# Patient Record
Sex: Male | Born: 1935 | Race: White | Hispanic: No | Marital: Married | Smoking: Former smoker
Health system: Southern US, Community
[De-identification: ages and names within clinical notes are randomized; demographics above are authoritative.]

## PROBLEM LIST (undated history)

## (undated) DIAGNOSIS — I10 Essential (primary) hypertension: Secondary | ICD-10-CM

## (undated) DIAGNOSIS — R5381 Other malaise: Secondary | ICD-10-CM

## (undated) DIAGNOSIS — C189 Malignant neoplasm of colon, unspecified: Secondary | ICD-10-CM

## (undated) DIAGNOSIS — E785 Hyperlipidemia, unspecified: Secondary | ICD-10-CM

## (undated) DIAGNOSIS — E039 Hypothyroidism, unspecified: Secondary | ICD-10-CM

## (undated) DIAGNOSIS — G610 Guillain-Barre syndrome: Secondary | ICD-10-CM

## (undated) DIAGNOSIS — L851 Acquired keratosis [keratoderma] palmaris et plantaris: Secondary | ICD-10-CM

## (undated) DIAGNOSIS — K219 Gastro-esophageal reflux disease without esophagitis: Secondary | ICD-10-CM

## (undated) DIAGNOSIS — R5383 Other fatigue: Secondary | ICD-10-CM

## (undated) DIAGNOSIS — D649 Anemia, unspecified: Secondary | ICD-10-CM

## (undated) DIAGNOSIS — D126 Benign neoplasm of colon, unspecified: Secondary | ICD-10-CM

## (undated) DIAGNOSIS — K5792 Diverticulitis of intestine, part unspecified, without perforation or abscess without bleeding: Secondary | ICD-10-CM

## (undated) HISTORY — DX: Gastro-esophageal reflux disease without esophagitis: K21.9

## (undated) HISTORY — DX: Diverticulitis of intestine, part unspecified, without perforation or abscess without bleeding: K57.92

## (undated) HISTORY — DX: Essential (primary) hypertension: I10

## (undated) HISTORY — DX: Hypothyroidism, unspecified: E03.9

## (undated) HISTORY — DX: Acquired keratosis (keratoderma) palmaris et plantaris: L85.1

## (undated) HISTORY — DX: Malignant neoplasm of colon, unspecified: C18.9

## (undated) HISTORY — DX: Guillain-Barre syndrome: G61.0

## (undated) HISTORY — PX: HERNIA REPAIR: SHX51

## (undated) HISTORY — DX: Anemia, unspecified: D64.9

## (undated) HISTORY — PX: HEMORRHOID SURGERY: SHX153

## (undated) HISTORY — PX: OMENTECTOMY: SHX2098

## (undated) HISTORY — DX: Other malaise: R53.81

## (undated) HISTORY — DX: Other fatigue: R53.83

## (undated) HISTORY — DX: Hyperlipidemia, unspecified: E78.5

## (undated) HISTORY — PX: CATARACT EXTRACTION: SUR2

## (undated) HISTORY — DX: Benign neoplasm of colon, unspecified: D12.6

---
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
## 1966-01-16 HISTORY — PX: CERVICAL LAMINECTOMY: SHX94

## 1968-01-17 HISTORY — PX: LUMBAR LAMINECTOMY: SHX95

## 2004-09-26 ENCOUNTER — Ambulatory Visit (HOSPITAL_COMMUNITY): Admission: RE | Admit: 2004-09-26 | Discharge: 2004-09-26 | Payer: Self-pay | Admitting: General Surgery

## 2008-05-16 DIAGNOSIS — C189 Malignant neoplasm of colon, unspecified: Secondary | ICD-10-CM

## 2008-05-16 HISTORY — DX: Malignant neoplasm of colon, unspecified: C18.9

## 2008-05-20 ENCOUNTER — Ambulatory Visit: Payer: Self-pay | Admitting: Family Medicine

## 2008-05-20 DIAGNOSIS — E785 Hyperlipidemia, unspecified: Secondary | ICD-10-CM

## 2008-05-20 DIAGNOSIS — I10 Essential (primary) hypertension: Secondary | ICD-10-CM | POA: Insufficient documentation

## 2008-05-20 DIAGNOSIS — D649 Anemia, unspecified: Secondary | ICD-10-CM | POA: Insufficient documentation

## 2008-05-20 DIAGNOSIS — E039 Hypothyroidism, unspecified: Secondary | ICD-10-CM | POA: Insufficient documentation

## 2008-05-20 DIAGNOSIS — K921 Melena: Secondary | ICD-10-CM | POA: Insufficient documentation

## 2008-05-20 HISTORY — DX: Hyperlipidemia, unspecified: E78.5

## 2008-05-20 HISTORY — DX: Essential (primary) hypertension: I10

## 2008-05-20 LAB — CONVERTED CEMR LAB
ALT: 13 units/L (ref 0–53)
AST: 20 units/L (ref 0–37)
Alkaline Phosphatase: 97 units/L (ref 39–117)
BUN: 25 mg/dL — ABNORMAL HIGH (ref 6–23)
Basophils Absolute: 0.1 10*3/uL (ref 0.0–0.1)
CO2: 26 meq/L (ref 19–32)
Calcium: 8.7 mg/dL (ref 8.4–10.5)
Chloride: 112 meq/L (ref 96–112)
Creatinine, Ser: 1 mg/dL (ref 0.4–1.5)
Eosinophils Absolute: 0.4 10*3/uL (ref 0.0–0.7)
Eosinophils Relative: 5.4 % — ABNORMAL HIGH (ref 0.0–5.0)
Glucose, Bld: 89 mg/dL (ref 70–99)
HCT: 27.9 % — ABNORMAL LOW (ref 39.0–52.0)
HDL: 47.8 mg/dL (ref >39.00)
LDL Cholesterol: 72 mg/dL (ref 0–99)
Lymphocytes Relative: 22.2 % (ref 12.0–46.0)
Monocytes Absolute: 0.9 10*3/uL (ref 0.1–1.0)
Monocytes Relative: 12.5 % — ABNORMAL HIGH (ref 3.0–12.0)
Neutrophils Relative %: 58.8 % (ref 43.0–77.0)
Sodium: 143 meq/L (ref 135–145)
TSH: 5.99 microintl units/mL — ABNORMAL HIGH (ref 0.35–5.50)
Total Bilirubin: 0.5 mg/dL (ref 0.3–1.2)

## 2008-05-22 ENCOUNTER — Ambulatory Visit: Payer: Self-pay | Admitting: Family Medicine

## 2008-05-25 ENCOUNTER — Telehealth: Payer: Self-pay | Admitting: Gastroenterology

## 2008-05-25 ENCOUNTER — Ambulatory Visit: Payer: Self-pay | Admitting: Gastroenterology

## 2008-05-25 DIAGNOSIS — R195 Other fecal abnormalities: Secondary | ICD-10-CM | POA: Insufficient documentation

## 2008-05-25 DIAGNOSIS — Z860100 Personal history of colon polyps, unspecified: Secondary | ICD-10-CM | POA: Insufficient documentation

## 2008-05-25 DIAGNOSIS — K219 Gastro-esophageal reflux disease without esophagitis: Secondary | ICD-10-CM

## 2008-05-25 DIAGNOSIS — D509 Iron deficiency anemia, unspecified: Secondary | ICD-10-CM | POA: Insufficient documentation

## 2008-05-25 HISTORY — DX: Gastro-esophageal reflux disease without esophagitis: K21.9

## 2008-05-25 LAB — CONVERTED CEMR LAB: Ferritin: 10 ng/mL — ABNORMAL LOW (ref 22.0–322.0)

## 2008-05-26 ENCOUNTER — Telehealth: Payer: Self-pay | Admitting: Gastroenterology

## 2008-06-03 ENCOUNTER — Encounter: Payer: Self-pay | Admitting: Gastroenterology

## 2008-06-03 ENCOUNTER — Ambulatory Visit: Payer: Self-pay | Admitting: Gastroenterology

## 2008-06-04 ENCOUNTER — Telehealth: Payer: Self-pay | Admitting: Gastroenterology

## 2008-06-04 DIAGNOSIS — C184 Malignant neoplasm of transverse colon: Secondary | ICD-10-CM | POA: Insufficient documentation

## 2008-06-09 ENCOUNTER — Ambulatory Visit: Payer: Self-pay | Admitting: Cardiology

## 2008-06-12 ENCOUNTER — Telehealth: Payer: Self-pay | Admitting: Gastroenterology

## 2008-06-12 ENCOUNTER — Ambulatory Visit: Payer: Self-pay | Admitting: Gastroenterology

## 2008-06-12 LAB — CONVERTED CEMR LAB
Basophils Absolute: 0.1 10*3/uL (ref 0.0–0.1)
Eosinophils Absolute: 0.5 10*3/uL (ref 0.0–0.7)
Hemoglobin: 8.8 g/dL — ABNORMAL LOW (ref 13.0–17.0)
Lymphs Abs: 1.6 10*3/uL (ref 0.7–4.0)
MCHC: 32.2 g/dL (ref 30.0–36.0)
MCV: 80.3 fL (ref 78.0–100.0)
Monocytes Absolute: 0.9 10*3/uL (ref 0.1–1.0)
Neutro Abs: 4.6 10*3/uL (ref 1.4–7.7)
Neutrophils Relative %: 60.2 % (ref 43.0–77.0)
RDW: 15.2 % — ABNORMAL HIGH (ref 11.5–14.6)
WBC: 7.7 10*3/uL (ref 4.5–10.5)

## 2008-06-16 ENCOUNTER — Encounter (HOSPITAL_COMMUNITY): Admission: RE | Admit: 2008-06-16 | Discharge: 2008-09-01 | Payer: Self-pay | Admitting: Gastroenterology

## 2008-06-23 ENCOUNTER — Encounter: Payer: Self-pay | Admitting: Gastroenterology

## 2008-07-03 ENCOUNTER — Ambulatory Visit: Payer: Self-pay | Admitting: Gastroenterology

## 2008-07-03 ENCOUNTER — Telehealth: Payer: Self-pay | Admitting: Gastroenterology

## 2008-07-03 LAB — CONVERTED CEMR LAB
Basophils Absolute: 0.1 10*3/uL (ref 0.0–0.1)
Basophils Relative: 0.7 % (ref 0.0–3.0)
Eosinophils Relative: 4.5 % (ref 0.0–5.0)
HCT: 34.8 % — ABNORMAL LOW (ref 39.0–52.0)
Hemoglobin: 11.7 g/dL — ABNORMAL LOW (ref 13.0–17.0)
Lymphs Abs: 1.7 10*3/uL (ref 0.7–4.0)
MCHC: 33.5 g/dL (ref 30.0–36.0)
MCV: 83.2 fL (ref 78.0–100.0)
Monocytes Absolute: 1.2 10*3/uL — ABNORMAL HIGH (ref 0.1–1.0)
Neutrophils Relative %: 60.6 % (ref 43.0–77.0)
Platelets: 334 10*3/uL (ref 150.0–400.0)
RBC: 4.18 M/uL — ABNORMAL LOW (ref 4.22–5.81)

## 2008-07-13 ENCOUNTER — Ambulatory Visit: Payer: Self-pay | Admitting: Family Medicine

## 2008-07-16 HISTORY — PX: COLECTOMY: SHX59

## 2008-07-21 ENCOUNTER — Encounter (INDEPENDENT_AMBULATORY_CARE_PROVIDER_SITE_OTHER): Payer: Self-pay | Admitting: General Surgery

## 2008-07-21 ENCOUNTER — Encounter: Payer: Self-pay | Admitting: Gastroenterology

## 2008-07-21 ENCOUNTER — Inpatient Hospital Stay (HOSPITAL_COMMUNITY): Admission: RE | Admit: 2008-07-21 | Discharge: 2008-07-29 | Payer: Self-pay | Admitting: General Surgery

## 2008-08-24 ENCOUNTER — Ambulatory Visit: Payer: Self-pay | Admitting: Oncology

## 2008-08-25 ENCOUNTER — Encounter: Payer: Self-pay | Admitting: Gastroenterology

## 2008-08-25 LAB — COMPREHENSIVE METABOLIC PANEL
ALT: 20 U/L (ref 0–53)
AST: 17 U/L (ref 0–37)
Alkaline Phosphatase: 100 U/L (ref 39–117)
Sodium: 136 mEq/L (ref 135–145)
Total Bilirubin: 0.4 mg/dL (ref 0.3–1.2)
Total Protein: 7.4 g/dL (ref 6.0–8.3)

## 2008-08-25 LAB — CBC WITH DIFFERENTIAL/PLATELET
BASO%: 0.6 % (ref 0.0–2.0)
HCT: 35.7 % — ABNORMAL LOW (ref 38.4–49.9)
MCHC: 32.2 g/dL (ref 32.0–36.0)
MONO#: 0.9 10*3/uL (ref 0.1–0.9)
NEUT#: 3.8 10*3/uL (ref 1.5–6.5)
NEUT%: 55.8 % (ref 39.0–75.0)
RBC: 4.14 10*6/uL — ABNORMAL LOW (ref 4.20–5.82)
WBC: 6.9 10*3/uL (ref 4.0–10.3)
lymph#: 1.8 10*3/uL (ref 0.9–3.3)

## 2008-09-23 ENCOUNTER — Encounter (INDEPENDENT_AMBULATORY_CARE_PROVIDER_SITE_OTHER): Payer: Self-pay | Admitting: Occupational Medicine

## 2008-10-14 ENCOUNTER — Ambulatory Visit: Payer: Self-pay | Admitting: Family Medicine

## 2008-10-14 LAB — CONVERTED CEMR LAB
ALT: 14 units/L (ref 0–53)
AST: 21 units/L (ref 0–37)
Albumin: 3.6 g/dL (ref 3.5–5.2)
Alkaline Phosphatase: 92 units/L (ref 39–117)
Basophils Relative: 0.5 % (ref 0.0–3.0)
Chloride: 107 meq/L (ref 96–112)
Creatinine, Ser: 1 mg/dL (ref 0.4–1.5)
Eosinophils Absolute: 0.2 10*3/uL (ref 0.0–0.7)
GFR calc non Af Amer: 76.37 mL/min (ref >60)
Glucose, Bld: 89 mg/dL (ref 70–99)
Lymphs Abs: 1.8 10*3/uL (ref 0.7–4.0)
MCHC: 33.8 g/dL (ref 30.0–36.0)
MCV: 90 fL (ref 78.0–100.0)
Neutro Abs: 2.9 10*3/uL (ref 1.4–7.7)
Neutrophils Relative %: 51.4 % (ref 43.0–77.0)
Platelets: 312 10*3/uL (ref 150.0–400.0)
RBC: 3.86 M/uL — ABNORMAL LOW (ref 4.22–5.81)
RDW: 14 % (ref 11.5–14.6)
Sodium: 138 meq/L (ref 135–145)
TSH: 33.53 microintl units/mL — ABNORMAL HIGH (ref 0.35–5.50)
Total Bilirubin: 0.8 mg/dL (ref 0.3–1.2)
WBC: 5.6 10*3/uL (ref 4.5–10.5)

## 2008-10-21 ENCOUNTER — Ambulatory Visit: Payer: Self-pay | Admitting: Family Medicine

## 2008-10-21 DIAGNOSIS — L851 Acquired keratosis [keratoderma] palmaris et plantaris: Secondary | ICD-10-CM | POA: Insufficient documentation

## 2008-10-21 HISTORY — DX: Acquired keratosis (keratoderma) palmaris et plantaris: L85.1

## 2008-12-11 ENCOUNTER — Ambulatory Visit: Payer: Self-pay | Admitting: Oncology

## 2008-12-15 ENCOUNTER — Encounter: Payer: Self-pay | Admitting: Gastroenterology

## 2008-12-15 LAB — COMPREHENSIVE METABOLIC PANEL
BUN: 21 mg/dL (ref 6–23)
CO2: 23 mEq/L (ref 19–32)
Glucose, Bld: 86 mg/dL (ref 70–99)
Sodium: 140 mEq/L (ref 135–145)
Total Bilirubin: 0.6 mg/dL (ref 0.3–1.2)
Total Protein: 7.3 g/dL (ref 6.0–8.3)

## 2008-12-15 LAB — CBC WITH DIFFERENTIAL/PLATELET
Basophils Absolute: 0 10*3/uL (ref 0.0–0.1)
Eosinophils Absolute: 0.2 10*3/uL (ref 0.0–0.5)
HCT: 35.1 % — ABNORMAL LOW (ref 38.4–49.9)
HGB: 11.6 g/dL — ABNORMAL LOW (ref 13.0–17.1)
LYMPH%: 33.4 % (ref 14.0–49.0)
MCV: 88.6 fL (ref 79.3–98.0)
MONO#: 0.8 10*3/uL (ref 0.1–0.9)
NEUT#: 4.2 10*3/uL (ref 1.5–6.5)
NEUT%: 53.5 % (ref 39.0–75.0)
Platelets: 334 10*3/uL (ref 140–400)
RBC: 3.96 10*6/uL — ABNORMAL LOW (ref 4.20–5.82)
WBC: 7.8 10*3/uL (ref 4.0–10.3)

## 2008-12-15 LAB — CEA: CEA: 2.3 ng/mL (ref 0.0–5.0)

## 2009-01-27 ENCOUNTER — Encounter: Payer: Self-pay | Admitting: Gastroenterology

## 2009-03-01 ENCOUNTER — Ambulatory Visit: Payer: Self-pay | Admitting: Oncology

## 2009-03-03 ENCOUNTER — Ambulatory Visit (HOSPITAL_COMMUNITY): Admission: RE | Admit: 2009-03-03 | Discharge: 2009-03-03 | Payer: Self-pay | Admitting: Oncology

## 2009-03-03 LAB — COMPREHENSIVE METABOLIC PANEL
Alkaline Phosphatase: 90 U/L (ref 39–117)
BUN: 20 mg/dL (ref 6–23)
CO2: 25 mEq/L (ref 19–32)
Chloride: 105 mEq/L (ref 96–112)
Sodium: 136 mEq/L (ref 135–145)
Total Protein: 7.7 g/dL (ref 6.0–8.3)

## 2009-03-03 LAB — CBC WITH DIFFERENTIAL/PLATELET
BASO%: 0.6 % (ref 0.0–2.0)
EOS%: 3.9 % (ref 0.0–7.0)
HCT: 34.4 % — ABNORMAL LOW (ref 38.4–49.9)
HGB: 11.6 g/dL — ABNORMAL LOW (ref 13.0–17.1)
LYMPH%: 25.9 % (ref 14.0–49.0)
MCH: 30.1 pg (ref 27.2–33.4)
NEUT#: 3.7 10*3/uL (ref 1.5–6.5)
Platelets: 303 10*3/uL (ref 140–400)
RDW: 15 % — ABNORMAL HIGH (ref 11.0–14.6)

## 2009-03-03 LAB — CEA: CEA: 3.2 ng/mL (ref 0.0–5.0)

## 2009-03-24 ENCOUNTER — Encounter: Payer: Self-pay | Admitting: Gastroenterology

## 2009-04-26 ENCOUNTER — Encounter: Payer: Self-pay | Admitting: Gastroenterology

## 2009-05-13 ENCOUNTER — Ambulatory Visit: Payer: Self-pay | Admitting: Family Medicine

## 2009-05-13 DIAGNOSIS — R5381 Other malaise: Secondary | ICD-10-CM | POA: Insufficient documentation

## 2009-05-13 DIAGNOSIS — R5383 Other fatigue: Secondary | ICD-10-CM

## 2009-05-13 HISTORY — DX: Other malaise: R53.81

## 2009-05-17 LAB — CONVERTED CEMR LAB
Basophils Absolute: 0 10*3/uL (ref 0.0–0.1)
CEA: 4.6 ng/mL (ref 0.0–5.0)
CO2: 30 meq/L (ref 19–32)
Eosinophils Absolute: 0.3 10*3/uL (ref 0.0–0.7)
Eosinophils Relative: 3.7 % (ref 0.0–5.0)
Glucose, Bld: 82 mg/dL (ref 70–99)
HCT: 35.4 % — ABNORMAL LOW (ref 39.0–52.0)
Monocytes Relative: 11.1 % (ref 3.0–12.0)
Platelets: 320 10*3/uL (ref 150.0–400.0)
RBC: 3.83 M/uL — ABNORMAL LOW (ref 4.22–5.81)
RDW: 15.6 % — ABNORMAL HIGH (ref 11.5–14.6)
Sodium: 140 meq/L (ref 135–145)
TSH: 7.3 microintl units/mL — ABNORMAL HIGH (ref 0.35–5.50)
WBC: 7.1 10*3/uL (ref 4.5–10.5)

## 2009-06-02 ENCOUNTER — Ambulatory Visit: Payer: Self-pay | Admitting: Family Medicine

## 2009-06-08 ENCOUNTER — Encounter (INDEPENDENT_AMBULATORY_CARE_PROVIDER_SITE_OTHER): Payer: Self-pay | Admitting: *Deleted

## 2009-08-02 ENCOUNTER — Ambulatory Visit: Payer: Self-pay | Admitting: Oncology

## 2009-08-04 ENCOUNTER — Ambulatory Visit: Payer: Self-pay | Admitting: Family Medicine

## 2009-08-04 DIAGNOSIS — K432 Incisional hernia without obstruction or gangrene: Secondary | ICD-10-CM | POA: Insufficient documentation

## 2009-08-04 DIAGNOSIS — I951 Orthostatic hypotension: Secondary | ICD-10-CM | POA: Insufficient documentation

## 2009-08-25 ENCOUNTER — Ambulatory Visit: Payer: Self-pay | Admitting: Family Medicine

## 2009-08-26 LAB — CONVERTED CEMR LAB: TSH: 18.87 microintl units/mL — ABNORMAL HIGH (ref 0.35–5.50)

## 2009-09-01 ENCOUNTER — Ambulatory Visit: Payer: Self-pay | Admitting: Family Medicine

## 2009-09-22 ENCOUNTER — Telehealth: Payer: Self-pay | Admitting: Gastroenterology

## 2009-09-22 ENCOUNTER — Ambulatory Visit: Payer: Self-pay | Admitting: Gastroenterology

## 2009-09-22 DIAGNOSIS — Z85038 Personal history of other malignant neoplasm of large intestine: Secondary | ICD-10-CM | POA: Insufficient documentation

## 2009-10-16 DIAGNOSIS — D126 Benign neoplasm of colon, unspecified: Secondary | ICD-10-CM

## 2009-10-16 HISTORY — DX: Benign neoplasm of colon, unspecified: D12.6

## 2009-10-22 ENCOUNTER — Telehealth: Payer: Self-pay | Admitting: Gastroenterology

## 2009-10-22 ENCOUNTER — Telehealth: Payer: Self-pay | Admitting: Family Medicine

## 2009-10-26 ENCOUNTER — Ambulatory Visit: Payer: Self-pay | Admitting: Gastroenterology

## 2009-10-31 ENCOUNTER — Encounter: Payer: Self-pay | Admitting: Gastroenterology

## 2009-12-15 ENCOUNTER — Ambulatory Visit: Payer: Self-pay | Admitting: Family Medicine

## 2009-12-16 LAB — CONVERTED CEMR LAB: TSH: 11.34 microintl units/mL — ABNORMAL HIGH (ref 0.35–5.50)

## 2009-12-22 ENCOUNTER — Ambulatory Visit: Payer: Self-pay | Admitting: Family Medicine

## 2010-02-06 ENCOUNTER — Encounter: Payer: Self-pay | Admitting: Oncology

## 2010-02-15 NOTE — Progress Notes (Signed)
Summary: Switch prep   Phone Note Outgoing Call Call back at Work Phone [phone]   Call placed by: Christie Nottingham CMA Duncan Dull),  September 22, 2009 2:24 PM Call placed to: Patient Summary of Call: Called pt to tell him that we recieved the fax from his pharmacy stating that his insurance will not cover the Movi prep and he wanted a substituion. I called the pt and told him that we have mail in rebate coupon for the Movi prep or we could switch him to a Golytely prep. Pt states he has done the Golytely prep in the past and would like that prep instead. Informed pharmacy of the change in prep and mailed the new prep instructions to the pt. Told the pt to call back if he has any questions about the new prep instructions.  Initial call taken by: Christie Nottingham CMA Duncan Dull),  September 22, 2009 2:28 PM

## 2010-02-15 NOTE — Procedures (Signed)
Summary: Colonoscopy  Patient: Ronald Gutierrez Note: All result statuses are Final unless otherwise noted.  Tests: (1) Colonoscopy (COL)   COL Colonoscopy           DONE     [facility]     [address]     [place], [place]  [address]           COLONOSCOPY PROCEDURE REPORT     PATIENT:  Ronald Gutierrez  MR#:  #[id number]     BIRTHDATE:  09-Feb-1926, 83 yrs. old  GENDER:  male     ENDOSCOPIST:  Judie Petit T. Russella Dar, MD, [facility]           PROCEDURE DATE:  10/26/2009     PROCEDURE:  Colonoscopy with snare polypectomy, with submucosal     injection     ASA CLASS:  Class II     INDICATIONS:  follow-up of cancer , surveillance and high-risk     screening: T4 colon adenocarcinoma, 05/2008.     MEDICATIONS:   Fentanyl 50 mcg IV, Versed 8 mg IV     DESCRIPTION OF PROCEDURE:   After the risks benefits and     alternatives of the procedure were thoroughly explained, informed     consent was obtained.  Digital rectal exam was performed and     revealed no abnormalities.   The LB [id number]-[id number] endoscope     was introduced through the anus and advanced to the cecum, which     was identified by both the appendix and ileocecal valve, limited     by fair prep.    The quality of the prep was Moviprep fair.  The     instrument was then slowly withdrawn as the colon was fully     examined.     <<PROCEDUREIMAGES>>     FINDINGS:  A sessile polyp was found at the hepatic flexure. It     was 15 mm in size. Polyp was snared, then cauterized with     monopolar cautery. Retrieval was successful. Piecemeal     polypectomy.  Normal colon otherwise at the hepatic flexure with 2     cc submucosal injection [place] ink tattoo adjacent to the     polypectomy site.  Four polyps were found in the proximal     transverse colon. They were 4 - 12 mm in size. Polyps were snared,     then cauterized with monopolar cautery. Retrieval was successful.     There was evidence of a prior segmental colectomy in the mid  transverse colon.  Moderate diverticulosis was found in the     sigmoid to descending colon.  A sessile polyp was found in the     rectum. It was 5 mm in size. Polyp was snared without cautery.     Retrieval was successful.  This was otherwise a normal examination     of the colon.  Retroflexed views in the rectum revealed internal     hemorrhoids     , moderate.  The time to cecum =  1.75  minutes. The scope was     then withdrawn (time =  18  min) from the patient and the     procedure completed.           COMPLICATIONS:  None           ENDOSCOPIC IMPRESSION:     1) 15 mm sessile polyp at the hepatic flexure     2)  Internal hemorrhoids     3) 4 - 12 mm Four polyps in the proximal transverse colon     4) Prior segmental colectomy in the mid transverse colon     5) Moderate diverticulosis in the sigmoid to descending colon     6) 5 mm sessile polyp in the rectum           RECOMMENDATIONS:     1) Await pathology results     2) High fiber diet with liberal fluid intake.     3) Repeat Colonoscopy in 1 year pending pathology review.     4) No ASA/NSAIDs for 2 weeks           Malcolm T. Russella Dar, MD, [facility]           CC: Evelena Peat, MD           n.     Rosalie DoctorVenita Lick. Stark at 10/26/2009 03:12 PM           Margarita Rana, #[id number]  Note: An exclamation mark (!) indicates a result that was not dispersed into the flowsheet. Document Creation Date: 10/26/2009 3:13 PM _______________________________________________________________________  (1) Order result status: Final Collection or observation date-time: 10/26/2009 15:05 Requested date-time:  Receipt date-time:  Reported date-time:  Referring Physician:   Ordering Physician: Claudette Head [phone]) Specimen Source:  Source: Launa Grill Order Number: [id number] Lab site:   Appended Document: Colonoscopy     Procedures Next Due Date:    Colonoscopy: 10/2010

## 2010-02-15 NOTE — Letter (Signed)
Summary: [facility] Instructions  [facility]  [address] [place], [place] [address]   Phone: [phone]  Fax: [phone]       Ronald Gutierrez    28-Nov-1926    MRN: [id number]        Procedure Day /Date: Tuesday October 11th, 2011     Arrival Time: 1:30pm     Procedure Time: 2:30pm     Location of Procedure:                    _ x_  [facility] (4th Floor)                         PREPARATION FOR COLONOSCOPY WITH MOVIPREP   Starting 5 days prior to your procedure 10/21/09 do not eat nuts, seeds, popcorn, corn, beans, peas,  salads, or any raw vegetables.  Do not take any fiber supplements (e.g. Metamucil, Citrucel, and Benefiber).  THE DAY BEFORE YOUR PROCEDURE         DATE: 10/25/09  DAY: Monday  1.  Drink clear liquids the entire day-NO SOLID FOOD  2.  Do not drink anything colored red or purple.  Avoid juices with pulp.  No orange juice.  3.  Drink at least 64 oz. (8 glasses) of fluid/clear liquids during the day to prevent dehydration and help the prep work efficiently.  CLEAR LIQUIDS INCLUDE: Water Jello Ice Popsicles Tea (sugar ok, no milk/cream) Powdered fruit flavored drinks Coffee (sugar ok, no milk/cream) Gatorade Juice: apple, white grape, white cranberry  Lemonade Clear bullion, consomm, broth Carbonated beverages (any kind) Strained chicken noodle soup Hard Candy                             4.  In the morning, mix first dose of MoviPrep solution:    Empty 1 Pouch A and 1 Pouch B into the disposable container    Add lukewarm drinking water to the top line of the container. Mix to dissolve    Refrigerate (mixed solution should be used within 24 hrs)  5.  Begin drinking the prep at 5:00 p.m. The MoviPrep container is divided by 4 marks.   Every 15 minutes drink the solution down to the next mark (approximately 8 oz) until the full liter is complete.   6.  Follow completed prep with 16 oz of clear liquid of your choice  (Nothing red or purple).  Continue to drink clear liquids until bedtime.  7.  Before going to bed, mix second dose of MoviPrep solution:    Empty 1 Pouch A and 1 Pouch B into the disposable container    Add lukewarm drinking water to the top line of the container. Mix to dissolve    Refrigerate  THE DAY OF YOUR PROCEDURE      DATE: 10/26/09 DAY: Tuesday  Beginning at 9:30a.m. (5 hours before procedure):         1. Every 15 minutes, drink the solution down to the next mark (approx 8 oz) until the full liter is complete.  2. Follow completed prep with 16 oz. of clear liquid of your choice.    3. You may drink clear liquids until 12:30pm (2 HOURS BEFORE PROCEDURE).   MEDICATION INSTRUCTIONS  Unless otherwise instructed, you should take regular prescription medications with a small sip of water   as early as possible the morning of your  procedure.         OTHER INSTRUCTIONS  You will need a responsible adult at least 76 years of age to accompany you and drive you home.   This person must remain in the waiting room during your procedure.  Wear loose fitting clothing that is easily removed.  Leave jewelry and other valuables at home.  However, you may wish to bring a book to read or  an iPod/MP3 player to listen to music as you wait for your procedure to start.  Remove all body piercing jewelry and leave at home.  Total time from sign-in until discharge is approximately 2-3 hours.  You should go home directly after your procedure and rest.  You can resume normal activities the  day after your procedure.  The day of your procedure you should not:   Drive   Make legal decisions   Operate machinery   Drink alcohol   Return to work  You will receive specific instructions about eating, activities and medications before you leave.    The above instructions have been reviewed and explained to me by   Estill Bamberg.     I fully understand and can verbalize these  instructions _____________________________ Date _________

## 2010-02-15 NOTE — Assessment & Plan Note (Signed)
Summary: 2 WK ROV // RS   Vital Signs:  Patient profile:   77 year old male Weight:      196 pounds Temp:     97.7 degrees F oral BP sitting:   140 / 80  (left arm) Cuff size:   regular  Vitals Entered By: Sid Falcon LPN (Jun 02, 2009 8:24 AM) CC: 2 week follow-up   History of Present Illness: Follow up fatigue.  TSH was high.  B12 normal.  CEA < 5.   Hgb 11.9 with normal MCV. Appetite and weight stable.  Occ constipation.  Has actually had some steady weight gain since colon surgery.  Thyroid med increased and fatigue somewhat improved.  Golf 4 times/week.  No chest pain or dyspnea.  Pt needs to be scheduled for repeat colonoscopy.  Allergies: 1)  ! Ferrous Sulfate (Ferrous Sulfate) 2)  Lead Oxide (Lead Oxide)  Past History:  Past Medical History: Last updated: 10/21/2008 Hyperlipidemia Hypertension Hypothyroidism Colon polyps Anemia  2009 Diverticulitis Allergy/Hay Fever GERD Colon Cancer 5/10 T4N0 HX Guillain Barre' Syndrome  Past Surgical History: Last updated: 05/13/2009 Inguinal herniorrhaphy X 5 Appendectomy 1948 Cervical laminectomy  1968 Lumbar laminectomy   1970 Crushed left heel 1985 Hemorrhoid surgery Partial Colectomy 7/10 Transverse Colon  Review of Systems  The patient denies anorexia, weight loss, abdominal pain, melena, hematochezia, and severe indigestion/heartburn.    Physical Exam  General:  Well-developed,well-nourished,in no acute distress; alert,appropriate and cooperative throughout examination Neck:  No deformities, masses, or tenderness noted. Lungs:  Normal respiratory effort, chest expands symmetrically. Lungs are clear to auscultation, no crackles or wheezes. Heart:  Normal rate and regular rhythm. S1 and S2 normal without gallop, murmur, click, rub or other extra sounds. Extremities:  No clubbing, cyanosis, edema, or deformity noted with normal full range of motion of all joints.     Impression &  Recommendations:  Problem # 1:  HYPOTHYROIDISM (ICD-244.9) repeat TSH in 3 months. The following medications were removed from the medication list:    Levothroid 112 Mcg Tabs (Levothyroxine sodium) ..... One by mouth once daily His updated medication list for this problem includes:    Levothroid 125 Mcg Tabs (Levothyroxine sodium) ..... 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 Orders: Gastroenterology Referral (GI)  Complete Medication List: 1)  Lisinopril 40 Mg Tabs (Lisinopril) .... Once daily 2)  Simvastatin 40 Mg Tabs (Simvastatin) .... One half by mouth once daily 3)  Cvs Loratadine 10 Mg Tabs (Loratadine) .... Once daily 4)  Adult Aspirin Low Strength 81 Mg Tbdp (Aspirin) .... Once daily 5)  Centrum Ultra Mens Tabs (Multiple vitamins-minerals) .... Once daily 6)  Omeprazole 20 Mg Cpdr (Omeprazole) .... One tablet by mouth once daily 7)  Repliva 21/7  .... One tablet by mouth once daily after a meal. 8)  Levothroid 125 Mcg Tabs (Levothyroxine sodium) .... Once daily  Patient Instructions: 1)  Please schedule a follow-up appointment in 3 months .  2)  TSH prior to visit ICD-9 : 244.9

## 2010-02-15 NOTE — Procedures (Signed)
Summary: Recall Assessment/[facility]  Recall Assessment/[facility]   Imported By: Sherian Rein 08/31/2009 10:12:33  _____________________________________________________________________  External Attachment:    Type:   Image     Comment:   External Document

## 2010-02-15 NOTE — Assessment & Plan Note (Signed)
Summary: 3 month ov//ccm   Vital Signs:  Patient profile:   77 year old male Weight:      200 pounds Temp:     98 degrees F oral BP sitting:   140 / 80  (left arm) Cuff size:   regular  Vitals Entered By: Sid Falcon LPN (September 01, 2009 8:21 AM)  History of Present Illness: Patient seen for the following  Patient has history of hypertension. Had mild orthostatic symptoms on lisinopril 40 mg. Dosage reduced to 20 mg. Resolution of orthostatic symptoms. Drinking plenty of fluids. Played golf 5 times last week. Blood pressures reviewed and home readings are stable. Mostly 120s to 130 systolic. Has taken blood pressure standing on a couple occasions and this has not dropped any.  Hypothyroidism under replaced. Recent titration of Levothroid to 137 micrograms. Overall his fatigue is improving. Good appetite. Weight stable.  History colon cancer. Scheduled colonoscopy in 2 weeks. Occasional dark stools but no bloody stools Incisional hernia slightly larger in size recently.  Occ fleeting pain with golf-?related to overstretching scar tissue.    Allergies: 1)  ! Ferrous Sulfate (Ferrous Sulfate) 2)  Lead Oxide (Lead Oxide)  Past History:  Past Medical History: Last updated: 10/21/2008 Hyperlipidemia Hypertension Hypothyroidism Colon polyps Anemia  2009 Diverticulitis Allergy/Hay Fever GERD Colon Cancer 5/10 T4N0 HX Guillain Barre' Syndrome  Past Surgical History: Last updated: 05/13/2009 Inguinal herniorrhaphy X 5 Appendectomy 1948 Cervical laminectomy  1968 Lumbar laminectomy   1970 Crushed left heel 1985 Hemorrhoid surgery Partial Colectomy 7/10 Transverse Colon  Family History: Last updated: 05/25/2008 Stroke, father Lung cancer, older brother Heart disease, 2 brothers Family History of Prostate Cancer:2 brothers No FH of Colon Cancer:  Social History: Last updated: 05/25/2008 Retired Married Alcohol use-yes Smoked 45 years ago Illicit Drug Use -  no PMH-FH-SH reviewed for relevance  Review of Systems  The patient denies anorexia, fever, weight loss, chest pain, syncope, dyspnea on exertion, peripheral edema, headaches, abdominal pain, melena, hematochezia, and severe indigestion/heartburn.    Physical Exam  General:  Well-developed,well-nourished,in no acute distress; alert,appropriate and cooperative throughout examination Head:  Normocephalic and atraumatic without obvious abnormalities. No apparent alopecia or balding. Ears:  External ear exam shows no significant lesions or deformities.  Otoscopic examination reveals clear canals, tympanic membranes are intact bilaterally without bulging, retraction, inflammation or discharge. Hearing is grossly normal bilaterally. Mouth:  Oral mucosa and oropharynx without lesions or exudates.  Teeth in good repair. Neck:  No deformities, masses, or tenderness noted. Lungs:  Normal respiratory effort, chest expands symmetrically. Lungs are clear to auscultation, no crackles or wheezes. Heart:  normal rate and regular rhythm.   Abdomen:  soft, non-tender, normal bowel sounds, no hepatomegaly, and no splenomegaly.  Incisional/ventral hernia nontender.   Impression & Recommendations:  Problem # 1:  HYPOTENSION, ORTHOSTATIC (ICD-458.0) Assessment Improved BP improved with reduced dose lisinopril  Problem # 2:  INCISIONAL HERNIA (ICD-553.21)  Problem # 3:  HYPOTHYROIDISM (ICD-244.9) repeat labs 3 months. His updated medication list for this problem includes:    Levothroid 137 Mcg Tabs (Levothyroxine sodium) .Marland Kitchen... 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  Complete Medication List: 1)  Lisinopril 40 Mg Tabs (Lisinopril) .... One half tablet daily 2)  Simvastatin 40 Mg Tabs (Simvastatin) .... One half by mouth once daily 3)  Cvs Loratadine 10 Mg Tabs (Loratadine) .... Once daily 4)  Adult Aspirin Low Strength 81  Mg  Tbdp (Aspirin) .... Once daily 5)  Centrum Ultra Mens Tabs (Multiple vitamins-minerals) .... Once daily 6)  Omeprazole 20 Mg Cpdr (Omeprazole) .... One tablet by mouth once daily 7)  Repliva 21/7  .... One tablet by mouth once daily after a meal. 8)  Levothroid 137 Mcg Tabs (Levothyroxine sodium) .Marland Kitchen.. 1 by mouth once daily  Patient Instructions: 1)  Schedule followup TSH in 3 months 244.9 2)  Please schedule a follow-up appointment in 3 months .

## 2010-02-15 NOTE — Assessment & Plan Note (Signed)
Summary: 3 month fup/ccm   Vital Signs:  Patient profile:   77 year old male Weight:      195 pounds Temp:     98.6 degrees F oral Pulse rate:   88 / minute Pulse rhythm:   regular Resp:     12 per minute BP sitting:   120 / 72  (left arm) Cuff size:   regular  Vitals Entered By: Sid Falcon LPN (December 22, 2009 8:22 AM)  History of Present Illness: Patient here for medical followup.  History of colon cancer. Recent repeat colonoscopy. Overall feels very well. Playing golf 2-3 times per week.  Good stamina and appetite.  Hyperlipidemia treated with simvastatin 20 mg daily. No side effects from treatment. Due for repeat lipids soon.  History of GERD controlled with omeprazole.  Hypothyroidism with recent elevated TSH and adjustment in dosage. No fatigue issues. Will need repeat levels in 3 months.  Patient cannot receive flu vaccine secondary to history of Guillain-Barr syndrome.  Has had prior Pneumovax.  Allergies: 1)  ! Ferrous Sulfate (Ferrous Sulfate) 2)  Lead Oxide (Lead Oxide)  Past History:  Past Medical History: Last updated: 09/22/2009 Hyperlipidemia Hypertension Hypothyroidism Colon polyps Anemia  2009 Diverticulitis Allergy/Hay Fever GERD Colon Cancer T4 N0 05/2008  HX Guillain Barre' Syndrome  Past Surgical History: Last updated: 09/22/2009 Inguinal herniorrhaphy X 5 Appendectomy 1948 Cervical laminectomy  1968 Lumbar laminectomy   1970 Crushed left heel 1985 Hemorrhoid surgery Transverse Colectomy, Omentectomy 07/2008  Family History: Last updated: 05/25/2008 Stroke, father Lung cancer, older brother Heart disease, 2 brothers Family History of Prostate Cancer:2 brothers No FH of Colon Cancer:  Social History: Last updated: 05/25/2008 Retired Married Alcohol use-yes Smoked 45 years ago Illicit Drug Use - no PMH-FH-SH reviewed for relevance  Review of Systems  The patient denies anorexia, fever, weight loss, chest pain,  syncope, dyspnea on exertion, peripheral edema, prolonged cough, headaches, hemoptysis, abdominal pain, melena, hematochezia, and severe indigestion/heartburn.    Physical Exam  General:  Well-developed,well-nourished,in no acute distress; alert,appropriate and cooperative throughout examination Mouth:  Oral mucosa and oropharynx without lesions or exudates.  Teeth in good repair. Neck:  No deformities, masses, or tenderness noted. Lungs:  Normal respiratory effort, chest expands symmetrically. Lungs are clear to auscultation, no crackles or wheezes. Heart:  Normal rate and regular rhythm. S1 and S2 normal without gallop, murmur, click, rub or other extra sounds. Abdomen:  soft and non-tender.   Extremities:  no edema. Neurologic:  alert & oriented X3 and cranial nerves II-XII intact.   Psych:  normally interactive, good eye contact, not anxious appearing, and not depressed appearing.     Impression & Recommendations:  Problem # 1:  HYPOTHYROIDISM (ICD-244.9) repeat in 3 months. His updated medication list for this problem includes:    Levothroid 150 Mcg Tabs (Levothyroxine sodium) ..... Once daily  Problem # 2:  HYPERLIPIDEMIA (ICD-272.4) labs at f/u His updated medication list for this problem includes:    Simvastatin 40 Mg Tabs (Simvastatin) ..... One half by mouth once daily  Problem # 3:  HYPERTENSION (ICD-401.9)  His updated medication list for this problem includes:    Lisinopril 40 Mg Tabs (Lisinopril) ..... One half tablet daily  Problem # 4:  GERD (ICD-530.81)  His updated medication list for this problem includes:    Omeprazole 20 Mg Cpdr (Omeprazole) ..... 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 One half tablet daily 2)  Simvastatin 40 Mg Tabs (Simvastatin) .... One half by mouth once daily 3)  Cvs Loratadine 10 Mg Tabs  (Loratadine) .... Once daily 4)  Centrum Ultra Mens Tabs (Multiple vitamins-minerals) .... Once daily 5)  Omeprazole 20 Mg Cpdr (Omeprazole) .... One tablet by mouth once daily 6)  Levothroid 150 Mcg Tabs (Levothyroxine sodium) .... Once daily  Patient Instructions: 1)  schedule the following labs in 3 months:  TSH 244.9 2)  lipid and hepatic 272.4,  CEA  V10.05 Prescriptions: OMEPRAZOLE 20 MG CPDR (OMEPRAZOLE) one tablet by mouth once daily  #60 x 11   Entered and Authorized by:   Evelena Peat MD   Signed by:   Evelena Peat MD on 12/22/2009   Method used:   Electronically to        [facility]* (retail)       [address]       [place], [place]  [id number]       Ph: [phone]       Fax: [phone]   RxID:   [id number] SIMVASTATIN 40 MG TABS (SIMVASTATIN) one half by mouth once daily  #30 x 11   Entered and Authorized by:   Evelena Peat MD   Signed by:   Evelena Peat MD on 12/22/2009   Method used:   Electronically to        [facility]* (retail)       [address]       [place], [place]  [id number]       Ph: [phone]       Fax: [phone]   RxID:   [id number]    Orders Added: 1)  Est. Patient Level IV [517[address]]

## 2010-02-15 NOTE — Letter (Signed)
Summary: [facility]   Imported By: Sherian Rein 02/24/2009 07:41:19  _____________________________________________________________________  External Attachment:    Type:   Image     Comment:   External Document

## 2010-02-15 NOTE — Procedures (Signed)
Summary: Colon Prep  Colon Prep   Imported By: Lester Floyd Hill 09/28/2009 10:15:21  _____________________________________________________________________  External Attachment:    Type:   Image     Comment:   External Document

## 2010-02-15 NOTE — Assessment & Plan Note (Signed)
Summary: prefers to discuss colon--ch.   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 He elected not to undergo adjuvant chemotherapy. He has no ongoing gastrointestinal complaints, except for a bulge adjacent to his abdominal incision.   GI Review of Systems      Denies abdominal pain, acid reflux, belching, bloating, chest pain, dysphagia with liquids, dysphagia with solids, heartburn, loss of appetite, nausea, vomiting, vomiting blood, weight loss, and  weight gain.        Denies anal fissure, black tarry stools, change in bowel habit, constipation, diarrhea, diverticulosis, fecal incontinence, heme positive stool, hemorrhoids, irritable bowel syndrome, jaundice, light color stool, liver problems, rectal bleeding, and  rectal pain.   Current Medications (verified): 1)  Lisinopril 40 Mg Tabs (Lisinopril) .... One Half Tablet Daily 2)  Simvastatin 40 Mg Tabs (Simvastatin) .... One Half By Mouth Once Daily 3)  Cvs Loratadine 10 Mg Tabs (Loratadine) .... Once Daily 4)  Centrum Ultra Mens  Tabs (Multiple Vitamins-Minerals) .... Once Daily 5)  Omeprazole 20 Mg Cpdr (Omeprazole) .... One Tablet By Mouth Once Daily 6)  Levothroid 137 Mcg Tabs (Levothyroxine Sodium) .Marland Kitchen.. 1 By Mouth Once Daily  Allergies (verified): 1)  ! Ferrous Sulfate (Ferrous Sulfate) 2)  Lead Oxide (Lead Oxide)  Past History:  Past Medical History: Hyperlipidemia Hypertension Hypothyroidism Colon polyps Anemia  2009 Diverticulitis Allergy/Hay Fever GERD Colon Cancer T4 N0 05/2008  HX Guillain Barre' Syndrome  Past Surgical History: Inguinal herniorrhaphy X 5 Appendectomy 1948 Cervical laminectomy   1968 Lumbar laminectomy   1970 Crushed left heel 1985 Hemorrhoid surgery Transverse Colectomy, Omentectomy 07/2008  Family History: Reviewed history from 05/25/2008 and no changes required. Stroke, father Lung cancer, older brother Heart disease, 2 brothers Family History of Prostate Cancer:2 brothers No FH of Colon Cancer:  Social History: Reviewed history from 05/25/2008 and no changes required. Retired Married Alcohol use-yes Smoked 45 years ago Illicit Drug Use - no  Review of Systems       The patient complains of allergy/sinus, muscle pains/cramps, and urination - excessive.         The pertinent positives and negatives are noted as above and in the HPI. All other ROS were reviewed and were negative.   Vital Signs:  Patient profile:   77 year old male Height:      69 inches Weight:      196.25 pounds BMI:     29.09 Pulse rate:   60 / minute Pulse rhythm:   regular BP sitting:   110 / 70  (left arm) Cuff size:   regular  Vitals Entered By: June McMurray CMA Duncan Dull) (September 22, 2009 9:26 AM)  Physical Exam  General:  Well developed, well nourished, no acute distress. Head:  Normocephalic and atraumatic. Eyes:  PERRLA, no icterus. Mouth:  No deformity or lesions, dentition normal. Lungs:  Clear throughout to auscultation. Heart:  Regular rate and rhythm; no murmurs, rubs,  or bruits. Abdomen:  Soft, nontender and nondistended. No masses, hepatosplenomegaly noted. Normal bowel sounds. he has a well healed midline incision with hernia palpated just to the right in the middle of the incision Rectal:  deferred until time of colonoscopy.   Extremities:  No clubbing, cyanosis, edema or deformities noted. Neurologic:  Alert and  oriented x4;  grossly normal neurologically. Psych:  Alert and cooperative. Normal mood and affect.  Impression & Recommendations:  Problem # 1:  PERSONAL HISTORY MALIG NEOPLASM LARGE INTESTINE (ICD-V10.05) History of T4, N0 colonic  adenocarcinoma, status post transverse colectomy May 2010. He is due for surveillance colonoscopy. The risks, benefits and alternatives to colonoscopy with possible biopsy and possible polypectomy were discussed with the patient and they consent to proceed. The procedure will be scheduled electively. Orders: Colonoscopy (Colon)  Problem # 2:  INCISIONAL HERNIA, ABDOMINAL (ICD-553.21) Small incisional hernia, without significant symptoms. Surgical referral for evaluation following colonoscopy.  Patient Instructions: 1)  Colonoscopy brochure given.  2)  Pick up your prep from you pharmacy.  3)  Copy sent to : Evelena Peat, MD 4)                        Avel Peace, MD 5)                        Eli Hose, MD 6)  The medication list was reviewed and reconciled.  All changed / newly prescribed medications were explained.  A complete medication list was provided to the patient / caregiver.  Prescriptions: MOVIPREP 100 GM  SOLR (PEG-KCL-NACL-NASULF-NA ASC-C) As per prep instructions.  #1 x 0   Entered by:   Christie Nottingham CMA (AAMA)   Authorized by:   Meryl Dare MD [facility]   Signed by:   Christie Nottingham CMA Duncan Dull) on 09/22/2009   Method used:   Electronically to        [facility]* (retail)       [address]       [place], [place]  [id number]       Ph: [phone]       Fax: [phone]   RxID:   [id number] MOVIPREP 100 GM  SOLR (PEG-KCL-NACL-NASULF-NA ASC-C) As per prep instructions.  #1 x 0   Entered by:   Christie Nottingham CMA (AAMA)   Authorized by:   Meryl Dare MD [facility]   Signed by:   Christie Nottingham CMA (AAMA) on 09/22/2009   Method used:   Handwritten   RxID:   [id number]

## 2010-02-15 NOTE — Progress Notes (Signed)
Summary: COL concern  Phone Note Call from Patient Call back at Home Phone [phone]   Caller: Patient Call For: Dr. Russella Dar Reason for Call: Talk to Nurse Summary of Call: upcoming COL and concerned about his BP Initial call taken by: Vallarie Mare,  October 22, 2009 4:15 PM  Follow-up for Phone Call        Patient  has questions about his BP meds and if he needs due cancel his appointment.  I advised no problem with current BP readings.  he is advised he needs to check with his primary care for further questions about BP meds. Follow-up by: Darcey Nora RN, CGRN,  October 22, 2009 4:58 PM

## 2010-02-15 NOTE — Letter (Signed)
Summary: Patient Notice- Polyp Results  [facility]  [address] [place], [place] [address]   Phone: [phone]  Fax: [phone]        October 31, 2009 MRN: [id number]    Ronald Gutierrez [address] [place], [place]  [address]    Dear [name],  I am pleased to inform you that the colon polyp(s) removed during your recent colonoscopy was (were) found to be benign (no cancer detected) upon pathologic examination.  I recommend you have a repeat colonoscopy examination in 1 year to look for recurrent polyps, as having colon polyps increases your risk for having recurrent polyps or even colon cancer in the future.  Should you develop new or worsening symptoms of abdominal pain, bowel habit changes or bleeding from the rectum or bowels, please schedule an evaluation with either your primary care physician or with me.  Continue treatment plan as outlined the day of your exam.  Please call us if you are having persistent problems or have questions about your condition that have not been fully answered at this time.  Sincerely,  Meryl Dare MD [facility]  This letter has been electronically signed by your physician.  Appended Document: Patient Notice- Polyp Results letter mailed

## 2010-02-15 NOTE — Progress Notes (Signed)
Summary: FYI  Phone Note Call from Patient   Caller: Patient Call For: Evelena Peat MD Summary of Call: Pt is scheduled for colonoscopy next week and BP has been running up to 164/85 lately since Dr. Caryl Never cut his meds in one half.  Will see the Saturday clinic tomorrow to see if he needs to increase his BP meds before the colonoscopy. Initial call taken by: Lynann Beaver CMA,  October 22, 2009 4:09 PM

## 2010-02-15 NOTE — Assessment & Plan Note (Signed)
Summary: pt has a growth next to stomach incision/njr   Vital Signs:  Patient profile:   77 year old male Weight:      195 pounds Temp:     97.8 degrees F oral BP sitting:   110 / 70  (left arm) BP standing:   98 / 60 Cuff size:   regular  Vitals Entered By: Sid Falcon LPN (August 04, 2009 9:28 AM)  Serial Vital Signs/Assessments:  Time      Position  BP       Pulse  Resp  Temp     By           Sitting   112/70                         Evelena Peat MD           Standing  98/60                          Evelena Peat MD  CC: growth on stomach   History of Present Illness: Patient seen with 2 month history of some swelling and tenderness incisional site from colon cancer surgery last year. He initially had concerns that this may be some type of regrowth of colon cancer.  Has not any appetite or weight changes and feels excellent overall.   Golfing several times per week.  He has noted occasionally some dark colored stools, non-melanotic over the past few weeks. No midepigastric pain, nausea, or vomiting. He has followup colonoscopy scheduled next month  Has recently had some dizziness and lightheadedness especially on the golf course in the heat. Takes lisinopril 40 mg daily for hypertension. Generally hydrates well. No syncope. No palpitations or chest pain.    Allergies: 1)  ! Ferrous Sulfate (Ferrous Sulfate) 2)  Lead Oxide (Lead Oxide)  Past History:  Past Medical History: Last updated: 10/21/2008 Hyperlipidemia Hypertension Hypothyroidism Colon polyps Anemia  2009 Diverticulitis Allergy/Hay Fever GERD Colon Cancer 5/10 T4N0 HX Guillain Barre' Syndrome  Past Surgical History: Last updated: 05/13/2009 Inguinal herniorrhaphy X 5 Appendectomy 1948 Cervical laminectomy  1968 Lumbar laminectomy   1970 Crushed left heel 1985 Hemorrhoid surgery Partial Colectomy 7/10 Transverse Colon PMH reviewed for relevance, PSH reviewed for relevance  Review of  Systems  The patient denies anorexia, fever, weight loss, chest pain, syncope, peripheral edema, abdominal pain, melena, hematochezia, and severe indigestion/heartburn.    Physical Exam  General:  Well-developed,well-nourished,in no acute distress; alert,appropriate and cooperative throughout examination Mouth:  Oral mucosa and oropharynx without lesions or exudates.  Teeth in good repair. Neck:  No deformities, masses, or tenderness noted. Lungs:  Normal respiratory effort, chest expands symmetrically. Lungs are clear to auscultation, no crackles or wheezes. Heart:  normal rate and regular rhythm.   Abdomen:  soft, non-tender, and normal bowel sounds.  patient has an incisional hernia the ventral abdomen which is soft and nontender. No hepatomegaly or splenomegaly. No other masses palpated. Extremities:  No clubbing, cyanosis, edema, or deformity noted with normal full range of motion of all joints.   Neurologic:  alert & oriented X3, cranial nerves II-XII intact, and gait normal.     Impression & Recommendations:  Problem # 1:  INCISIONAL HERNIA (ICD-553.21) Assessment New for the most part nonpainful. Observation.  Can refer back to general surgeon but at this time he prefers to wait.  Problem # 2:  HYPOTENSION, ORTHOSTATIC (ICD-458.0) Assessment: New  Reduce lisinopril to 20 mg daily.  Hydrate and monitor.  Complete Medication List: 1)  Lisinopril 40 Mg Tabs (Lisinopril) .... One half tablet daily 2)  Simvastatin 40 Mg Tabs (Simvastatin) .... One half by mouth once daily 3)  Cvs Loratadine 10 Mg Tabs (Loratadine) .... Once daily 4)  Adult Aspirin Low Strength 81 Mg Tbdp (Aspirin) .... Once daily 5)  Centrum Ultra Mens Tabs (Multiple vitamins-minerals) .... Once daily 6)  Omeprazole 20 Mg Cpdr (Omeprazole) .... One tablet by mouth once daily 7)  Repliva 21/7  .... One tablet by mouth once daily after a meal. 8)  Levothroid 125 Mcg Tabs (Levothyroxine sodium) .... Once  daily  Patient Instructions: 1)  reduce lisinopril 40 mg to one half tablet daily 2)  Drink plenty of fluids 3)  Check your  Blood Pressure regularly . If it is above:140/90  you should make an appointment.

## 2010-02-15 NOTE — Letter (Signed)
Summary: Colonoscopy-Changed to Office Visit Letter  [facility]  [address] [place], [place] [address]   Phone: [phone]  Fax: [phone]      Jun 08, 2009 MRN: [id number]   Ronald Gutierrez [address] [place], [place]  [address]   Dear [name],   According to our records, it is time for you to schedule a Colonoscopy. However, after reviewing your medical record, I feel that an office visit would be most appropriate to more completely evaluate you and determine your need for a repeat procedure.  Please call [phone] (option #2) at your convenience to schedule an office visit. If you have any questions, concerns, or feel that this letter is in error, we would appreciate your call.   Sincerely,  Judie Petit T. Russella Dar, M.D.  [facility] [phone]

## 2010-02-15 NOTE — Letter (Signed)
[facility]: [facility]   Imported By: Sherian Rein 04/07/2009 14:44:41  _____________________________________________________________________  External Attachment:    Type:   Image     Comment:   External Document

## 2010-02-15 NOTE — Assessment & Plan Note (Signed)
Summary: consult re: getting a referral and fatigue/cjr   Vital Signs:  Patient profile:   77 year old male Weight:      194 pounds Temp:     97.9 degrees F oral BP sitting:   138 / 70  (left arm) Cuff size:   regular  Vitals Entered By: Sid Falcon LPN (May 13, 2009 4:28 PM)  History of Present Illness: Chief complaint of progressive fatigue over recent weeks.  Hx Colon Ca T4N0 with laparoscopic transverse colectomy last summer.  Some fatigue issues since then.  Good appetite and weight stable.  Previous CEA per oncology normal.  Recent CT chest, abdomen, and pelvis unremarkable.  Mild anemia after surgery.  Not tolerant of iron.  Close follow up by oncology and GI.  Denies depressive symptoms.  Staying active with golf.  Denies headache, fever, abd pain, dysuria, stool changes.  Sleeping well.  hypothyroid with TSH elev at 33 last fall and dose increase made and needs f/u levels.  Allergies: 1)  ! Ferrous Sulfate (Ferrous Sulfate) 2)  Lead Oxide (Lead Oxide)  Past History:  Past Medical History: Last updated: 10/21/2008 Hyperlipidemia Hypertension Hypothyroidism Colon polyps Anemia  2009 Diverticulitis Allergy/Hay Fever GERD Colon Cancer 5/10 T4N0 HX Guillain Barre' Syndrome  Family History: Last updated: 05/25/2008 Stroke, father Lung cancer, older brother Heart disease, 2 brothers Family History of Prostate Cancer:2 brothers No FH of Colon Cancer:  Social History: Last updated: 05/25/2008 Retired Married Alcohol use-yes Smoked 45 years ago Illicit Drug Use - no  Past Surgical History: Inguinal herniorrhaphy X 5 Appendectomy 1948 Cervical laminectomy  1968 Lumbar laminectomy   1970 Crushed left heel 1985 Hemorrhoid surgery Partial Colectomy 7/10 Transverse Colon PMH-FH-SH reviewed for relevance  Review of Systems  The patient denies anorexia, fever, weight loss, chest pain, syncope, dyspnea on exertion, peripheral edema, prolonged cough,  headaches, hemoptysis, abdominal pain, melena, hematochezia, severe indigestion/heartburn, hematuria, and muscle weakness.    Physical Exam  General:  Well-developed,well-nourished,in no acute distress; alert,appropriate and cooperative throughout examination Eyes:  pupils equal, pupils round, and pupils reactive to light.   Mouth:  Oral mucosa and oropharynx without lesions or exudates.  Teeth in good repair. Neck:  No deformities, masses, or tenderness noted. Lungs:  Normal respiratory effort, chest expands symmetrically. Lungs are clear to auscultation, no crackles or wheezes. Heart:  normal rate and regular rhythm.   Abdomen:  soft, non-tender, no masses, no hepatomegaly, and no splenomegaly.   Extremities:  no edema Neurologic:  alert & oriented X3, cranial nerves II-XII intact, and strength normal in all extremities.   Skin:  Intact without suspicious lesions or rashes Cervical Nodes:  No lymphadenopathy noted Psych:  normally interactive, good eye contact, not anxious appearing, and not depressed appearing.     Impression & Recommendations:  Problem # 1:  FATIGUE (ICD-780.79) ?underreplaced thyroid, ?anemia, vs other. Orders: TLB-BMP (Basic Metabolic Panel-BMET) (80048-METABOL) TLB-B12, Serum-Total ONLY (72536-U44) Venipuncture (03474)  Problem # 2:  ANEMIA-IRON DEFICIENCY (ICD-280.9) clinically appears he might be mildly anemic. 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 Orders: TLB-CEA (Carcinoembryonic Antigen) (82378-CEA) Venipuncture (25956)  Problem # 4:  HYPOTHYROIDISM (ICD-244.9)  His updated medication list for this problem includes:    Levothroid 112 Mcg Tabs (Levothyroxine sodium) ..... One by mouth once daily  Orders: TLB-TSH (Thyroid Stimulating Hormone) (84443-TSH) Venipuncture (38756)  Complete Medication List: 1)  Lisinopril 40 Mg Tabs (Lisinopril) .... Once  daily  2)  Levothroid 112 Mcg Tabs (Levothyroxine sodium) .... One by mouth once daily 3)  Simvastatin 40 Mg Tabs (Simvastatin) .... One half by mouth once daily 4)  Cvs Loratadine 10 Mg Tabs (Loratadine) .... Once daily 5)  Adult Aspirin Low Strength 81 Mg Tbdp (Aspirin) .... Once daily 6)  Centrum Ultra Mens Tabs (Multiple vitamins-minerals) .... Once daily 7)  Omeprazole 20 Mg Cpdr (Omeprazole) .... One tablet by mouth once daily 8)  Repliva 21/7  .... One tablet by mouth once daily after a meal.  Patient Instructions: 1)  Please schedule a follow-up appointment in 2 weeks.

## 2010-03-09 ENCOUNTER — Telehealth: Payer: Self-pay | Admitting: Family Medicine

## 2010-03-09 ENCOUNTER — Encounter: Payer: Self-pay | Admitting: Family Medicine

## 2010-03-09 ENCOUNTER — Ambulatory Visit (INDEPENDENT_AMBULATORY_CARE_PROVIDER_SITE_OTHER): Payer: PRIVATE HEALTH INSURANCE | Admitting: Family Medicine

## 2010-03-09 VITALS — BP 120/80 | Temp 97.9°F | Ht 69.0 in | Wt 200.0 lb

## 2010-03-09 DIAGNOSIS — I10 Essential (primary) hypertension: Secondary | ICD-10-CM

## 2010-03-09 MED ORDER — LISINOPRIL 40 MG PO TABS: 40.0000 mg | ORAL_TABLET | Freq: Every day | ORAL | Status: DC

## 2010-03-09 NOTE — Progress Notes (Signed)
  Subjective:    Patient ID: Ronald Gutierrez, male    DOB: Dec 14, 1926, 77 y.o.   MRN: [id number]  HPI  Patient seen for followup of hypertension. Had generally been well controlled to about 2 weeks ago this started increasing. Had been taking lisinopril 40 mg one half tablet daily. Yesterday, after playing golf, blood pressure 200/90. Patient increased lisinopril to 40 mg yesterday. Blood pressure improved today. Denies any headaches, dizziness, shortness of breath, or chest pain. No peripheral edema issues. Drinks about one alcoholic beverage per day. No recent nonsteroidal use.   Review of Systems     Objective:   Physical Exam  patient is alert and in no distress Pupils equal round reactive to light  neck no mass Chest clear to auscultation Heart regular rhythm and rate Extremities no edema       Assessment & Plan:   Hypertension improved today on increased dose lisinopril. Continue lisinopril 40 mg daily. Alcohol in moderation. Continue to monitor closely at home

## 2010-03-09 NOTE — Telephone Encounter (Signed)
Pt seeing dr. Caryl Never this afternoon

## 2010-03-09 NOTE — Telephone Encounter (Signed)
Pt needs office follow up.  Make sure he gets appt within next couple of days.

## 2010-03-09 NOTE — Telephone Encounter (Signed)
Pt would like to speak with Dr Caryl Never personally, regarding his bp.He refused 3 times to make an appointment for this morning.

## 2010-03-09 NOTE — Telephone Encounter (Signed)
[facility] Triage Call Report Triage Record Num: [id number] Operator: Elita Boone Patient Name: Ronald Gutierrez Call Date & Time: 03/08/2010 5:22:51PM Patient Phone: [phone] PCP: Evelena Peat Patient Gender: Male PCP Fax : [phone] Patient DOB: 05-08-1926 [facility] Name: [facility] Reason for Call: Pt calling to report BP reading of 175/102, 200/103. Onset of elevated BP about 8 days. Pt reports that he has been at [employer] for 4 hours playing Gin. Pt reporting having some dizzness Pt reports he is drinking a [place] right. pt instucted to drink water and to elevate feet. Follow-up call in 45 mins. Pt's BP was after 45 mins rest was 171/97. Home care advice given for elevated BP and pt instructed to call provider in am. Protocol(s) Used: Hypertension, Diagnosed or Suspected Recommended Outcome per Protocol: See Provider within 72 Hours Reason for Outcome: Multiple elevated blood pressure readings without other symptoms AND no previous work-up OR readings exceed expected range defined by treatment plan Care Advice: Call EMS 911 if new symptoms develop, such as severe shortness of breath, chest pain, change in mental status, acute neurologic deficit, seizure, visual disturbances, pulse rate > 120 / minute, or very irregular pulse. ~ It is important to have blood pressure checked at least annually, or at each provider visit, especially if you have a history of high blood pressure in your family. ~ ~ Call provider if systolic BP is 180 or greater, or if diastolic BP is 120 or greater. ~ HEALTH PROMOTION / MAINTENANCE Avoid the use of stimulants including caffeine (coffee, some soft drinks, some energy drinks, tea and chocolate), cocaine, and amphetamines. Also avoid drinking alcohol. ~ ~ CAUTIONS ~ List, or take, all current prescription(s), nonprescription or alternative medication(s) to provider for evaluation. LIFESTYLE MODIFICATION FOR HYPERTENSION: - Weight  reduction will help lower blood pressure in individuals who are 10% or more above their ideal body weight. - Eat foods low in saturated fat and sugar, and high in complex carbohydrates (vegetables, fruits, whole grains). - Drink alcohol only in moderate amounts (12 oz beer, 5 oz wine, or 1 1/2 oz of distilled alcohol such as vodka, gin, etc.) and not more than 2 drinks/day for men or 1 drink/day for women or lighter-weight persons. - Get at least 30 minutes of moderate aerobic exercise (using as much energy as walking 2 miles in 30 minutes) most days of the week, preferably daily. - Stop smoking to decrease risk for cardiovascular and pulmonary disease, as well as cancer. - Keep regularly scheduled appointments with your provider. ~ Medication Advice: - Discontinue all nonprescription and alternative medications, especially stimulants, until evaluated by provider. - Take prescribed medications as directed, following label instructions for the medication. - Do not change medications or dosing regimen until provider is consulted. - Know possible side effects of medication and what to do if they occur. - Tell provider all prescription, nonprescription or alternative medications that you take ~ 03/08/2010 6:20:10PM Page 1 of 1 [id number]

## 2010-03-09 NOTE — Telephone Encounter (Signed)
Lft vm for pt, stating that he would need to sch an ov within the next few days with Dr Caryl Never, in order to discuss bp issues.

## 2010-04-06 ENCOUNTER — Other Ambulatory Visit (INDEPENDENT_AMBULATORY_CARE_PROVIDER_SITE_OTHER): Payer: PRIVATE HEALTH INSURANCE | Admitting: Family Medicine

## 2010-04-06 DIAGNOSIS — E785 Hyperlipidemia, unspecified: Secondary | ICD-10-CM

## 2010-04-06 DIAGNOSIS — Z85038 Personal history of other malignant neoplasm of large intestine: Secondary | ICD-10-CM

## 2010-04-06 DIAGNOSIS — E039 Hypothyroidism, unspecified: Secondary | ICD-10-CM

## 2010-04-06 LAB — HEPATIC FUNCTION PANEL
AST: 22 U/L (ref 0–37)
Albumin: 4 g/dL (ref 3.5–5.2)

## 2010-04-06 LAB — LIPID PANEL
Cholesterol: 169 mg/dL (ref 0–200)
Total CHOL/HDL Ratio: 3
VLDL: 17.6 mg/dL (ref 0.0–40.0)

## 2010-04-07 NOTE — Progress Notes (Signed)
Quick Note:  Pt informed ______ 

## 2010-04-13 ENCOUNTER — Ambulatory Visit: Payer: Self-pay | Admitting: Family Medicine

## 2010-04-20 ENCOUNTER — Ambulatory Visit (INDEPENDENT_AMBULATORY_CARE_PROVIDER_SITE_OTHER): Payer: PRIVATE HEALTH INSURANCE | Admitting: Family Medicine

## 2010-04-20 ENCOUNTER — Encounter: Payer: Self-pay | Admitting: Family Medicine

## 2010-04-20 DIAGNOSIS — I10 Essential (primary) hypertension: Secondary | ICD-10-CM

## 2010-04-20 DIAGNOSIS — E785 Hyperlipidemia, unspecified: Secondary | ICD-10-CM

## 2010-04-20 DIAGNOSIS — E039 Hypothyroidism, unspecified: Secondary | ICD-10-CM

## 2010-04-20 MED ORDER — LEVOTHYROXINE SODIUM 150 MCG PO TABS: 150.0000 ug | ORAL_TABLET | Freq: Every day | ORAL | Status: DC

## 2010-04-20 NOTE — Progress Notes (Signed)
  Subjective:    Patient ID: Ronald Gutierrez, male    DOB: 1926/08/13, 77 y.o.   MRN: [id number]  HPI Patient here for medical followup. History of colon cancer, hypothyroidism, hyperlipidemia, hypertension, and GERD. Recent abdominal ventral hernia which was evaluated by surgeon. Surgery recommended but patient is having no pain and does not wish to pursue surgery at this time.  Hypothyroidism with recent TSH 6. He feels well with no fatigue. No constipation. Compliant with all medications. Hypertension treated with Zestril with. Good control by home readings. Recent lipids reviewed with patient an excellent HDL and LDL at goal. Takes simvastatin with no myalgias or other side effects.  Denies recent chest pains or dyspnea. Good appetite with no weight change   Review of Systems  Constitutional: Negative for fever, chills, activity change, appetite change and fatigue.  HENT: Negative for trouble swallowing and neck stiffness.   Eyes: Negative for visual disturbance.  Respiratory: Negative for cough and shortness of breath.   Cardiovascular: Negative for chest pain, palpitations and leg swelling.  Gastrointestinal: Negative for nausea, vomiting, abdominal pain, constipation and blood in stool.  Genitourinary: Negative for dysuria.  Skin: Negative for rash.  Hematological: Negative for adenopathy.  Psychiatric/Behavioral: Negative for dysphoric mood.       Objective:   Physical Exam  Constitutional: He is oriented to person, place, and time. He appears well-developed and well-nourished.  Neck: Neck supple.  Cardiovascular: Normal rate, regular rhythm and normal heart sounds.   No murmur heard. Pulmonary/Chest: Effort normal and breath sounds normal. No respiratory distress. He has no wheezes. He has no rales.  Abdominal: Soft. Bowel sounds are normal.       Ventral hernia which is soft and nontender  Musculoskeletal: He exhibits no edema.  Lymphadenopathy:    He has no cervical  adenopathy.  Neurological: He is alert and oriented to person, place, and time.          Assessment & Plan:  #1 hypothyroidism. Discussed titration of thyroid that he is feeling well we decided to hold for now. Recheck in 6 months. Medication refilled #2 hypertension stable #3 hyperlipidemia at goal #4 GERD stable #5 ventral hernia. Patient wishes to observe for now and is using a binder

## 2010-04-24 LAB — CBC
HCT: 27.6 % — ABNORMAL LOW (ref 39.0–52.0)
HCT: 28.3 % — ABNORMAL LOW (ref 39.0–52.0)
Hemoglobin: 9.2 g/dL — ABNORMAL LOW (ref 13.0–17.0)
MCHC: 32.8 g/dL (ref 30.0–36.0)
MCV: 85 fL (ref 78.0–100.0)
MCV: 85.2 fL (ref 78.0–100.0)
MCV: 85.2 fL (ref 78.0–100.0)
Platelets: 256 10*3/uL (ref 150–400)
Platelets: 263 10*3/uL (ref 150–400)
Platelets: 271 10*3/uL (ref 150–400)
RBC: 3.31 MIL/uL — ABNORMAL LOW (ref 4.22–5.81)
RDW: 20.4 % — ABNORMAL HIGH (ref 11.5–15.5)
WBC: 6.9 10*3/uL (ref 4.0–10.5)
WBC: 8.4 10*3/uL (ref 4.0–10.5)
WBC: 8.8 10*3/uL (ref 4.0–10.5)

## 2010-04-24 LAB — BASIC METABOLIC PANEL
BUN: 11 mg/dL (ref 6–23)
BUN: 12 mg/dL (ref 6–23)
BUN: 18 mg/dL (ref 6–23)
CO2: 22 mEq/L (ref 19–32)
Calcium: 8.4 mg/dL (ref 8.4–10.5)
Chloride: 111 mEq/L (ref 96–112)
Chloride: 114 mEq/L — ABNORMAL HIGH (ref 96–112)
Creatinine, Ser: 1.03 mg/dL (ref 0.4–1.5)
Creatinine, Ser: 1.24 mg/dL (ref 0.4–1.5)
GFR calc Af Amer: >60 mL/min (ref >60)
GFR calc non Af Amer: 56 mL/min — ABNORMAL LOW (ref >60)
Glucose, Bld: 127 mg/dL — ABNORMAL HIGH (ref 70–99)
Glucose, Bld: 148 mg/dL — ABNORMAL HIGH (ref 70–99)
Potassium: 3.8 mEq/L (ref 3.5–5.1)
Potassium: 4.5 mEq/L (ref 3.5–5.1)
Sodium: 137 mEq/L (ref 135–145)

## 2010-04-24 LAB — CROSSMATCH: Antibody Screen: NEGATIVE

## 2010-04-24 LAB — HEMOGLOBIN AND HEMATOCRIT, BLOOD: HCT: 27.8 % — ABNORMAL LOW (ref 39.0–52.0)

## 2010-04-25 LAB — CBC
HCT: 33.7 % — ABNORMAL LOW (ref 39.0–52.0)
MCV: 83.8 fL (ref 78.0–100.0)
Platelets: 299 10*3/uL (ref 150–400)
RBC: 4.02 MIL/uL — ABNORMAL LOW (ref 4.22–5.81)
WBC: 9.4 10*3/uL (ref 4.0–10.5)

## 2010-04-25 LAB — DIFFERENTIAL
Basophils Absolute: 0.1 10*3/uL (ref 0.0–0.1)
Basophils Relative: 1 % (ref 0–1)
Eosinophils Absolute: 0.4 10*3/uL (ref 0.0–0.7)
Monocytes Absolute: 1.1 10*3/uL — ABNORMAL HIGH (ref 0.1–1.0)
Monocytes Relative: 12 % (ref 3–12)
Neutro Abs: 6.1 10*3/uL (ref 1.7–7.7)
Neutrophils Relative %: 65 % (ref 43–77)

## 2010-04-25 LAB — COMPREHENSIVE METABOLIC PANEL
ALT: 19 U/L (ref 0–53)
Albumin: 3.5 g/dL (ref 3.5–5.2)
Alkaline Phosphatase: 93 U/L (ref 39–117)
BUN: 18 mg/dL (ref 6–23)
Chloride: 106 mEq/L (ref 96–112)
Glucose, Bld: 99 mg/dL (ref 70–99)
Potassium: 4.2 mEq/L (ref 3.5–5.1)
Sodium: 136 mEq/L (ref 135–145)
Total Bilirubin: 0.5 mg/dL (ref 0.3–1.2)
Total Protein: 7.1 g/dL (ref 6.0–8.3)

## 2010-04-25 LAB — CROSSMATCH

## 2010-05-31 NOTE — Op Note (Signed)
Ronald Gutierrez, Ronald Gutierrez NO.:  [id number]   MEDICAL RECORD NO.:  [id number]          PATIENT TYPE:  INP   LOCATION:  0001                         FACILITY:  [facility]   PHYSICIAN:  Adolph Pollack, M.D.DATE OF BIRTH:  1927-01-08   DATE OF PROCEDURE:  07/21/2008  DATE OF DISCHARGE:                               OPERATIVE REPORT   PREOPERATIVE DIAGNOSIS:  Transverse colon cancer.   POSTOPERATIVE DIAGNOSIS:  Transverse colon cancer.   PROCEDURES:  1. Laparoscopic lysis of adhesions for 30 minutes.  2. Open transverse colectomy and omentectomy.  3. Mobilization of the splenic flexure.   ASSISTANT:  Currie Paris, M.D.   ANESTHESIA:  General.   INDICATION:  Mr. Talcott is a 77 year old male who some extreme fatigue  and was found to be anemic.  He underwent upper endoscopy and  colonoscopy.  In the distal transverse colon, a large lesion was found  and was biopsied and positive for adenocarcinoma.  The CT scan did not  show any evidence of metastatic disease but did demonstrate the lesion.  His preoperative CEA level was elevated at 34.9.  He now presents for  laparoscopy and possible open transverse colectomy.   TECHNIQUE:  He was brought to the operating room, placed supine on the  operating table, and a general anesthetic was administered.  He was  placed in lithotomy position.  The abdominal wall was sterilely prepped  and draped after insertion of a Foley.  A small supraumbilical incision  was made through skin, subcutaneous tissue, fascia, and peritoneum under  direct vision.  A pursestring suture of 0-Vicryl was placed around the  fascial edges.  A Hassan trocar was introduced into the peritoneal  cavity and pneumoperitoneum created by insufflation of CO2 gas.   Next the laparoscope was introduced.  Two 5-mm trocars were then placed,  one in the left lower quadrant and one in the right lower quadrant.   I attempted to visualize the distal transverse  colon but was unable to  do so because of heavy omental draping.  The omentum was fairly adherent  to the groin areas bilaterally as well as the cecum.  Using sharp and  blunt dissection, I performed lysis of adhesions, freeing up the omentum  from the abdominal wall, partially mobilizing it.  In this way, I was  able to mobilize the omentum and retract it anteriorly, exposing the  area of the transverse colon where the lesion was.  I then noted a firm  adherence between the omentum and transverse colon consistent with local  involvement of the omentum to the lesion.  I did not think I could do an  adequate resection based on both this local involvement.  Subsequently  then I removed the trocars and made an upper midline incision dividing  the skin, subcutaneous tissue, and fascia.  Then I fully mobilized the  colon.  I freed up adhesions to the sigmoid mesocolon.  I identified a  tattoo mark and also a tumor which was involving part of the omentum.   Using a LigaSure device, I began mobilizing  omentum free from the  transverse colon and then took a wedge of omentum that was adherent to  the distal transverse colon lesion.  I then used electrocautery to  mobilize the left colon and sigmoid colon as well as the splenic  flexure.  I continued to mobilize omentum free from the transverse  colon, and I mobilized the hepatic flexure in the right colon.  I then  excised the entire omentum and left a small amount where it was adherent  to the tumor and sent the omentum to pathology as well.   Using the GIA stapler, I divided the colon at the mid-descending portion  and at the hepatic flexure.  I the mesentary and did a wedge-shaped  resection of it.  I marked the distal end of the colon with a suture and  sent this to pathology.  I did not feel any liver lesions and did not  see any small bowel lesions or pelvic metastasis.   Following this, I was able to place the distal descending colon in  a  side-to-side fashion with the right colon near.  A stapled anastomosis  was performed using the GIA stapler.  The common defect was closed with  a linear non-cutting stapler.  The distal portion of the staple line was  reinforced with a 3-0 silk suture.  The anastomosis was patent, viable,  and under no tension.   The abdominal cavity was copiously irrigated and gloves were changed.  No bleeding was noted, and all four quadrants were inspected.  The  irrigation fluid was evacuated as much as possible.   Following this, the fascia was then closed with a running #1 looped PDS  suture.  Needle sponge, and instrument counts were reported to be  correct.  The subcutaneous tissue was then irrigated.  The skin of the  upper midline incision and trocar sites was then closed with staples and  sterile dressings were applied.   He tolerated the procedure without any apparent complications and was  taken to the recovery room in satisfactory addition.      Adolph Pollack, M.D.  Electronically Signed     TJR/MEDQ  D:  07/21/2008  T:  07/21/2008  Job:  [id number]   cc:   Venita Lick. Russella Dar, MD, FACG  [address]  [place]  [place] [address]   Evelena Peat, M.D.

## 2010-05-31 NOTE — Discharge Summary (Signed)
[name], [name]:  [id number]   MEDICAL RECORD NO.:  [id number]          PATIENT TYPE:  INP   LOCATION:  1540                         FACILITY:  [facility]   PHYSICIAN:  Adolph Pollack, M.D.DATE OF BIRTH:  [birth year]   DATE OF ADMISSION:  07/21/2008  DATE OF DISCHARGE:  07/29/2008                               DISCHARGE SUMMARY   DISCHARGE DIAGNOSIS:  T4 N0 transverse colon cancer.   SECONDARY DIAGNOSIS:  1. Postoperative ileus.  2. Hypertension.  3. Hypothyroidism.  4. Hiatal hernia.  5. Gastroesophageal reflux disease.  6. Anemia.  7. Hypercholesterolemia.   PROCEDURE:  Laparoscopic lysis of adhesions and open transverse  colectomy and omentectomy.   REASON FOR ADMISSION:  This 77 year old male had been having some  fatigue and was found to be anemic.  A colonoscopy was performed and a  distal transverse colon lesion was noted and biopsied and positive for  adenocarcinoma.  He had been given a transfusion at the end of May.  A  CT scan did not demonstrate any obvious metastatic disease.  He was  admitted for the above procedure.   HOSPITAL COURSE:  He underwent the above procedure and tolerated it  well.  He was somewhat hypovolemic and had some acute blood loss anemia  and this was corrected with hydration.  He was started on a clear liquid  diet his first postoperative day but had some nausea and vomiting and  ended up having to have an NG tube placement and a KUB demonstrating  findings consistent with ileus.  After that, he had some slow resolution  of his ileus.  He would tolerate breakfast and lunch but then threw up  after dinner.  However, by his 7th and 8th postoperative day this was  much improved.  His ileus had resolved.  Incision was clean, dry, and  intact and he was able to be discharged.   DISPOSITION:  Discharged to home July 29, 2008.  Staples were removed in  the hospital.  He was given discharge instructions and pain  medication  and will resume all of his home medicines.  He will follow up in the  office in a few weeks for his postop check.      Adolph Pollack, M.D.  Electronically Signed     TJR/MEDQ  D:  08/11/2008  T:  08/11/2008  Job:  [id number]   cc:   Venita Lick. Russella Dar, MD, FACG  [address]  [place]  [place] [address]   Evelena Peat, M.D.

## 2010-06-03 NOTE — Op Note (Signed)
Ronald Gutierrez, Ronald Gutierrez                   ACCOUNT NO.:  [id number]   MEDICAL RECORD NO.:  [id number]          PATIENT TYPE:  AMB   LOCATION:  DAY                          FACILITY:  [facility]   PHYSICIAN:  Adolph Pollack, M.D.DATE OF BIRTH:  [birth year]   DATE OF PROCEDURE:  09/26/2004  DATE OF DISCHARGE:                                 OPERATIVE REPORT   PREOPERATIVE DIAGNOSIS:  Recurrent left inguinal hernia.   POSTOPERATIVE DIAGNOSIS:  Recurrent left inguinal hernia.   PROCEDURE:  Laparoscopic repair of recurrent left inguinal hernia with mesh.   SURGEON:  Adolph Pollack, M.D.   ANESTHESIA:  General.   INDICATIONS:  Mr. Pellecchia is a 77 year old male very active whose had  bilateral hernia repairs before with a recurrence on the right side and a  recurrence on the left side. His left sided recurrence is beginning to  bother him and it is obvious on physical exam and now he presents for  laparoscopic repair. We have discussed the procedure and the risks as well  as the aftercare preoperatively.   TECHNIQUE:  He was seen in the holding area and left groin marked with my  initials. He was then brought to the operating room, placed supine on the  operating table and a general anesthetic was administered. He did void  before coming back to the operating room. The lower abdominal wall and lower  left groin area was clipped. The area was sterilely prepped and draped.  Dilute Marcaine solution was infiltrated in the subumbilical region and a  small subumbilical incision was made through the skin and subcutaneous  tissue. There was blunt dissection and I identified the left anterior rectus  sheath. A small incision was made in the left anterior rectus sheath. The  underlying left rectus muscle was swept laterally exposing the posterior  rectus sheath. A balloon dissection device was then placed into the  extraperitoneal space and under laparoscopic vision, balloon dissection was  performed  in the lower midline in the left extraperitoneal space.   The balloon dissection device was removed and then a trocar was inserted  into the extraperitoneal space and CO2 insufflated creating a working area.  The laparoscope was introduced and under direct vision two 5 mm trocars were  inserted just to the right of the lower midline. Using blunt dissection, I  identified the symphysis pubis and Cooper's ligament on the left side. The  inferior epigastric vessels were identified. I dissected fibrofatty tissue  in the anterior and lateral abdominal wall. A very large hernia sac was  noted going into the indirect space and extending some toward the direct  space. It was very adherent to the cord and other structures and small holes  were placed in the hernia sac creating a pneumoperitoneum.  This was  evacuated by way of using a Veress needle just lateral to the large trocar  and the subumbilical region. I then placed him in Trendelenburg position. I  carefully used blunt dissection to identify the spermatic cord and then  dissected the hernia sac free from the spermatic  cord which took about 20  minutes as it was fairly adherent. I was then able to expose the direct  space and reduce extraperitoneal fat from that part that had also become  involved with the hernia. I stripped the sac and the lipoma of the cord back  to the level of the umbilicus. Hemostasis was adequate.   Following this, a piece of 5 x 6 inch polypropylene mesh with a partial  longitudinal slit cut into it was placed into the extraperitoneal space and  positioned appropriately. The two tails wrapped around the spermatic cord  contents. It was then anchored to Cooper's ligament, the anterior lateral  abdominal walls with spiral tacks. Two tails created a new internal ring and  the tack was used to secure this. This covered the direct, indirect and  femoral spaces well.   Following this, I held down the inferolateral aspect  of the mesh and then  released the CO2 gas and watched the extraperitoneal counts become  approximated to the mesh. I then removed the remaining trocars. The anterior  rectus sheath fascial defect was then closed with interrupted zero Vicryl  sutures. The skin was closed with subcuticular 4-0 Monocryl stitches  followed by Steri-Strips and sterile dressings.   He tolerated the procedure well without any apparent complications and was  taken to the recovery room in satisfactory condition. Plan will be for  outpatient surgery and he will be given Tylox and instructions.      Adolph Pollack, M.D.  Electronically Signed     TJR/MEDQ  D:  09/26/2004  T:  09/26/2004  Job:  [id number]   cc:   Marjory Lies, M.D.  [address]  [place]  [place] [address]  Fax: [phone]

## 2010-06-21 ENCOUNTER — Ambulatory Visit (INDEPENDENT_AMBULATORY_CARE_PROVIDER_SITE_OTHER): Payer: PRIVATE HEALTH INSURANCE | Admitting: Internal Medicine

## 2010-06-21 ENCOUNTER — Encounter: Payer: Self-pay | Admitting: Internal Medicine

## 2010-06-21 DIAGNOSIS — R0602 Shortness of breath: Secondary | ICD-10-CM

## 2010-06-21 DIAGNOSIS — R079 Chest pain, unspecified: Secondary | ICD-10-CM

## 2010-06-21 DIAGNOSIS — I10 Essential (primary) hypertension: Secondary | ICD-10-CM

## 2010-06-21 DIAGNOSIS — E785 Hyperlipidemia, unspecified: Secondary | ICD-10-CM

## 2010-06-21 MED ORDER — METOPROLOL TARTRATE 25 MG PO TABS: 25.0000 mg | ORAL_TABLET | Freq: Two times a day (BID) | ORAL | Status: DC

## 2010-06-21 MED ORDER — TECHNETIUM TC 99M SESTAMIBI - CARDIOLITE: INTRAVENOUS | Status: DC

## 2010-06-21 NOTE — Patient Instructions (Signed)
Take 81 mg of aspirin daily  Metoprolol 25 mg twice daily Avoid strenuous activity until stress test completed  Call if you develop  any further worsening chest pain or report to the emergency department

## 2010-06-21 NOTE — Progress Notes (Signed)
  Subjective:    Patient ID: Ronald Gutierrez, male    DOB: [birth year], 77 y.o.   MRN: [id number]  HPI  77 year old patient who has a prior history of hypertension and dyslipidemia. He has remote history of colon cancer and is status post resection approximately 2 years ago. He presented with anemia. He was stable until earlier today when he first noted the left anterior chest pain. This occurred while he was [place] the [place] and initially lasted approximately 20 seconds. It was described as a significant pressure sensation in the left anterior chest wall region. Pain quickly resolved with rest but recurred several times throughout the morning. He stated that the pain occurred 6 or 7 times lasting 10-20 seconds and occurred without other symptoms. He denies significant change in his shortness of breath. He plays golf 4 days per week and often has occasional shortness of breath and fatigue towards the end of the round. He has had no worsening dyspnea on exertion.  He does have a history of gastroesophageal reflux disease and has been on chronic omeprazole. He denies any similar symptoms in the past.  He states that he experienced a vigorous knee is yesterday he feels may have precipitated the chest pain today     Review of Systems  Constitutional: Negative for fever, chills, appetite change and fatigue.  HENT: Negative for hearing loss, ear pain, congestion, sore throat, trouble swallowing, neck stiffness, dental problem, voice change and tinnitus.   Eyes: Negative for pain, discharge and visual disturbance.  Respiratory: Negative for cough, chest tightness, wheezing and stridor.   Cardiovascular: Positive for chest pain. Negative for palpitations and leg swelling.  Gastrointestinal: Negative for nausea, vomiting, abdominal pain, diarrhea, constipation, blood in stool and abdominal distention.  Genitourinary: Negative for urgency, hematuria, flank pain, discharge, difficulty urinating and genital  sores.  Musculoskeletal: Negative for myalgias, back pain, joint swelling, arthralgias and gait problem.  Skin: Negative for rash.  Neurological: Negative for dizziness, syncope, speech difficulty, weakness, numbness and headaches.  Hematological: Negative for adenopathy. Does not bruise/bleed easily.  Psychiatric/Behavioral: Negative for behavioral problems and dysphoric mood. The patient is not nervous/anxious.        Objective:   Physical Exam  Constitutional: He is oriented to person, place, and time. He appears well-developed and well-nourished.       Blood pressure 120/70. No distress  HENT:  Head: Normocephalic.  Right Ear: External ear normal.  Left Ear: External ear normal.  Eyes: Conjunctivae and EOM are normal.  Neck: Normal range of motion.  Cardiovascular: Normal rate and normal heart sounds.   Pulmonary/Chest: Breath sounds normal.       O2 saturation 97%  Abdominal: Bowel sounds are normal.  Musculoskeletal: Normal range of motion. He exhibits no edema and no tenderness.  Neurological: He is alert and oriented to person, place, and time.  Psychiatric: He has a normal mood and affect. His behavior is normal.          Assessment & Plan:   Chest pain syndrome in a patient with multiple cardiovascular risk factors.  We'll place the patient on daily aspirin limit his activities and placed on metoprolol 25 mg twice a day until a Cardiolite stress test can be completed. He is aware of the possibility of this being an acute coronary syndrome and will call the office or report to the ED if symptoms worsen. Pain is atypical and I favor more a chest wall pain Hypertension stable Dyslipidemia

## 2010-06-23 ENCOUNTER — Telehealth: Payer: Self-pay | Admitting: *Deleted

## 2010-06-23 DIAGNOSIS — I951 Orthostatic hypotension: Secondary | ICD-10-CM

## 2010-06-23 DIAGNOSIS — I10 Essential (primary) hypertension: Secondary | ICD-10-CM

## 2010-06-23 NOTE — Telephone Encounter (Signed)
Pt was calling about the stress test referral. Order is not in Epic.

## 2010-06-23 NOTE — Telephone Encounter (Signed)
Order sent to [facility] 

## 2010-06-23 NOTE — Telephone Encounter (Signed)
Please order a Cardiolite stress test with [facility] cardiology

## 2010-06-28 ENCOUNTER — Ambulatory Visit (HOSPITAL_COMMUNITY): Payer: Medicare Other | Attending: Internal Medicine | Admitting: Radiology

## 2010-06-28 DIAGNOSIS — R0789 Other chest pain: Secondary | ICD-10-CM

## 2010-06-28 DIAGNOSIS — I951 Orthostatic hypotension: Secondary | ICD-10-CM | POA: Insufficient documentation

## 2010-06-28 DIAGNOSIS — R0989 Other specified symptoms and signs involving the circulatory and respiratory systems: Secondary | ICD-10-CM

## 2010-06-28 DIAGNOSIS — R0602 Shortness of breath: Secondary | ICD-10-CM | POA: Insufficient documentation

## 2010-06-28 DIAGNOSIS — R079 Chest pain, unspecified: Secondary | ICD-10-CM | POA: Insufficient documentation

## 2010-06-28 DIAGNOSIS — I10 Essential (primary) hypertension: Secondary | ICD-10-CM | POA: Insufficient documentation

## 2010-06-28 DIAGNOSIS — I4949 Other premature depolarization: Secondary | ICD-10-CM

## 2010-06-28 MED ORDER — TECHNETIUM TC 99M TETROFOSMIN IV KIT
33.0000 | PACK | Freq: Once | INTRAVENOUS | Status: AC | PRN
  Administered 2010-06-28: 33 via INTRAVENOUS

## 2010-06-28 MED ORDER — TECHNETIUM TC 99M TETROFOSMIN IV KIT
11.0000 | PACK | Freq: Once | INTRAVENOUS | Status: AC | PRN
  Administered 2010-06-28: 11 via INTRAVENOUS

## 2010-06-28 NOTE — Progress Notes (Signed)
[facility] SITE 3 NUCLEAR MED [address] [place] [address] [phone]  Cardiology Nuclear Med Study  Ronald Gutierrez is a 77 y.o. male [id number] 10/18/1926   Nuclear Med Background Indication for Stress Test:  Evaluation for Ischemia History:  No previous documented CAD Cardiac Risk Factors: History of Smoking, Hypertension and Lipids  Symptoms:  Chest Pressure/Burning with Exertion (last episode of chest discomfort was about one week ago), Diaphoresis, DOE and Fatigue   Nuclear Pre-Procedure Caffeine/Decaff Intake:  None NPO After: 7:00pm   Lungs:  Clear. IV 0.9% NS with Angio Cath:  18g  IV Site: R Antecubital  IV Started by:  Stanton Kidney, EMT-P  Chest Size (in):  42 Cup Size: n/a  Height: 5' 9.5" (1.765 m)  Weight:  196 lb (88.905 kg)  BMI:  Body mass index is 28.53 kg/(m^2). Tech Comments:  Metoprolol held > 24 hours, per patient.    Nuclear Med Study 1 or 2 day study: 1 day  Stress Test Type:  Stress  Reading MD: Willa Rough, MD  Order Authorizing Provider:  Eleonore Chiquito, MD  Resting Radionuclide: Technetium 59m Tetrofosmin  Resting Radionuclide Dose: 11 mCi   Stress Radionuclide:  Technetium 25m Tetrofosmin  Stress Radionuclide Dose: 33 mCi           Stress Protocol Rest HR: 61 Stress HR: 136  Rest BP: 151/90 Stress BP: 195/97  Exercise Time (min): 6:00 METS: 7.0   Predicted Max HR: 139 bpm % Max HR: 97.84 bpm Rate Pressure Product: 62952   Dose of Adenosine (mg):  n/a Dose of Lexiscan: n/a mg  Dose of Atropine (mg): n/a Dose of Dobutamine: n/a mcg/kg/min (at max HR)  Stress Test Technologist: Smiley Houseman, CMA-N  Nuclear Technologist:  Domenic Polite, CNMT     Rest Procedure:  Myocardial perfusion imaging was performed at rest 45 minutes following the intravenous administration of Technetium 67m Tetrofosmin.  Rest ECG: No acute changes.  Stress Procedure:  The patient exercised for six minutes on the treadmill utilizing  the Bruce protocol.  The patient stopped due to dyspnea and fatigue.  He denied any chest pain.  There were nonspecific ST-T wave changes with occasional PVC's and rare PAC's.  Technetium 21m Tetrofosmin was injected at peak exercise and myocardial perfusion imaging was performed after a brief delay.  Stress ECG: No significant change from baseline ECG  QPS Raw Data Images:  Patient motion noted; appropriate software correction applied. Stress Images:  Apical thinning Rest Images:  Same as stress Subtraction (SDS):  No evidence of ischemia. Transient Ischemic Dilatation (Normal <1.22):  .87 Lung/Heart Ratio (Normal <0.45):  .45  Quantitative Gated Spect Images QGS EDV:  74 ml QGS ESV:  23 ml QGS cine images:  Normal Wall Motion QGS EF: 69%  Impression Exercise Capacity:  Fair exercise capacity. BP Response:  Normal blood pressure response. Clinical Symptoms:  No chest pain. ECG Impression:  No significant ST segment change suggestive of ischemia. Comparison with Prior Nuclear Study: No previous nuclear study performed  Overall Impression:  Normal stress nuclear study.   Willa Rough

## 2010-06-29 NOTE — Progress Notes (Signed)
Copy to Dr. Maggie Schwalbe.Ronald Gutierrez

## 2010-07-05 ENCOUNTER — Telehealth: Payer: Self-pay | Admitting: *Deleted

## 2010-07-05 NOTE — Telephone Encounter (Signed)
Pt request results from stress test from a week ago

## 2010-07-06 ENCOUNTER — Telehealth: Payer: Self-pay | Admitting: *Deleted

## 2010-07-06 NOTE — Telephone Encounter (Signed)
After review by dr. Amador Cunas in dr. Caryl Never absence - stress test normal  Pt called and made aware. Info placed on dr. Lucie Leather desk . KIK

## 2010-07-06 NOTE — Telephone Encounter (Signed)
Needs results of stress test done last week.

## 2010-07-06 NOTE — Telephone Encounter (Signed)
Done by Reece Leader

## 2010-07-06 NOTE — Telephone Encounter (Signed)
i have place copy of test on your desk - please advise

## 2010-09-09 ENCOUNTER — Ambulatory Visit (INDEPENDENT_AMBULATORY_CARE_PROVIDER_SITE_OTHER): Payer: Medicare Other | Admitting: Family Medicine

## 2010-09-09 ENCOUNTER — Encounter: Payer: Self-pay | Admitting: Family Medicine

## 2010-09-09 VITALS — BP 118/70 | Temp 97.5°F | Wt 196.0 lb

## 2010-09-09 DIAGNOSIS — R05 Cough: Secondary | ICD-10-CM

## 2010-09-09 DIAGNOSIS — R059 Cough, unspecified: Secondary | ICD-10-CM

## 2010-09-09 MED ORDER — HYDROCODONE-HOMATROPINE 5-1.5 MG/5ML PO SYRP: 5.0000 mL | ORAL_SOLUTION | Freq: Four times a day (QID) | ORAL | Status: AC | PRN

## 2010-09-09 MED ORDER — CEFUROXIME AXETIL 250 MG PO TABS: 250.0000 mg | ORAL_TABLET | Freq: Two times a day (BID) | ORAL | Status: AC

## 2010-09-09 NOTE — Progress Notes (Signed)
  Subjective:    Patient ID: Ronald Gutierrez, male    DOB: [birth year], 77 y.o.   MRN: [id number]  HPI Patient seen with over 10 day history of cough and nasal congestion symptoms. Initially started with body aches, possible low-grade fever, and sore throat. Main symptom now is persistent nasal congestion and cough productive of yellow sputum. Cough does not seem to be improving. No wheezing. Denies dyspnea. Increased fatigue. No hemoptysis.  Recent presentation for chest discomfort. Nuclear stress test negative. No chest pain since then. Continues to play golf regularly. Ex-smoker   Review of Systems  Constitutional: Positive for fatigue. Negative for fever and chills.  HENT: Positive for congestion. Negative for sore throat.   Respiratory: Positive for cough. Negative for shortness of breath and wheezing.   Cardiovascular: Negative for chest pain.       Objective:   Physical Exam  Constitutional: He appears well-developed and well-nourished.  HENT:  Right Ear: External ear normal.  Left Ear: External ear normal.  Mouth/Throat: Oropharynx is clear and moist. No oropharyngeal exudate.  Neck: Neck supple. No thyromegaly present.  Cardiovascular: Normal rate and regular rhythm.   Pulmonary/Chest: Effort normal and breath sounds normal. No respiratory distress. He has no wheezes. He has no rales.  Musculoskeletal: He exhibits no edema.  Lymphadenopathy:    He has no cervical adenopathy.          Assessment & Plan:  Persistent cough. Suspect this started as a viral syndrome. Given his age and continued productive cough start Ceftin 250 mg twice daily for 7 days. Hycodan cough syrup 1 teaspoon each bedtime at night when necessary

## 2010-09-09 NOTE — Patient Instructions (Signed)
Follow up immediately for any fever or worsening symptoms. 

## 2010-10-19 ENCOUNTER — Encounter: Payer: Self-pay | Admitting: Family Medicine

## 2010-10-19 ENCOUNTER — Ambulatory Visit (INDEPENDENT_AMBULATORY_CARE_PROVIDER_SITE_OTHER): Payer: Medicare Other | Admitting: Family Medicine

## 2010-10-19 DIAGNOSIS — C184 Malignant neoplasm of transverse colon: Secondary | ICD-10-CM

## 2010-10-19 DIAGNOSIS — I10 Essential (primary) hypertension: Secondary | ICD-10-CM

## 2010-10-19 DIAGNOSIS — E039 Hypothyroidism, unspecified: Secondary | ICD-10-CM

## 2010-10-19 NOTE — Progress Notes (Signed)
  Subjective:    Patient ID: Ronald Gutierrez, male    DOB: March 22, 1926, 77 y.o.   MRN: [id number]  HPI Medical followup. Cough from last visit fully resolved. Patient's hypertension treated with ACE inhibitor. No further cough. Blood pressures mostly less than 140/90 by home readings which are reviewed. No orthostasis. Compliant with all medications. No recent dyspnea or chest pain.  Hypothyroidism. Thyroid slightly under replaced we've slowly titrated his thyroid medication. Needs repeat today. No fatigue issues. States quite active with golf.  History of colon cancer transverse colon. No stool changes. Needs repeat colonoscopy at some point this year and he wishes to defer till springtime. Good appetite.  Past Medical History  Diagnosis Date  . ANEMIA 05/20/2008  . FATIGUE 05/13/2009  . GERD 05/25/2008  . HYPERLIPIDEMIA 05/20/2008  . HYPERTENSION 05/20/2008  . KERATOSIS 10/21/2008  . 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 PERSONAL HISTORY MALIG NEOPLASM LARGE INTESTINE 09/22/2009  . PERSONAL HX COLONIC POLYPS 05/25/2008   Past Surgical History  Procedure Date  . Appendectomy   . Cervical laminectomy   . Lumbar laminectomy   . Hemorrhoid surgery   . Colectomy     transverse  . Omentectomy   . Hernia repair     inguinal    reports that he quit smoking about 42 years ago. His smoking use included Cigarettes. He has a 22.5 pack-year smoking history. He does not have any smokeless tobacco history on file. His alcohol and drug histories not on file. family history is not on file. Allergies  Allergen Reactions  . Ferrous Sulfate     REACTION: Stomach upset      Review of Systems  Constitutional: Negative for activity change, appetite change and unexpected weight change.  HENT: Negative for trouble swallowing.   Respiratory: Negative for cough and shortness of breath.   Cardiovascular: Negative for chest pain, palpitations and leg swelling.  Gastrointestinal: Negative for  nausea, vomiting, abdominal pain, diarrhea and blood in stool.  Genitourinary: Negative for dysuria.  Skin: Negative for rash.  Neurological: Negative for dizziness and headaches.  Psychiatric/Behavioral: Negative for dysphoric mood.       Objective:   Physical Exam  Constitutional: He is oriented to person, place, and time. He appears well-developed and well-nourished.  HENT:  Mouth/Throat: Oropharynx is clear and moist.  Neck: Neck supple.  Cardiovascular: Normal rate and regular rhythm.   Pulmonary/Chest: Effort normal and breath sounds normal. No respiratory distress. He has no wheezes. He has no rales.  Musculoskeletal: He exhibits no edema.  Lymphadenopathy:    He has no cervical adenopathy.  Neurological: He is alert and oriented to person, place, and time.          Assessment & Plan:  #1 hypertension. Stable. Continue current medication #2 hypothyroidism. Recheck TSH  #3 history of colon cancer. Patient will defer colonoscopy until springtime. Repeat CEA level. #4 health maintenance. 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

## 2010-10-19 NOTE — Progress Notes (Signed)
Addended by: Bonnye Fava on: 10/19/2010 09:45 AM   Modules accepted: Orders

## 2010-10-20 NOTE — Progress Notes (Signed)
Quick Note:  Pt informed on home VM ______ 

## 2010-11-22 ENCOUNTER — Encounter: Payer: Self-pay | Admitting: Gastroenterology

## 2010-12-29 ENCOUNTER — Other Ambulatory Visit: Payer: Self-pay | Admitting: Family Medicine

## 2011-01-16 ENCOUNTER — Encounter: Payer: Self-pay | Admitting: Family Medicine

## 2011-01-16 ENCOUNTER — Ambulatory Visit (INDEPENDENT_AMBULATORY_CARE_PROVIDER_SITE_OTHER): Payer: Medicare Other | Admitting: Family Medicine

## 2011-01-16 VITALS — BP 142/90 | Temp 98.0°F | Wt 202.0 lb

## 2011-01-16 DIAGNOSIS — R1033 Periumbilical pain: Secondary | ICD-10-CM

## 2011-01-16 DIAGNOSIS — I10 Essential (primary) hypertension: Secondary | ICD-10-CM

## 2011-01-16 MED ORDER — CIPROFLOXACIN HCL 500 MG PO TABS: 500.0000 mg | ORAL_TABLET | Freq: Two times a day (BID) | ORAL | Status: AC

## 2011-01-16 NOTE — Progress Notes (Signed)
  Subjective:    Patient ID: Ronald Gutierrez, male    DOB: 1926/04/08, 77 y.o.   MRN: [id number]  HPI  Patient here for the following:  Acute problem of abdominal pain. Onset yesterday. Symptoms moderate.  Dull pain mid abdomen just below umbilicus. Symptoms somewhat diffuse. Has had some recent constipation. Symptoms have improved following bowel movement this morning-but not fully resolved. He had a hard stool followed by loose bowel movement. No blood. Denies urinary symptoms. No fever or chills. History of colon cancer with resection over one year ago. No history of small bowel obstruction. Appetite generally has been fair. No recent weight loss.  Patient has had previous appendectomy. Reports prior history of diverticulitis several years ago.  Elevated blood pressure. Several readings over 160 systolic and 80s to 90s diastolic. Recently lisinopril 40 mg one half tablet.  No chest pains or dizziness. No headaches.  Past Medical History  Diagnosis Date  . ANEMIA 05/20/2008  . FATIGUE 05/13/2009  . GERD 05/25/2008  . HYPERLIPIDEMIA 05/20/2008  . HYPERTENSION 05/20/2008  . KERATOSIS 10/21/2008  . 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 PERSONAL HISTORY MALIG NEOPLASM LARGE INTESTINE 09/22/2009  . PERSONAL HX COLONIC POLYPS 05/25/2008   Past Surgical History  Procedure Date  . Appendectomy   . Cervical laminectomy   . Lumbar laminectomy   . Hemorrhoid surgery   . Colectomy     transverse  . Omentectomy   . Hernia repair     inguinal    reports that he quit smoking about 43 years ago. His smoking use included Cigarettes. He has a 22.5 pack-year smoking history. He does not have any smokeless tobacco history on file. His alcohol and drug histories not on file. family history is not on file. Allergies  Allergen Reactions  . Ferrous Sulfate     REACTION: Stomach upset      Review of Systems  Constitutional: Negative for fever, chills, fatigue and unexpected weight change.    Respiratory: Negative for cough and shortness of breath.   Cardiovascular: Negative for chest pain.  Gastrointestinal: Positive for abdominal pain and constipation. Negative for nausea, vomiting, blood in stool and anal bleeding.  Genitourinary: Negative for dysuria and hematuria.  Neurological: Negative for dizziness and headaches.  Hematological: Negative for adenopathy.       Objective:   Physical Exam  Constitutional: He appears well-developed and well-nourished.  Cardiovascular: Normal rate and regular rhythm.   Pulmonary/Chest: Effort normal and breath sounds normal. No respiratory distress. He has no wheezes. He has no rales.  Abdominal: Soft. Bowel sounds are normal. He exhibits no distension. There is no rebound and no guarding.       Incisional/ventral hernia just right of midline. Soft and nontender. Patient has vertical incision from previous surgery. Mildly tender just inferior to umbilicus. No guarding or rebound.  Genitourinary:       No impaction. No rectal mass. Minimal stool brown and heme-negative          Assessment & Plan:  #1 abdominal pain. Poorly localized. No evidence for small bowel obstruction. Possibly related to recent constipation but also consider acute diverticulitis though location is somewhat unusual. Cover with Cipro 500 mg twice a day. Check CBC. Follow up promptly for fever or worsening symptoms #2 hypertension poorly controlled increase lisinopril back to 40 mg daily

## 2011-01-16 NOTE — Patient Instructions (Signed)
Follow up immediately for any fever or worsening pain. 

## 2011-01-17 LAB — CBC WITH DIFFERENTIAL/PLATELET
Basophils Absolute: 0 10*3/uL (ref 0.0–0.1)
Basophils Relative: 0 % (ref 0–1)
Eosinophils Absolute: 0.3 10*3/uL (ref 0.0–0.7)
Eosinophils Relative: 3 % (ref 0–5)
HCT: 39 % (ref 39.0–52.0)
Hemoglobin: 13.3 g/dL (ref 13.0–17.0)
MCH: 32.8 pg (ref 26.0–34.0)
MCHC: 34.1 g/dL (ref 30.0–36.0)
MCV: 96.3 fL (ref 78.0–100.0)
Monocytes Absolute: 1 10*3/uL (ref 0.1–1.0)
Monocytes Relative: 12 % (ref 3–12)
Neutro Abs: 4.9 10*3/uL (ref 1.7–7.7)
RDW: 13.2 % (ref 11.5–15.5)

## 2011-02-17 ENCOUNTER — Telehealth: Payer: Self-pay | Admitting: *Deleted

## 2011-02-17 NOTE — Telephone Encounter (Signed)
Patient is calling because is blood pressure has been elevated with some dizziness.  Appointment was made for Monday.

## 2011-02-20 ENCOUNTER — Encounter: Payer: Self-pay | Admitting: Family Medicine

## 2011-02-20 ENCOUNTER — Ambulatory Visit (INDEPENDENT_AMBULATORY_CARE_PROVIDER_SITE_OTHER): Payer: Medicare Other | Admitting: Family Medicine

## 2011-02-20 VITALS — BP 150/82 | Temp 98.1°F | Wt 203.0 lb

## 2011-02-20 DIAGNOSIS — I10 Essential (primary) hypertension: Secondary | ICD-10-CM

## 2011-02-20 MED ORDER — HYDROCHLOROTHIAZIDE 12.5 MG PO CAPS: 12.5000 mg | ORAL_CAPSULE | Freq: Every day | ORAL | Status: DC

## 2011-02-20 NOTE — Patient Instructions (Signed)

## 2011-02-20 NOTE — Progress Notes (Signed)
  Subjective:    Patient ID: Ronald Gutierrez, male    DOB: [birth year], 78 y.o.   MRN: [id number]  HPI  Followup elevated blood pressure. Several readings recently around 160-170 systolic and 90s to occasionally up to 100 diastolic. Couple days ago felt lightheaded and had what felt like transient vertigo. No ataxia. No headaches. No chest pains. Denies dyspnea or peripheral edema. Takes lisinopril 40 mg daily regularly. Monitors his blood pressure daily.  Past Medical History  Diagnosis Date  . ANEMIA 05/20/2008  . FATIGUE 05/13/2009  . GERD 05/25/2008  . HYPERLIPIDEMIA 05/20/2008  . HYPERTENSION 05/20/2008  . KERATOSIS 10/21/2008  . 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 PERSONAL HISTORY MALIG NEOPLASM LARGE INTESTINE 09/22/2009  . PERSONAL HX COLONIC POLYPS 05/25/2008   Past Surgical History  Procedure Date  . Appendectomy   . Cervical laminectomy   . Lumbar laminectomy   . Hemorrhoid surgery   . Colectomy     transverse  . Omentectomy   . Hernia repair     inguinal    reports that he quit smoking about 43 years ago. His smoking use included Cigarettes. He has a 22.5 pack-year smoking history. He does not have any smokeless tobacco history on file. His alcohol and drug histories not on file. family history is not on file. Allergies  Allergen Reactions  . Ferrous Sulfate     REACTION: Stomach upset       Review of Systems  Constitutional: Negative for fatigue.  Eyes: Negative for visual disturbance.  Respiratory: Negative for cough, chest tightness and shortness of breath.   Cardiovascular: Negative for chest pain, palpitations and leg swelling.  Neurological: Negative for dizziness, syncope, weakness, light-headedness and headaches.       Objective:   Physical Exam  Constitutional: He is oriented to person, place, and time. He appears well-developed and well-nourished.  Neck: Neck supple. No thyromegaly present.  Cardiovascular: Normal rate and regular rhythm.     Pulmonary/Chest: Effort normal and breath sounds normal. No respiratory distress. He has no wheezes. He has no rales.  Musculoskeletal: He exhibits no edema.  Neurological: He is alert and oriented to person, place, and time.          Assessment & Plan:  Hypertension. Suboptimal control. Add Microzide 12.5 mg daily and continue lisinopril 40 mg daily. Reassess blood pressure one month. Advise for potassium rich foods

## 2011-03-14 ENCOUNTER — Other Ambulatory Visit: Payer: Self-pay | Admitting: *Deleted

## 2011-03-14 MED ORDER — LISINOPRIL 40 MG PO TABS: 40.0000 mg | ORAL_TABLET | Freq: Every day | ORAL | Status: DC

## 2011-03-22 ENCOUNTER — Ambulatory Visit (INDEPENDENT_AMBULATORY_CARE_PROVIDER_SITE_OTHER): Payer: Medicare Other | Admitting: Family Medicine

## 2011-03-22 ENCOUNTER — Encounter: Payer: Self-pay | Admitting: Family Medicine

## 2011-03-22 VITALS — BP 118/68 | Temp 97.5°F | Wt 200.0 lb

## 2011-03-22 DIAGNOSIS — I10 Essential (primary) hypertension: Secondary | ICD-10-CM

## 2011-03-22 NOTE — Progress Notes (Signed)
  Subjective:    Patient ID: Ronald Gutierrez, male    DOB: [birth year], 78 y.o.   MRN: [id number]  HPI  Patient seen in followup hypertension. Recent addition Microzide 12.5 mg. Has seen great improvement in blood pressures. Systolic is now ranging around 120s. No orthostatic symptoms. Feels well overall. No headaches. No dizziness. No chest pains. No peripheral edema issues. He stays quite active with golf.  Past Medical History  Diagnosis Date  . ANEMIA 05/20/2008  . FATIGUE 05/13/2009  . GERD 05/25/2008  . HYPERLIPIDEMIA 05/20/2008  . HYPERTENSION 05/20/2008  . KERATOSIS 10/21/2008  . 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 PERSONAL HISTORY MALIG NEOPLASM LARGE INTESTINE 09/22/2009  . PERSONAL HX COLONIC POLYPS 05/25/2008   Past Surgical History  Procedure Date  . Appendectomy   . Cervical laminectomy   . Lumbar laminectomy   . Hemorrhoid surgery   . Colectomy     transverse  . Omentectomy   . Hernia repair     inguinal    reports that he quit smoking about 43 years ago. His smoking use included Cigarettes. He has a 22.5 pack-year smoking history. He does not have any smokeless tobacco history on file. His alcohol and drug histories not on file. family history is not on file. Allergies  Allergen Reactions  . Ferrous Sulfate     REACTION: Stomach upset      Review of Systems  Constitutional: Negative for fatigue.  Eyes: Negative for visual disturbance.  Respiratory: Negative for cough, chest tightness and shortness of breath.   Cardiovascular: Negative for chest pain, palpitations and leg swelling.  Neurological: Negative for dizziness, syncope, weakness, light-headedness and headaches.       Objective:   Physical Exam  Constitutional: He appears well-developed and well-nourished.  Cardiovascular: Normal rate and regular rhythm.   Pulmonary/Chest: Effort normal and breath sounds normal. No respiratory distress. He has no wheezes. He has no rales.  Musculoskeletal:  He exhibits no edema.          Assessment & Plan:  Hypertension. Greatly improved. Continue current regimen. Schedule physical in 6 months

## 2011-04-10 ENCOUNTER — Other Ambulatory Visit: Payer: Self-pay | Admitting: Family Medicine

## 2011-04-11 ENCOUNTER — Encounter: Payer: Self-pay | Admitting: Family Medicine

## 2011-04-11 ENCOUNTER — Ambulatory Visit (INDEPENDENT_AMBULATORY_CARE_PROVIDER_SITE_OTHER): Payer: Medicare Other | Admitting: Family Medicine

## 2011-04-11 VITALS — BP 102/62 | Temp 97.7°F | Wt 198.0 lb

## 2011-04-11 DIAGNOSIS — R509 Fever, unspecified: Secondary | ICD-10-CM

## 2011-04-11 DIAGNOSIS — J111 Influenza due to unidentified influenza virus with other respiratory manifestations: Secondary | ICD-10-CM

## 2011-04-11 LAB — POCT INFLUENZA A/B: Influenza B, POC: POSITIVE

## 2011-04-11 MED ORDER — OSELTAMIVIR PHOSPHATE 75 MG PO CAPS: 75.0000 mg | ORAL_CAPSULE | Freq: Two times a day (BID) | ORAL | Status: AC

## 2011-04-11 NOTE — Patient Instructions (Signed)
Influenza Facts Flu (influenza) is a contagious respiratory illness caused by the influenza viruses. It can cause mild to severe illness. While most healthy people recover from the flu without specific treatment and without complications, older people, young children, and people with certain health conditions are at higher risk for serious complications from the flu, including death. CAUSES   The flu virus is spread from person to person by respiratory droplets from coughing and sneezing.   A person can also become infected by touching an object or surface with a virus on it and then touching their mouth, eye or nose.   Adults may be able to infect others from 1 day before symptoms occur and up to 7 days after getting sick. So it is possible to give someone the flu even before you know you are sick and continue to infect others while you are sick.  SYMPTOMS   Fever (usually high).   Headache.   Tiredness (can be extreme).   Cough.   Sore throat.   Runny or stuffy nose.   Body aches.   Diarrhea and vomiting may also occur, particularly in children.   These symptoms are referred to as "flu-like symptoms". A lot of different illnesses, including the common cold, can have similar symptoms.  DIAGNOSIS   There are tests that can determine if you have the flu as long you are tested within the first 2 or 3 days of illness.   A doctor's exam and additional tests may be needed to identify if you have a disease that is a complicating the flu.  RISKS AND COMPLICATIONS  Some of the complications caused by the flu include:  Bacterial pneumonia or progressive pneumonia caused by the flu virus.   Loss of body fluids (dehydration).   Worsening of chronic medical conditions, such as heart failure, asthma, or diabetes.   Sinus problems and ear infections.  HOME CARE INSTRUCTIONS   Seek medical care early on.   If you are at high risk from complications of the flu, consult your health-care  provider as soon as you develop flu-like symptoms. Those at high risk for complications include:   People 65 years or older.   People with chronic medical conditions, including diabetes.   Pregnant women.   Young children.   Your caregiver may recommend use of an antiviral medication to help treat the flu.   If you get the flu, get plenty of rest, drink a lot of liquids, and avoid using alcohol and tobacco.   You can take over-the-counter medications to relieve the symptoms of the flu if your caregiver approves. (Never give aspirin to children or teenagers who have flu-like symptoms, particularly fever).  PREVENTION  The single best way to prevent the flu is to get a flu vaccine each fall. Other measures that can help protect against the flu are:  Antiviral Medications   A number of antiviral drugs are approved for use in preventing the flu. These are prescription medications, and a doctor should be consulted before they are used.   Habits for Good Health   Cover your nose and mouth with a tissue when you cough or sneeze, throw the tissue away after you use it.   Wash your hands often with soap and water, especially after you cough or sneeze. If you are not near water, use an alcohol-based hand cleaner.   Avoid people who are sick.   If you get the flu, stay home from work or school. Avoid contact with   other people so that you do not make them sick, too.   Try not to touch your eyes, nose, or mouth as germs ore often spread this way.  IN CHILDREN, EMERGENCY WARNING SIGNS THAT NEED URGENT MEDICAL ATTENTION:  Fast breathing or trouble breathing.   Bluish skin color.   Not drinking enough fluids.   Not waking up or not interacting.   Being so irritable that the child does not want to be held.   Flu-like symptoms improve but then return with fever and worse cough.   Fever with a rash.  IN ADULTS, EMERGENCY WARNING SIGNS THAT NEED URGENT MEDICAL ATTENTION:  Difficulty  breathing or shortness of breath.   Pain or pressure in the chest or abdomen.   Sudden dizziness.   Confusion.   Severe or persistent vomiting.  SEEK IMMEDIATE MEDICAL CARE IF:  You or someone you know is experiencing any of the symptoms above. When you arrive at the emergency center,report that you think you have the flu. You may be asked to wear a mask and/or sit in a secluded area to protect others from getting sick. MAKE SURE YOU:   Understand these instructions.   Monitor your condition.   Seek medical care if you are getting worse, or not improving.  Document Released: 01/05/2003 Document Revised: 12/22/2010 Document Reviewed: 10/01/2008 [employer] Patient Information 2012 [employer]. 

## 2011-04-11 NOTE — Progress Notes (Signed)
  Subjective:    Patient ID: Ronald Gutierrez, male    DOB: 12-15-1926, 78 y.o.   MRN: [id number]  HPI  Acute febrile illness. Patient started with fever last night 102.2. Fever broke during the night with diaphoresis. He had fatigue and body aches. He has some nasal congestion and cough. One loose stool but no recurrent diarrhea and no vomiting. No skin rashes. No headache. Drinking fluids well. Denies dyspnea.  No dysuria.  No major headache.  Sister died tragically of motor vehicle accident last week. He has funeral in a few days.  Past Medical History  Diagnosis Date  . ANEMIA 05/20/2008  . FATIGUE 05/13/2009  . GERD 05/25/2008  . HYPERLIPIDEMIA 05/20/2008  . HYPERTENSION 05/20/2008  . KERATOSIS 10/21/2008  . 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 PERSONAL HISTORY MALIG NEOPLASM LARGE INTESTINE 09/22/2009  . PERSONAL HX COLONIC POLYPS 05/25/2008   Past Surgical History  Procedure Date  . Appendectomy   . Cervical laminectomy   . Lumbar laminectomy   . Hemorrhoid surgery   . Colectomy     transverse  . Omentectomy   . Hernia repair     inguinal    reports that he quit smoking about 43 years ago. His smoking use included Cigarettes. He has a 22.5 pack-year smoking history. He does not have any smokeless tobacco history on file. His alcohol and drug histories not on file. family history is not on file. Allergies  Allergen Reactions  . Ferrous Sulfate     REACTION: Stomach upset      Review of Systems  Constitutional: Positive for fever, chills, diaphoresis and fatigue.  HENT: Positive for congestion. Negative for sore throat.   Respiratory: Positive for cough. Negative for shortness of breath and wheezing.   Gastrointestinal: Negative for abdominal pain.  Genitourinary: Negative for dysuria.  Skin: Negative for rash.       Objective:   Physical Exam  Constitutional: He is oriented to person, place, and time. He appears well-developed and well-nourished.  HENT:    Right Ear: External ear normal.  Left Ear: External ear normal.  Mouth/Throat: Oropharynx is clear and moist.  Neck: Neck supple.  Cardiovascular: Normal rate and regular rhythm.   Pulmonary/Chest: Effort normal and breath sounds normal.       Question faint rales right base otherwise clear-?atelectasis as cleared somewhat with deep breathing.. No wheezes.  No retractions. Normal respiratory rate  Abdominal: Soft. There is no tenderness.  Musculoskeletal: He exhibits no edema.  Lymphadenopathy:    He has no cervical adenopathy.  Neurological: He is alert and oriented to person, place, and time.  Skin: No rash noted.          Assessment & Plan:  Acute febrile illness. Check influenza screen. He did not receive immunization for flu because of egg allergy. If positive consider Tamiflu given < 24 hours duration.  Influenza screen positive.  Tamiflu 75 mg bid and plenty of fluids.  Prompt follow up for any vomiting or worsening symptoms.

## 2011-04-13 ENCOUNTER — Encounter: Payer: Self-pay | Admitting: Family Medicine

## 2011-04-13 ENCOUNTER — Ambulatory Visit (INDEPENDENT_AMBULATORY_CARE_PROVIDER_SITE_OTHER): Payer: Medicare Other | Admitting: Family Medicine

## 2011-04-13 DIAGNOSIS — J111 Influenza due to unidentified influenza virus with other respiratory manifestations: Secondary | ICD-10-CM

## 2011-04-13 MED ORDER — LEVOFLOXACIN 500 MG PO TABS: 500.0000 mg | ORAL_TABLET | Freq: Every day | ORAL | Status: AC

## 2011-04-13 NOTE — Progress Notes (Signed)
  Subjective:    Patient ID: Ronald Gutierrez, male    DOB: [birth year], 78 y.o.   MRN: [id number]  HPI  Followup positive influenza screen. Still feels poorly. Bodyaches but fever resolved yesterday.   Had some nausea yesterday but no vomiting. No diarrhea. Cough which is mostly nonproductive. Fever was up to 100 yesterday. Poor appetite. Taking Tamiflu 75 mg twice daily. Wife now has similar symptoms. Patient hopes to fly Saturday to sister's funeral   Review of Systems  Constitutional: Positive for chills. Negative for fever.  HENT: Negative for sore throat, trouble swallowing and neck stiffness.   Respiratory: Positive for cough. Negative for shortness of breath and wheezing.   Cardiovascular: Negative for chest pain and leg swelling.  Gastrointestinal: Negative for vomiting, abdominal pain and diarrhea.       Objective:   Physical Exam  Constitutional: He appears well-developed and well-nourished.  HENT:  Mouth/Throat: Oropharynx is clear and moist.  Neck: Neck supple.  Cardiovascular: Normal rate and regular rhythm.   Pulmonary/Chest: Effort normal.       Patient has a few faint crackles right base. No increased work of breathing. No retractions. No wheezes.  Skin: No rash noted.          Assessment & Plan:  Influenza. Patient has productive cough and mild asymmetry in breath sounds as above. Add Levaquin 500 milligrams once daily for 7 days and finish out Tamiflu.  CXR if not steadily improving over next few days.

## 2011-05-03 ENCOUNTER — Encounter: Payer: Self-pay | Admitting: Family Medicine

## 2011-05-03 ENCOUNTER — Ambulatory Visit (INDEPENDENT_AMBULATORY_CARE_PROVIDER_SITE_OTHER): Payer: Medicare Other | Admitting: Family Medicine

## 2011-05-03 VITALS — BP 84/50 | Temp 97.4°F | Wt 192.0 lb

## 2011-05-03 DIAGNOSIS — I10 Essential (primary) hypertension: Secondary | ICD-10-CM

## 2011-05-03 DIAGNOSIS — R531 Weakness: Secondary | ICD-10-CM

## 2011-05-03 DIAGNOSIS — R5381 Other malaise: Secondary | ICD-10-CM

## 2011-05-03 DIAGNOSIS — I951 Orthostatic hypotension: Secondary | ICD-10-CM

## 2011-05-03 LAB — BASIC METABOLIC PANEL
CO2: 23 mEq/L (ref 19–32)
Chloride: 105 mEq/L (ref 96–112)
Creatinine, Ser: 1.2 mg/dL (ref 0.4–1.5)
Glucose, Bld: 97 mg/dL (ref 70–99)

## 2011-05-03 MED ORDER — OMEPRAZOLE 20 MG PO CPDR: 20.0000 mg | DELAYED_RELEASE_CAPSULE | Freq: Every day | ORAL | Status: DC

## 2011-05-03 NOTE — Patient Instructions (Signed)
Hold HCTZ for now. Take one half of lisinopril for next few days and then resume regular dose if symptoms resolved.

## 2011-05-03 NOTE — Progress Notes (Signed)
  Subjective:    Patient ID: Ronald Gutierrez, male    DOB: 03-26-1926, 78 y.o.   MRN: [id number]  HPI  Patient seen for medical followup. Recent influenza. Patient had poor appetite since then but fever is fully resolved. He is not eating as well but is drinking fluids. He is feeling extremely fatigued and lightheaded with standing but no syncope. Infectious symptoms are fully resolved. Has lost about 10 pounds. Denies any vomiting or diarrhea. Frequent waking up at night. Recent loss of sister through accident. Patient takes lisinopril 40 mg daily and Microzide 12.5 mg daily for hypertension. Denies any recent cough, headache, dysuria.  Past Medical History  Diagnosis Date  . ANEMIA 05/20/2008  . FATIGUE 05/13/2009  . GERD 05/25/2008  . HYPERLIPIDEMIA 05/20/2008  . HYPERTENSION 05/20/2008  . KERATOSIS 10/21/2008  . 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 PERSONAL HISTORY MALIG NEOPLASM LARGE INTESTINE 09/22/2009  . PERSONAL HX COLONIC POLYPS 05/25/2008   Past Surgical History  Procedure Date  . Appendectomy   . Cervical laminectomy   . Lumbar laminectomy   . Hemorrhoid surgery   . Colectomy     transverse  . Omentectomy   . Hernia repair     inguinal    reports that he quit smoking about 43 years ago. His smoking use included Cigarettes. He has a 22.5 pack-year smoking history. He does not have any smokeless tobacco history on file. His alcohol and drug histories not on file. family history is not on file. Allergies  Allergen Reactions  . Ferrous Sulfate     REACTION: Stomach upset      Review of Systems  Constitutional: Positive for fatigue and unexpected weight change. Negative for fever and chills.  Respiratory: Negative for cough, shortness of breath and wheezing.   Cardiovascular: Negative for chest pain, palpitations and leg swelling.  Gastrointestinal: Negative for nausea, vomiting, abdominal pain and diarrhea.  Genitourinary: Negative for dysuria.  Skin: Negative  for rash.  Neurological: Positive for dizziness and weakness. Negative for syncope.  Hematological: Negative for adenopathy. Does not bruise/bleed easily.       Objective:   Physical Exam  Constitutional: He is oriented to person, place, and time. He appears well-developed and well-nourished.  HENT:  Mouth/Throat: Oropharynx is clear and moist.  Neck: Neck supple. No thyromegaly present.  Cardiovascular: Normal rate and regular rhythm.   Pulmonary/Chest: Effort normal and breath sounds normal. No respiratory distress. He has no wheezes. He has no rales.  Musculoskeletal: He exhibits no edema.  Lymphadenopathy:    He has no cervical adenopathy.  Neurological: He is alert and oriented to person, place, and time. No cranial nerve deficit.  Skin: No rash noted.  Psychiatric: He has a normal mood and affect. His behavior is normal.          Assessment & Plan:  Patient presents with generalized weakness and orthostasis with standing blood pressure 84/50. Suspect related to recent weight loss and decreased intake. Check basic metabolic panel. Hold HCTZ. Reduce lisinopril to 20 mg daily and give only if systolic blood pressure over 100. Reassess 2 weeks or sooner as needed. Short-term use of fluids such as Gatorade

## 2011-05-05 NOTE — Progress Notes (Signed)
Quick Note:  Pt informed ______ 

## 2011-05-17 ENCOUNTER — Encounter: Payer: Self-pay | Admitting: Family Medicine

## 2011-05-17 ENCOUNTER — Ambulatory Visit (INDEPENDENT_AMBULATORY_CARE_PROVIDER_SITE_OTHER): Payer: Medicare Other | Admitting: Family Medicine

## 2011-05-17 VITALS — BP 130/70 | Temp 97.9°F | Wt 197.0 lb

## 2011-05-17 DIAGNOSIS — I10 Essential (primary) hypertension: Secondary | ICD-10-CM

## 2011-05-17 NOTE — Patient Instructions (Signed)
OK to titrate lisinopril back up to 40 mg daily. Continue to hold HCTZ

## 2011-05-17 NOTE — Progress Notes (Signed)
  Subjective:    Patient ID: Ronald Gutierrez, male    DOB: [birth year], 78 y.o.   MRN: [id number]  HPI  Patient seen for two-week followup. Recent influenza followed by respiratory illness and had some weight loss and subsequent drop in blood pressure. Orthostatic symptoms last visit we stopped his HCTZ and reduced his lisinopril. His blood pressure is much improved he has gained back 5 pounds. Respiratory symptoms have resolved. No nausea or vomiting. Blood pressures generally ranging 130-140 systolic range with occasional high of 150. Overall feels greatly improved. Weakness has resolved and he has returned to playing golf 2 times per week  Past Medical History  Diagnosis Date  . ANEMIA 05/20/2008  . FATIGUE 05/13/2009  . GERD 05/25/2008  . HYPERLIPIDEMIA 05/20/2008  . HYPERTENSION 05/20/2008  . KERATOSIS 10/21/2008  . 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 PERSONAL HISTORY MALIG NEOPLASM LARGE INTESTINE 09/22/2009  . PERSONAL HX COLONIC POLYPS 05/25/2008   Past Surgical History  Procedure Date  . Appendectomy   . Cervical laminectomy   . Lumbar laminectomy   . Hemorrhoid surgery   . Colectomy     transverse  . Omentectomy   . Hernia repair     inguinal    reports that he quit smoking about 43 years ago. His smoking use included Cigarettes. He has a 22.5 pack-year smoking history. He does not have any smokeless tobacco history on file. His alcohol and drug histories not on file. family history is not on file. Allergies  Allergen Reactions  . Ferrous Sulfate     REACTION: Stomach upset      Review of Systems  Constitutional: Negative for fever, appetite change, fatigue and unexpected weight change.  Respiratory: Negative for cough and shortness of breath.   Cardiovascular: Negative for chest pain and leg swelling.  Neurological: Negative for dizziness and weakness.       Objective:   Physical Exam  Constitutional: He is oriented to person, place, and time. He appears  well-developed and well-nourished.  HENT:  Mouth/Throat: Oropharynx is clear and moist.  Cardiovascular: Normal rate and regular rhythm.   Pulmonary/Chest: Effort normal and breath sounds normal. No respiratory distress. He has no wheezes. He has no rales.  Musculoskeletal: He exhibits no edema.  Neurological: He is alert and oriented to person, place, and time.          Assessment & Plan:  Orthostasis. Resolved. Blood pressures currently stable. He'll increase lisinopril back to 40 mg daily and continue to hold HCTZ. Followup in 4 months and lab work again including lipids, hepatic, and TSH.

## 2011-06-26 ENCOUNTER — Other Ambulatory Visit: Payer: Self-pay | Admitting: Family Medicine

## 2011-09-15 ENCOUNTER — Other Ambulatory Visit: Payer: Self-pay | Admitting: Family Medicine

## 2011-09-20 ENCOUNTER — Ambulatory Visit (INDEPENDENT_AMBULATORY_CARE_PROVIDER_SITE_OTHER): Payer: Medicare Other | Admitting: Family Medicine

## 2011-09-20 ENCOUNTER — Encounter: Payer: Self-pay | Admitting: Family Medicine

## 2011-09-20 VITALS — BP 130/68 | Temp 97.6°F | Wt 200.0 lb

## 2011-09-20 DIAGNOSIS — C184 Malignant neoplasm of transverse colon: Secondary | ICD-10-CM

## 2011-09-20 DIAGNOSIS — I10 Essential (primary) hypertension: Secondary | ICD-10-CM

## 2011-09-20 DIAGNOSIS — E785 Hyperlipidemia, unspecified: Secondary | ICD-10-CM

## 2011-09-20 DIAGNOSIS — E039 Hypothyroidism, unspecified: Secondary | ICD-10-CM

## 2011-09-20 LAB — BASIC METABOLIC PANEL
BUN: 26 mg/dL — ABNORMAL HIGH (ref 6–23)
Chloride: 106 mEq/L (ref 96–112)
Creatinine, Ser: 1.4 mg/dL (ref 0.4–1.5)

## 2011-09-20 LAB — HEPATIC FUNCTION PANEL
Alkaline Phosphatase: 69 U/L (ref 39–117)
Bilirubin, Direct: 0.1 mg/dL (ref 0.0–0.3)
Total Bilirubin: 0.8 mg/dL (ref 0.3–1.2)
Total Protein: 7.6 g/dL (ref 6.0–8.3)

## 2011-09-20 LAB — LIPID PANEL
Cholesterol: 147 mg/dL (ref 0–200)
LDL Cholesterol: 60 mg/dL (ref 0–99)

## 2011-09-20 MED ORDER — LISINOPRIL 40 MG PO TABS: 40.0000 mg | ORAL_TABLET | Freq: Every day | ORAL | Status: DC

## 2011-09-20 NOTE — Progress Notes (Signed)
  Subjective:    Patient ID: Ronald Gutierrez, male    DOB: 08-Feb-1926, 78 y.o.   MRN: [id number]  HPI  Medical followup. Patient has history of hypertension, hyperlipidemia, GERD, history of colon cancer, and hypothyroidism. He is overdue for repeat colonoscopy in is willing to let us set this up at this time. Has recently had some increased fatigue issues. No dyspnea. No chest pain. Had episode about a week ago where he felt slightly lightheaded and dizzy but no syncope. Was hot and humid that day and perhaps inadequate fluid consumption.  Blood pressure well controlled by home readings. Compliant with all medications. No consistent orthostatic symptoms. Patient did have some orthostasis several months ago with combination lisinopril with diuretic and we discontinued his diuretic.  Past Medical History  Diagnosis Date  . ANEMIA 05/20/2008  . FATIGUE 05/13/2009  . GERD 05/25/2008  . HYPERLIPIDEMIA 05/20/2008  . HYPERTENSION 05/20/2008  . KERATOSIS 10/21/2008  . 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 PERSONAL HISTORY MALIG NEOPLASM LARGE INTESTINE 09/22/2009  . PERSONAL HX COLONIC POLYPS 05/25/2008   Past Surgical History  Procedure Date  . Appendectomy   . Cervical laminectomy   . Lumbar laminectomy   . Hemorrhoid surgery   . Colectomy     transverse  . Omentectomy   . Hernia repair     inguinal    reports that he quit smoking about 43 years ago. His smoking use included Cigarettes. He has a 22.5 pack-year smoking history. He does not have any smokeless tobacco history on file. His alcohol and drug histories not on file. family history is not on file. Allergies  Allergen Reactions  . Ferrous Sulfate     REACTION: Stomach upset      Review of Systems  Constitutional: Positive for fatigue. Negative for fever and chills.  Respiratory: Negative for cough and shortness of breath.   Cardiovascular: Negative for chest pain, palpitations and leg swelling.  Gastrointestinal:  Negative for abdominal pain.  Neurological: Positive for dizziness. Negative for seizures, syncope, weakness and headaches.       Objective:   Physical Exam  Constitutional: He is oriented to person, place, and time. He appears well-developed and well-nourished.  Neck: Neck supple. No thyromegaly present.  Cardiovascular: Normal rate and regular rhythm.   Pulmonary/Chest: Effort normal and breath sounds normal. No respiratory distress. He has no wheezes. He has no rales.  Musculoskeletal: He exhibits no edema.  Neurological: He is alert and oriented to person, place, and time.          Assessment & Plan:  #1 hypertension. Stable. Refill lisinopril for one year #2 hyperlipidemia. Check lipid and hepatic panel  #3 hypothyroidism. Recheck TSH #4 history of colon cancer. Referral back to GI for repeat colonoscopy. Repeat CEA level

## 2011-09-21 NOTE — Progress Notes (Signed)
Quick Note:  Left a message for pt to return call. ______ 

## 2011-09-21 NOTE — Progress Notes (Signed)
Quick Note:  Pt informed on personally identified home VM ______ 

## 2011-11-22 ENCOUNTER — Ambulatory Visit (INDEPENDENT_AMBULATORY_CARE_PROVIDER_SITE_OTHER): Payer: Medicare Other | Admitting: Gastroenterology

## 2011-11-22 ENCOUNTER — Encounter: Payer: Self-pay | Admitting: Gastroenterology

## 2011-11-22 VITALS — BP 138/80 | HR 86 | Ht 69.0 in | Wt 201.2 lb

## 2011-11-22 DIAGNOSIS — Z86010 Personal history of colonic polyps: Secondary | ICD-10-CM

## 2011-11-22 DIAGNOSIS — Z85038 Personal history of other malignant neoplasm of large intestine: Secondary | ICD-10-CM

## 2011-11-22 MED ORDER — PEG-KCL-NACL-NASULF-NA ASC-C 100 G PO SOLR: 1.0000 | Freq: Once | ORAL | Status: DC

## 2011-11-22 NOTE — Patient Instructions (Addendum)
You have been scheduled for a colonoscopy with propofol. Please follow written instructions given to you at your visit today.  Please pick up your prep kit at the pharmacy within the next 1-3 days. If you use inhalers (even only as needed) or a CPAP machine, please bring them with you on the day of your procedure.  

## 2011-11-22 NOTE — Progress Notes (Signed)
History of Present Illness: This is an 78 year old male with a history of T4 colon adenocarcinoma in 2010 and has had recurrent polyps on followup colonoscopies requiring piecemeal polypectomy. He has no gastrointestinal complaints. He has stable hypertension, hyperlipidemia, hypothyroidism and GERD. Denies weight loss, abdominal pain, constipation, diarrhea, change in stool caliber, melena, hematochezia, nausea, vomiting, dysphagia, reflux symptoms, chest pain.  Current Medications, Allergies, Past Medical History, Past Surgical History, Family History and Social History were reviewed in [employer] record.  Physical Exam: General: Well developed , well nourished, no acute distress Head: Normocephalic and atraumatic Eyes:  sclerae anicteric, EOMI Ears: Normal auditory acuity Mouth: No deformity or lesions Lungs: Clear throughout to auscultation Heart: Regular rate and rhythm; no murmurs, rubs or bruits Abdomen: Soft, non tender and non distended. No masses, hepatosplenomegaly or hernias noted. Normal Bowel sounds Rectal: Deferred to colonoscopy Musculoskeletal: Symmetrical with no gross deformities  Pulses:  Normal pulses noted Extremities: No clubbing, cyanosis, edema or deformities noted Neurological: Alert oriented x 4, grossly nonfocal Psychological:  Alert and cooperative. Normal mood and affect  Assessment and Recommendations:  1. Personal history of T4 colon adenocarcinoma in 2010 and recurrent adenomatous polyps in 2011. His health status is very good and he is at high risk for recurrent colon polyps and colon cancer. Schedule colonoscopy. The risks, benefits, and alternatives to colonoscopy with possible biopsy and possible polypectomy were discussed with the patient and they consent to proceed.

## 2011-12-27 ENCOUNTER — Ambulatory Visit (AMBULATORY_SURGERY_CENTER): Payer: Medicare Other | Admitting: Gastroenterology

## 2011-12-27 ENCOUNTER — Encounter: Payer: Self-pay | Admitting: Gastroenterology

## 2011-12-27 VITALS — BP 134/85 | HR 54 | Temp 96.3°F | Resp 17 | Ht 69.0 in | Wt 201.0 lb

## 2011-12-27 DIAGNOSIS — D126 Benign neoplasm of colon, unspecified: Secondary | ICD-10-CM

## 2011-12-27 DIAGNOSIS — Z1211 Encounter for screening for malignant neoplasm of colon: Secondary | ICD-10-CM

## 2011-12-27 DIAGNOSIS — Z85038 Personal history of other malignant neoplasm of large intestine: Secondary | ICD-10-CM

## 2011-12-27 DIAGNOSIS — Z86010 Personal history of colonic polyps: Secondary | ICD-10-CM

## 2011-12-27 MED ORDER — SODIUM CHLORIDE 0.9 % IV SOLN: 500.0000 mL | INTRAVENOUS | Status: DC

## 2011-12-27 NOTE — Progress Notes (Signed)
Called to room to assist during endoscopic procedure.  Patient ID and intended procedure confirmed with present staff. Received instructions for my participation in the procedure from the performing physician.  

## 2011-12-27 NOTE — Op Note (Signed)
[facility] [address] [place], [address]   COLONOSCOPY PROCEDURE REPORT  PATIENT: Ronald Gutierrez, Ronald Gutierrez  MR#: #[id number] BIRTHDATE: [birth year] , 85  yrs. old GENDER: Male ENDOSCOPIST: Meryl Dare, MD, [facility] PROCEDURE DATE:  12/27/2011 PROCEDURE:   Colonoscopy with snare polypectomy ASA CLASS:   Class II INDICATIONS:High risk patient with personal history of colon cancer and patient's personal history of adenomatous colon polyps. MEDICATIONS: MAC sedation, administered by CRNA and propofol (Diprivan) 150mg  IV DESCRIPTION OF PROCEDURE:   After the risks benefits and alternatives of the procedure were thoroughly explained, informed consent was obtained.  A digital rectal exam revealed no abnormalities of the rectum.   The LB [id number]  endoscope was introduced through the anus and advanced to the cecum, which was identified by both the appendix and ileocecal valve. No adverse events experienced.   The quality of the prep was good, using MoviPrep  The instrument was then slowly withdrawn as the colon was fully examined.  COLON FINDINGS: Two sessile polyps measuring 6-7 mm in size were found in the descending colon.  A polypectomy was performed with a cold snare.  The resection was complete and the polyp tissue was completely retrieved.  Moderate diverticulosis was noted in the descending colon and sigmoid colon.  Prior segmental transverse colectomy and prior tattoo in transverse colon.   Mild diverticulosis was noted in the transverse colon. The colon was otherwise normal. There was no diverticulosis, inflammation, polyps or cancers unless previously stated. Retroflexed views revealed internal hemorrhoids. The time to cecum=1 minutes 04 seconds. Withdrawal time=8 minutes 33 seconds.  The scope was withdrawn and the procedure completed.  COMPLICATIONS: There were no complications.  ENDOSCOPIC IMPRESSION: 1.   Two sessile polyps measuring 6-7 mm in the  descending colon; polypectomy was performed with a cold snare 2.   Moderate diverticulosis was noted in the descending colon and sigmoid colon 3.   Prior segmental transverse colectomy and prior tattoo in transverse colon. 4.   Mild diverticulosis was noted in the transverse colon 5.   Small internal hemorrhoids  RECOMMENDATIONS: 1.  Await pathology results 2.  Consider repeat Colonoscopy in 2 years if health status appropriate   eSigned:  Meryl Dare, MD, [facility] 12/27/2011 4:34 PM      PATIENT NAME:  Ronald Gutierrez, Ronald Gutierrez MR#: #[id number]

## 2011-12-27 NOTE — Progress Notes (Signed)
Patient did not experience any of the following events: a burn prior to discharge; a fall within the facility; wrong site/side/patient/procedure/implant event; or a hospital transfer or hospital admission upon discharge from the facility. (G8907) Patient did not have preoperative order for IV antibiotic SSI prophylaxis. (G8918)  

## 2011-12-27 NOTE — Patient Instructions (Addendum)
Discharge instructions given with verbal understanding. Handouts on polyps,diverticulosis and hemorrhoids given. Resume previous medications. YOU HAD AN ENDOSCOPIC PROCEDURE TODAY AT THE  ENDOSCOPY CENTER: Refer to the procedure report that was given to you for any specific questions about what was found during the examination.  If the procedure report does not answer your questions, please call your gastroenterologist to clarify.  If you requested that your care partner not be given the details of your procedure findings, then the procedure report has been included in a sealed envelope for you to review at your convenience later.  YOU SHOULD EXPECT: Some feelings of bloating in the abdomen. Passage of more gas than usual.  Walking can help get rid of the air that was put into your GI tract during the procedure and reduce the bloating. If you had a lower endoscopy (such as a colonoscopy or flexible sigmoidoscopy) you may notice spotting of blood in your stool or on the toilet paper. If you underwent a bowel prep for your procedure, then you may not have a normal bowel movement for a few days.  DIET: Your first meal following the procedure should be a light meal and then it is ok to progress to your normal diet.  A half-sandwich or bowl of soup is an example of a good first meal.  Heavy or fried foods are harder to digest and may make you feel nauseous or bloated.  Likewise meals heavy in dairy and vegetables can cause extra gas to form and this can also increase the bloating.  Drink plenty of fluids but you should avoid alcoholic beverages for 24 hours.  ACTIVITY: Your care partner should take you home directly after the procedure.  You should plan to take it easy, moving slowly for the rest of the day.  You can resume normal activity the day after the procedure however you should NOT DRIVE or use heavy machinery for 24 hours (because of the sedation medicines used during the test).    SYMPTOMS TO  REPORT IMMEDIATELY: A gastroenterologist can be reached at any hour.  During normal business hours, 8:30 AM to 5:00 PM Monday through Friday, call [phone].  After hours and on weekends, please call the GI answering service at [phone] who will take a message and have the physician on call contact you.   Following lower endoscopy (colonoscopy or flexible sigmoidoscopy):  Excessive amounts of blood in the stool  Significant tenderness or worsening of abdominal pains  Swelling of the abdomen that is new, acute  Fever of 100F or higher  FOLLOW UP: If any biopsies were taken you will be contacted by phone or by letter within the next 1-3 weeks.  Call your gastroenterologist if you have not heard about the biopsies in 3 weeks.  Our staff will call the home number listed on your records the next business day following your procedure to check on you and address any questions or concerns that you may have at that time regarding the information given to you following your procedure. This is a courtesy call and so if there is no answer at the home number and we have not heard from you through the emergency physician on call, we will assume that you have returned to your regular daily activities without incident.  SIGNATURES/CONFIDENTIALITY: You and/or your care partner have signed paperwork which will be entered into your electronic medical record.  These signatures attest to the fact that that the information above on your After Visit   Summary has been reviewed and is understood.  Full responsibility of the confidentiality of this discharge information lies with you and/or your care-partner.  

## 2011-12-28 ENCOUNTER — Telehealth: Payer: Self-pay | Admitting: *Deleted

## 2011-12-28 NOTE — Telephone Encounter (Signed)
Left message

## 2012-01-01 ENCOUNTER — Encounter: Payer: Self-pay | Admitting: Gastroenterology

## 2012-03-27 ENCOUNTER — Ambulatory Visit (INDEPENDENT_AMBULATORY_CARE_PROVIDER_SITE_OTHER): Payer: Medicare Other | Admitting: Family Medicine

## 2012-03-27 ENCOUNTER — Encounter: Payer: Self-pay | Admitting: Family Medicine

## 2012-03-27 VITALS — BP 150/78 | Temp 97.6°F | Wt 203.0 lb

## 2012-03-27 DIAGNOSIS — S90129A Contusion of unspecified lesser toe(s) without damage to nail, initial encounter: Secondary | ICD-10-CM

## 2012-03-27 DIAGNOSIS — R197 Diarrhea, unspecified: Secondary | ICD-10-CM

## 2012-03-27 DIAGNOSIS — S90229A Contusion of unspecified lesser toe(s) with damage to nail, initial encounter: Secondary | ICD-10-CM

## 2012-03-27 DIAGNOSIS — R109 Unspecified abdominal pain: Secondary | ICD-10-CM

## 2012-03-27 DIAGNOSIS — R195 Other fecal abnormalities: Secondary | ICD-10-CM

## 2012-03-27 LAB — CBC WITH DIFFERENTIAL/PLATELET
Eosinophils Relative: 3.2 % (ref 0.0–5.0)
Monocytes Relative: 10.8 % (ref 3.0–12.0)
Neutrophils Relative %: 61 % (ref 43.0–77.0)
Platelets: 294 10*3/uL (ref 150.0–400.0)
WBC: 8.2 10*3/uL (ref 4.5–10.5)

## 2012-03-27 NOTE — Progress Notes (Signed)
  Subjective:    Patient ID: Ronald Gutierrez, male    DOB: [birth year], 79 y.o.   MRN: [id number]  HPI Acute visit. Patient has history of colon cancer. Repeat colonoscopy back in December with a couple polyps.  Patient doing well until this past Sunday night. About 6 hours after eating dinner he describes recurrent watery diarrhea which was black in color and some vomiting. He had diffuse abdominal cramps. The symptoms are now fully resolved. Does not recall any fever. No bright blood per rectum.  History of diverticulosis but no history of known diverticulosis bleed. Appetite and weight stable Stools are back to normal at this time  Some complaints of discolored toenails. Denies injury. Frequently plays golf and thinks golf shoes may be too tight.  Past Medical History  Diagnosis Date  . ANEMIA   . FATIGUE 05/13/2009  . GERD 05/25/2008  . HYPERLIPIDEMIA 05/20/2008  . HYPERTENSION 05/20/2008  . KERATOSIS 10/21/2008  . Hypothyroidism   . Diverticulitis   . Colon adenocarcinoma 05/2008    T4, N0  . Guillain-Barre syndrome   . Tubular adenoma of colon 10/2009   Past Surgical History  Procedure Laterality Date  . Appendectomy  1948  . Cervical laminectomy  1968  . Lumbar laminectomy  1970  . Hemorrhoid surgery    . Colectomy  07/2008    transverse  . Omentectomy    . Hernia repair      inguinal  . Cataract extraction      reports that he quit smoking about 44 years ago. His smoking use included Cigarettes. He has a 22.5 pack-year smoking history. He has never used smokeless tobacco. He reports that  drinks alcohol. He reports that he does not use illicit drugs. family history includes Heart disease in his brother; Lung cancer in his brother; Prostate cancer in his brother; and Stroke in his father. Allergies  Allergen Reactions  . Ferrous Sulfate     REACTION: Stomach upset      Review of Systems  Constitutional: Negative for fever, chills, appetite change and unexpected weight  change.  Respiratory: Negative for shortness of breath.   Cardiovascular: Negative for chest pain.  Gastrointestinal: Positive for diarrhea. Negative for constipation, blood in stool and abdominal distention.  Neurological: Negative for dizziness and syncope.       Objective:   Physical Exam  Constitutional: He appears well-developed and well-nourished.  Cardiovascular: Normal rate and regular rhythm.   Pulmonary/Chest: Effort normal and breath sounds normal. No respiratory distress. He has no wheezes. He has no rales.  Abdominal: Soft. Bowel sounds are normal. He exhibits no distension and no mass. There is no tenderness. There is no rebound and no guarding.  Genitourinary:  Normal sphincter tone. Brown heme-negative stool. No mass  Musculoskeletal:  Small bilateral subungual hematomas both great toenails.          Assessment & Plan:  Recent acute diarrhea with diffuse abdominal cramping. Suspect this was most likely viral. He's not describing likely melena. Doubt diverticulosis bleed as symptoms are already clearing and heme neg at this time.. Check CBC. Followup promptly for any recurrent diarrhea or abdominal cramping  Bilateral subungual hematomas. Relatively small and asymptomatic at this time.. Occurred several weeks ago. Reassurance

## 2012-03-28 NOTE — Progress Notes (Signed)
Quick Note:  Pt informed ______ 

## 2012-04-03 ENCOUNTER — Other Ambulatory Visit: Payer: Self-pay | Admitting: Family Medicine

## 2012-06-20 ENCOUNTER — Other Ambulatory Visit: Payer: Self-pay | Admitting: Family Medicine

## 2012-06-26 ENCOUNTER — Other Ambulatory Visit: Payer: Self-pay | Admitting: Family Medicine

## 2012-09-17 ENCOUNTER — Other Ambulatory Visit: Payer: Self-pay | Admitting: Family Medicine

## 2012-10-04 ENCOUNTER — Other Ambulatory Visit: Payer: Self-pay | Admitting: Family Medicine

## 2012-12-04 ENCOUNTER — Encounter: Payer: Self-pay | Admitting: Family Medicine

## 2012-12-04 ENCOUNTER — Ambulatory Visit (INDEPENDENT_AMBULATORY_CARE_PROVIDER_SITE_OTHER): Payer: Medicare Other | Admitting: Family Medicine

## 2012-12-04 VITALS — BP 132/80 | HR 73 | Wt 200.0 lb

## 2012-12-04 DIAGNOSIS — E039 Hypothyroidism, unspecified: Secondary | ICD-10-CM

## 2012-12-04 DIAGNOSIS — E785 Hyperlipidemia, unspecified: Secondary | ICD-10-CM

## 2012-12-04 DIAGNOSIS — I10 Essential (primary) hypertension: Secondary | ICD-10-CM

## 2012-12-04 DIAGNOSIS — C184 Malignant neoplasm of transverse colon: Secondary | ICD-10-CM

## 2012-12-04 DIAGNOSIS — R229 Localized swelling, mass and lump, unspecified: Secondary | ICD-10-CM

## 2012-12-04 DIAGNOSIS — N529 Male erectile dysfunction, unspecified: Secondary | ICD-10-CM

## 2012-12-04 LAB — CBC WITH DIFFERENTIAL/PLATELET
Basophils Relative: 0.5 % (ref 0.0–3.0)
Eosinophils Relative: 4.2 % (ref 0.0–5.0)
HCT: 39.9 % (ref 39.0–52.0)
Lymphs Abs: 1.3 10*3/uL (ref 0.7–4.0)
MCHC: 34.4 g/dL (ref 30.0–36.0)
MCV: 95.9 fl (ref 78.0–100.0)
Monocytes Absolute: 0.7 10*3/uL (ref 0.1–1.0)
Platelets: 288 10*3/uL (ref 150.0–400.0)
WBC: 6.7 10*3/uL (ref 4.5–10.5)

## 2012-12-04 LAB — LIPID PANEL
Cholesterol: 181 mg/dL (ref 0–200)
HDL: 62.4 mg/dL (ref >39.00)

## 2012-12-04 LAB — HEPATIC FUNCTION PANEL
ALT: 21 U/L (ref 0–53)
AST: 23 U/L (ref 0–37)
Albumin: 4 g/dL (ref 3.5–5.2)

## 2012-12-04 LAB — BASIC METABOLIC PANEL
Calcium: 9.5 mg/dL (ref 8.4–10.5)
GFR: 71.46 mL/min (ref >60.00)
Glucose, Bld: 90 mg/dL (ref 70–99)
Sodium: 138 mEq/L (ref 135–145)

## 2012-12-04 LAB — TSH: TSH: 50.92 u[IU]/mL — ABNORMAL HIGH (ref 0.35–5.50)

## 2012-12-04 NOTE — Progress Notes (Signed)
Subjective:    Patient ID: Ronald Gutierrez, male    DOB: 1926-07-22, 79 y.o.   MRN: [id number]  HPI Medical followup Patient has history of hypertension, hyperlipidemia, GERD, history of colon cancer, and hypothyroidism Medications reviewed and compliant with all.  He has new issue of nodular growth left facial area anterior to left ear. Noted several months ago. Occasionally sore to touch. No personal history of skin cancer.  Patient has history of Guillain-Barr syndrome and avoids flu vaccine. Denies any recent chest pain. He continues to play golf regularly. No dyspnea. No dizziness. No recent falls. Mood is stable. Hyperlipidemia treated with simvastatin. He is on lisinopril for hypertension. Takes levothyroxin for hypothyroidism.  He's had past history of colon cancer. He has not followed up for repeat colonoscopy.  No recent stool changes. Needs followup CEA levels. He had good appetite and no recent weight loss.  He does complain of some erectile dysfunction. He specifically requests samples of Cialis. Occasional slow stream. No nitroglycerin use.  Past Medical History  Diagnosis Date  . ANEMIA   . FATIGUE 05/13/2009  . GERD 05/25/2008  . HYPERLIPIDEMIA 05/20/2008  . HYPERTENSION 05/20/2008  . KERATOSIS 10/21/2008  . Hypothyroidism   . Diverticulitis   . Colon adenocarcinoma 05/2008    T4, N0  . Guillain-Barre syndrome   . Tubular adenoma of colon 10/2009   Past Surgical History  Procedure Laterality Date  . Appendectomy  1948  . Cervical laminectomy  1968  . Lumbar laminectomy  1970  . Hemorrhoid surgery    . Colectomy  07/2008    transverse  . Omentectomy    . Hernia repair      inguinal  . Cataract extraction      reports that he quit smoking about 44 years ago. His smoking use included Cigarettes. He has a 22.5 pack-year smoking history. He has never used smokeless tobacco. He reports that he drinks alcohol. He reports that he does not use illicit drugs. family history  includes Heart disease in his brother; Lung cancer in his brother; Prostate cancer in his brother; Stroke in his father. Allergies  Allergen Reactions  . Ferrous Sulfate     REACTION: Stomach upset      Review of Systems  Constitutional: Negative for fever, chills, appetite change, fatigue and unexpected weight change.  HENT: Negative for trouble swallowing.   Eyes: Negative for visual disturbance.  Respiratory: Negative for cough, chest tightness and shortness of breath.   Cardiovascular: Negative for chest pain, palpitations and leg swelling.  Gastrointestinal: Negative for nausea, vomiting, abdominal pain and blood in stool.  Endocrine: Negative for polydipsia and polyuria.  Neurological: Negative for dizziness, syncope, weakness, light-headedness and headaches.       Objective:   Physical Exam  Constitutional: He is oriented to person, place, and time. He appears well-developed and well-nourished.  Neck: Neck supple. No thyromegaly present.  Cardiovascular: Normal rate.  Exam reveals no gallop.   Pulmonary/Chest: Effort normal and breath sounds normal. No respiratory distress. He has no wheezes. He has no rales.  Abdominal: Soft. There is no tenderness.  Musculoskeletal: He exhibits edema.  Only trace edema legs bilaterally  Neurological: He is alert and oriented to person, place, and time.  Skin:  Patient has multiple scattered seborrheic keratoses. Left face reveals approximately 6 mm nodule with slightly ulcerative and crusted surface  Psychiatric: He has a normal mood and affect. His behavior is normal.          Assessment &  Plan:  #1 hypertension. Well controlled. Continue lisinopril #2 hyperlipidemia. Recheck lipid and hepatic panel #3 hypothyroidism. Recheck TSH #4 nodule left face. Very likely represents either squamous cell carcinoma or basal cell carcinoma more likely. Dermatology referral #5 history of colon cancer. Repeat CEA levels #6 health maintenance.  Patient not a candidate for flu vaccine with history of Guillain-Barr syndrome #7 erectile dysfunction. We provided samples of Cialis 5 mg once daily as needed

## 2012-12-04 NOTE — Patient Instructions (Signed)
Cialis 5 mg one daily as needed for erectile dysfunction.

## 2012-12-04 NOTE — Progress Notes (Signed)
Pre visit review using our clinic review tool, if applicable. No additional management support is needed unless otherwise documented below in the visit note. 

## 2012-12-05 ENCOUNTER — Other Ambulatory Visit: Payer: Self-pay

## 2012-12-05 ENCOUNTER — Other Ambulatory Visit: Payer: Self-pay | Admitting: Family Medicine

## 2012-12-05 DIAGNOSIS — E039 Hypothyroidism, unspecified: Secondary | ICD-10-CM

## 2012-12-05 LAB — CEA: CEA: 4.4 ng/mL (ref 0.0–5.0)

## 2012-12-05 MED ORDER — LEVOTHYROXINE SODIUM 175 MCG PO TABS: 175.0000 ug | ORAL_TABLET | Freq: Every day | ORAL | Status: DC

## 2012-12-24 ENCOUNTER — Other Ambulatory Visit: Payer: Self-pay | Admitting: Dermatology

## 2013-01-15 ENCOUNTER — Encounter: Payer: Self-pay | Admitting: Family Medicine

## 2013-01-15 ENCOUNTER — Ambulatory Visit (INDEPENDENT_AMBULATORY_CARE_PROVIDER_SITE_OTHER): Payer: Medicare Other | Admitting: Family Medicine

## 2013-01-15 VITALS — BP 98/60 | HR 69 | Temp 98.6°F | Wt 194.0 lb

## 2013-01-15 DIAGNOSIS — J111 Influenza due to unidentified influenza virus with other respiratory manifestations: Secondary | ICD-10-CM

## 2013-01-15 DIAGNOSIS — R05 Cough: Secondary | ICD-10-CM

## 2013-01-15 DIAGNOSIS — R059 Cough, unspecified: Secondary | ICD-10-CM

## 2013-01-15 MED ORDER — DOXYCYCLINE HYCLATE 100 MG PO TABS: 100.0000 mg | ORAL_TABLET | Freq: Two times a day (BID) | ORAL | Status: DC

## 2013-01-15 NOTE — Progress Notes (Signed)
Pre visit review using our clinic review tool, if applicable. No additional management support is needed unless otherwise documented below in the visit note. 

## 2013-01-15 NOTE — Progress Notes (Signed)
Chief Complaint  Patient presents with  . Cough    congestion, SOB, fatigue after having the flu     HPI: Ronald Gutierrez is an 79 yo M pt of Dr. Caryl Never with PMH listed below here for an acute visit for cough: -symptoms: started 1 week ago, had the flu, fevers resolved after 3 days but cough, fatigue and mild SOB have ersisted -denies:current fever,  NVD, tooth pain, sinus pain -has tried: OTC cough medication -sick contacts/travel/risks: denies Ebola risks, wife with confirmed flu per his report and he had similar symptoms 1 week ago  ROS: See pertinent positives and negatives per HPI.  Past Medical History  Diagnosis Date  . ANEMIA   . FATIGUE 05/13/2009  . GERD 05/25/2008  . HYPERLIPIDEMIA 05/20/2008  . HYPERTENSION 05/20/2008  . KERATOSIS 10/21/2008  . Hypothyroidism   . Diverticulitis   . Colon adenocarcinoma 05/2008    T4, N0  . Guillain-Barre syndrome   . Tubular adenoma of colon 10/2009    Past Surgical History  Procedure Laterality Date  . Appendectomy  1948  . Cervical laminectomy  1968  . Lumbar laminectomy  1970  . Hemorrhoid surgery    . Colectomy  07/2008    transverse  . Omentectomy    . Hernia repair      inguinal  . Cataract extraction      Family History  Problem Relation Age of Onset  . Lung cancer Brother   . Heart disease Brother   . Prostate cancer Brother   . Stroke Father     History   Social History  . Marital Status: Married    Spouse Name: N/A    Number of Children: N/A  . Years of Education: N/A   Occupational History  . Retired    Social History Main Topics  . Smoking status: Former Smoker -- 1.50 packs/day for 15 years    Types: Cigarettes    Quit date: 01/17/1968  . Smokeless tobacco: Never Used  . Alcohol Use: Yes  . Drug Use: No  . Sexual Activity: None   Other Topics Concern  . None   Social History Narrative  . None    Current outpatient prescriptions:aspirin 81 MG tablet, Take 81 mg by mouth daily., Disp: , Rfl:  ;  levothyroxine (SYNTHROID, LEVOTHROID) 150 MCG tablet, TAKE 1 TABLET ONCE DAILY., Disp: 90 tablet, Rfl: 3;  levothyroxine (SYNTHROID, LEVOTHROID) 175 MCG tablet, Take 1 tablet (175 mcg total) by mouth daily before breakfast., Disp: 90 tablet, Rfl: 3;  lisinopril (PRINIVIL,ZESTRIL) 40 MG tablet, TAKE 1 TABLET ONCE DAILY., Disp: 90 tablet, Rfl: 3 loratadine (CLARITIN) 10 MG tablet, Take 10 mg by mouth daily.  , Disp: , Rfl: ;  Multiple Vitamins-Minerals (CENTRUM ULTRA MENS PO), Take 1 tablet by mouth daily.  , Disp: , Rfl: ;  omeprazole (PRILOSEC) 20 MG capsule, TAKE (1) CAPSULE DAILY., Disp: 90 capsule, Rfl: 2;  simvastatin (ZOCOR) 40 MG tablet, TAKE (1/2) TABLET DAILY., Disp: 45 tablet, Rfl: 3 doxycycline (VIBRA-TABS) 100 MG tablet, Take 1 tablet (100 mg total) by mouth 2 (two) times daily., Disp: 20 tablet, Rfl: 0  EXAM:  Filed Vitals:   01/15/13 0858  BP: 98/60  Pulse: 69  Temp: 98.6 F (37 C)    Body mass index is 28.64 kg/(m^2).  GENERAL: vitals reviewed and listed above, alert, oriented, appears well hydrated and in no acute distress  HEENT: atraumatic, conjunttiva clear, no obvious abnormalities on inspection of external nose and ears, normal  appearance of ear canals and TMs, clear nasal congestion, mild post oropharyngeal erythema with PND, no tonsillar edema or exudate, no sinus TTP  NECK: no obvious masses on inspection  LUNGS: clear to auscultation bilaterally, no wheezes, rales or rhonchi, good air movement  CV: HRRR, no peripheral edema  MS: moves all extremities without noticeable abnormality  PSYCH: pleasant and cooperative, no obvious depression or anxiety  ASSESSMENT AND PLAN:  Discussed the following assessment and plan:  Influenza-like illness - Plan: doxycycline (VIBRA-TABS) 100 MG tablet  Cough - Plan: doxycycline (VIBRA-TABS) 100 MG tablet  -given HPI and exam findings today, a serious infection or illness is unlikely. We discussed potential etiologies -  likely resolving influenza with possible resp involement. Lung exam and O2 on RA normal today, but given symptoms and age will tx with abx in case developing PNA. He is out of treatment window for antivirals. -BP a little low, but appears from ROC BP fluctuates and is this low on occ - he feels ok and is not having any bleeding and is tolerating fluids well.  -We discussed treatment side effects, likely course, transmission, and signs of developing a serious illness. -of course, we advised to return or notify a doctor immediately if symptoms worsen or persist or new concerns arise.    There are no Patient Instructions on file for this visit.   Kriste Basque R.

## 2013-03-19 ENCOUNTER — Other Ambulatory Visit (INDEPENDENT_AMBULATORY_CARE_PROVIDER_SITE_OTHER): Payer: Medicare Other

## 2013-03-19 DIAGNOSIS — E039 Hypothyroidism, unspecified: Secondary | ICD-10-CM

## 2013-03-19 LAB — TSH: TSH: 69.61 u[IU]/mL — AB (ref 0.35–5.50)

## 2013-03-24 ENCOUNTER — Other Ambulatory Visit: Payer: Self-pay | Admitting: Family Medicine

## 2013-03-26 ENCOUNTER — Other Ambulatory Visit: Payer: Self-pay | Admitting: Family Medicine

## 2013-03-26 DIAGNOSIS — E039 Hypothyroidism, unspecified: Secondary | ICD-10-CM

## 2013-04-02 ENCOUNTER — Encounter: Payer: Self-pay | Admitting: *Deleted

## 2013-04-16 ENCOUNTER — Other Ambulatory Visit: Payer: Self-pay

## 2013-04-16 MED ORDER — SIMVASTATIN 40 MG PO TABS: ORAL_TABLET | ORAL | Status: DC

## 2013-04-22 ENCOUNTER — Encounter: Payer: Self-pay | Admitting: Internal Medicine

## 2013-04-22 ENCOUNTER — Ambulatory Visit (INDEPENDENT_AMBULATORY_CARE_PROVIDER_SITE_OTHER): Payer: Medicare Other | Admitting: Internal Medicine

## 2013-04-22 VITALS — BP 116/68 | HR 61 | Temp 97.7°F | Ht 69.0 in | Wt 197.0 lb

## 2013-04-22 DIAGNOSIS — E039 Hypothyroidism, unspecified: Secondary | ICD-10-CM

## 2013-04-22 LAB — T4, FREE: Free T4: 1.52 ng/dL (ref 0.60–1.60)

## 2013-04-22 LAB — TSH: TSH: 9.19 u[IU]/mL — AB (ref 0.35–5.50)

## 2013-04-22 MED ORDER — LEVOTHYROXINE SODIUM 150 MCG PO TABS: 150.0000 ug | ORAL_TABLET | Freq: Every day | ORAL | Status: DC

## 2013-04-22 NOTE — Patient Instructions (Signed)
Stop the Levothyroxine 175 mcg and restart the 150 mcg. Please stop at the lab. Please return for labs in ~5 weeks.  Please come back for a follow-up appointment in 4 months.

## 2013-04-22 NOTE — Progress Notes (Signed)
Patient ID: Ronald Gutierrez, male   DOB: November 29, 1926, 80 y.o.   MRN: [id number]   HPI  Ronald Gutierrez is a 80 y.o.-year-old male, referred by his PCP, Dr. Elease Hashimoto, for management of hypothyroidism.  Pt. has been dx with hypothyroidism in 2010; is on Levothyroxine 175 mcg (increased 11/2013).  He takes the Levothyroxine: - fasting - with OJ!!! - separated by 30-60 min from b'fast  - takes it along with his other meds including PPIs, multivitamins!!!  I reviewed pt's thyroid tests: Lab Results  Component Value Date   TSH 69.61* 03/19/2013   TSH 50.92* 12/04/2012   TSH 1.09 09/20/2011   TSH 2.01 10/19/2010   TSH 6.45* 04/06/2010   TSH 11.34* 12/15/2009   TSH 18.87* 08/25/2009   TSH 7.30* 05/13/2009   TSH 33.53* 10/14/2008   TSH 5.99* 05/20/2008    Pt denies feeling nodules in neck, hoarseness, dysphagia/odynophagia, SOB with lying down.  Pt denies - cold intolerance - weight gain - fatigue - constipation - dry skin - hair falling - depression  She has + FH of thyroid disorders in: daughter (79 y/o). No FH of thyroid cancer.  No h/o radiation tx to head or neck.  No recent use of iodine supplements.  I reviewed his chart and he also has a history of colon cancer (s/p colectomy in 2010).  ROS: Constitutional: no weight gain/loss, no fatigue, no subjective hyperthermia/hypothermia Eyes: no blurry vision, no xerophthalmia ENT: no sore throat, no nodules palpated in throat, no dysphagia/odynophagia, no hoarseness Cardiovascular: no CP/SOB/palpitations/leg swelling Respiratory: no cough/SOB Gastrointestinal: no N/V/+ occasional D/no C Musculoskeletal: no muscle/joint aches Skin: no rashes Neurological: no tremors/numbness/tingling/dizziness Psychiatric: no depression/anxiety  Past Medical History  Diagnosis Date  . ANEMIA   . FATIGUE 05/13/2009  . GERD 05/25/2008  . HYPERLIPIDEMIA 05/20/2008  . HYPERTENSION 05/20/2008  . KERATOSIS 10/21/2008  . Hypothyroidism   . Diverticulitis   .  Colon adenocarcinoma 05/2008    T4, N0  . Guillain-Barre syndrome   . Tubular adenoma of colon 10/2009   Past Surgical History  Procedure Laterality Date  . Appendectomy  1948  . Cervical laminectomy  1968  . Lumbar laminectomy  1970  . Hemorrhoid surgery    . Colectomy  07/2008    transverse  . Omentectomy    . Hernia repair      inguinal  . Cataract extraction     History   Social History  . Marital Status: Married    Spouse Name: N/A    Number of Children: 3   Occupational History  . Retired    Social History Main Topics  . Smoking status: Former Smoker -- 1.50 packs/day for 15 years    Types: Cigarettes    Quit date: 01/17/1968  . Smokeless tobacco: Never Used  . Alcohol Use: Yes, whiskey qod - 2 drinks  . Drug Use: No   Current Outpatient Prescriptions on File Prior to Visit  Medication Sig Dispense Refill  . aspirin 81 MG tablet Take 81 mg by mouth daily.      Marland Kitchen doxycycline (VIBRA-TABS) 100 MG tablet Take 1 tablet (100 mg total) by mouth 2 (two) times daily.  20 tablet  0  . lisinopril (PRINIVIL,ZESTRIL) 40 MG tablet TAKE 1 TABLET ONCE DAILY.  90 tablet  3  . loratadine (CLARITIN) 10 MG tablet Take 10 mg by mouth daily.        . Multiple Vitamins-Minerals (CENTRUM ULTRA MENS PO) Take 1 tablet by mouth daily.        Marland Kitchen  omeprazole (PRILOSEC) 20 MG capsule TAKE (1) CAPSULE DAILY.  90 capsule  3  . simvastatin (ZOCOR) 40 MG tablet TAKE (1/2) TABLET DAILY.  45 tablet  3   No current facility-administered medications on file prior to visit.   Allergies  Allergen Reactions  . Ferrous Sulfate     REACTION: Stomach upset   Family History  Problem Relation Age of Onset  . Lung cancer Brother   . Heart disease Brother   . Prostate cancer Brother   . Stroke Father    PE: BP 116/68  Pulse 61  Temp(Src) 97.7 F (36.5 C) (Oral)  Ht 5\' 9"  (1.753 m)  Wt 197 lb (89.359 kg)  BMI 29.08 kg/m2  SpO2 97% Wt Readings from Last 3 Encounters:  04/22/13 197 lb (89.359  kg)  01/15/13 194 lb (87.998 kg)  12/04/12 200 lb (90.719 kg)   Constitutional: slightly overweight, in NAD Eyes: PERRLA, EOMI, no exophthalmos ENT: moist mucous membranes, no thyromegaly, no cervical lymphadenopathy Cardiovascular: RRR, No MRG Respiratory: CTA B Gastrointestinal: abdomen soft, NT, ND, BS+ Musculoskeletal: no deformities, strength intact in all 4 Skin: moist, warm, no rashes Neurological: no tremor with outstretched hands, DTR undet. in all 4 (h/o Guillain-Barre)  ASSESSMENT: 1. Hypothyroidism - uncontrolled  PLAN:  1. Patient with 5 year h/o hypothyroidism, on levothyroxine therapy, with fluctuating TSH levels, recently 69. However, he appears euthyroid. - We discussed about correct intake of levothyroxine, fasting, with water, separated by at least 30 minutes from breakfast, and separated by more than 4 hours from calcium, iron, multivitamins, acid reflux medications (PPIs). He is taking the Synthroid along with OJ, PPI and MVI, which is likely causing the LT4 malabsorption. - He does not appear to have a goiter, thyroid nodules, or neck compression symptoms - I advised him to switch to 150 mcg LT4 - we'll check thyroid tests today: TSH, free T4 - If these are abnormal, he will need to return in 5 weeks for repeat labs - I will see him back in 4 mo  Office Visit on 04/22/2013  Component Date Value Ref Range Status  . TSH 04/22/2013 9.19* 0.35 - 5.50 uIU/mL Final  . Free T4 04/22/2013 1.52  0.60 - 1.60 ng/dL Final   TSH improved >> continue with above plan.

## 2013-06-04 ENCOUNTER — Ambulatory Visit (INDEPENDENT_AMBULATORY_CARE_PROVIDER_SITE_OTHER): Payer: Medicare HMO | Admitting: Family Medicine

## 2013-06-04 ENCOUNTER — Encounter: Payer: Self-pay | Admitting: Family Medicine

## 2013-06-04 VITALS — BP 132/72 | HR 60 | Wt 196.0 lb

## 2013-06-04 DIAGNOSIS — E785 Hyperlipidemia, unspecified: Secondary | ICD-10-CM

## 2013-06-04 DIAGNOSIS — I10 Essential (primary) hypertension: Secondary | ICD-10-CM

## 2013-06-04 DIAGNOSIS — K219 Gastro-esophageal reflux disease without esophagitis: Secondary | ICD-10-CM

## 2013-06-04 DIAGNOSIS — E039 Hypothyroidism, unspecified: Secondary | ICD-10-CM

## 2013-06-04 NOTE — Progress Notes (Signed)
Pre visit review using our clinic review tool, if applicable. No additional management support is needed unless otherwise documented below in the visit note. 

## 2013-06-04 NOTE — Progress Notes (Signed)
   Subjective:    Patient ID: Ronald Gutierrez, male    DOB: [birth year], 80 y.o.   MRN: [id number]  HPI Medical Followup. Chronic problems include history of colon cancer, hyperlipidemia, hypertension, GERD, hypothyroidism. Recent elevated TSH and patient compliant with medication. There apparently may have been some absorption issues. He was taking with some other medications and also with things like hours juice that may have impaired his absorption. He is seen endocrinologist and adjustments have been made. He has followup labs with them. He does not have any symptoms of overt hypothyroidism  Medications reviewed. Compliant with all. Blood pressure stable. No dizziness. No chest pains. Takes simvastatin for hyperlipidemia. No history of CAD. Golfs 3 times per week. GERD symptoms are controlled with omeprazole. No dysphagia  Past Medical History  Diagnosis Date  . ANEMIA   . FATIGUE 05/13/2009  . GERD 05/25/2008  . HYPERLIPIDEMIA 05/20/2008  . HYPERTENSION 05/20/2008  . KERATOSIS 10/21/2008  . Hypothyroidism   . Diverticulitis   . Colon adenocarcinoma 05/2008    T4, N0  . Guillain-Barre syndrome   . Tubular adenoma of colon 10/2009   Past Surgical History  Procedure Laterality Date  . Appendectomy  1948  . Cervical laminectomy  1968  . Lumbar laminectomy  1970  . Hemorrhoid surgery    . Colectomy  07/2008    transverse  . Omentectomy    . Hernia repair      inguinal  . Cataract extraction      reports that he quit smoking about 45 years ago. His smoking use included Cigarettes. He has a 22.5 pack-year smoking history. He has never used smokeless tobacco. He reports that he drinks alcohol. He reports that he does not use illicit drugs. family history includes Heart disease in his brother; Lung cancer in his brother; Prostate cancer in his brother; Stroke in his father. Allergies  Allergen Reactions  . Ferrous Sulfate     REACTION: Stomach upset      Review of Systems    Constitutional: Negative for appetite change, fatigue and unexpected weight change.  Eyes: Negative for visual disturbance.  Respiratory: Negative for cough, chest tightness and shortness of breath.   Cardiovascular: Negative for chest pain, palpitations and leg swelling.  Endocrine: Negative for cold intolerance, polydipsia and polyuria.  Neurological: Negative for dizziness, syncope, weakness, light-headedness and headaches.       Objective:   Physical Exam  Constitutional: He is oriented to person, place, and time. He appears well-developed and well-nourished.  HENT:  Right Ear: External ear normal.  Left Ear: External ear normal.  Mouth/Throat: Oropharynx is clear and moist.  Eyes: Pupils are equal, round, and reactive to light.  Neck: Neck supple. No thyromegaly present.  Cardiovascular: Normal rate and regular rhythm.   Pulmonary/Chest: Effort normal and breath sounds normal. No respiratory distress. He has no wheezes. He has no rales.  Abdominal:  Patient's ventral hernia which is soft and nontender  Musculoskeletal: He exhibits no edema.  Neurological: He is alert and oriented to person, place, and time.          Assessment & Plan:  #1 hypertension. Well controlled. Continue lisinopril #2 hyperlipidemia. Continue simvastatin. Recheck fasting lipids at followup in 6 months #3 GERD which is well controlled with omeprazole #4 hypothyroidism. Recent elevated TSH possibly reflecting poor absorption. Modifications have been made. Patient has followup with endocrinology

## 2013-06-25 ENCOUNTER — Other Ambulatory Visit: Payer: Self-pay | Admitting: *Deleted

## 2013-06-25 MED ORDER — LEVOTHYROXINE SODIUM 150 MCG PO TABS: 150.0000 ug | ORAL_TABLET | Freq: Every day | ORAL | Status: DC

## 2013-06-25 NOTE — Telephone Encounter (Signed)
Pt requested 90 day refill, he is going out of town until Aug. 20th. Done.

## 2013-09-16 ENCOUNTER — Encounter: Payer: Self-pay | Admitting: Internal Medicine

## 2013-09-16 ENCOUNTER — Ambulatory Visit (INDEPENDENT_AMBULATORY_CARE_PROVIDER_SITE_OTHER): Payer: Medicare HMO | Admitting: Internal Medicine

## 2013-09-16 ENCOUNTER — Other Ambulatory Visit: Payer: Self-pay | Admitting: Internal Medicine

## 2013-09-16 VITALS — BP 118/66 | HR 66 | Temp 97.7°F | Resp 12 | Wt 193.0 lb

## 2013-09-16 DIAGNOSIS — E039 Hypothyroidism, unspecified: Secondary | ICD-10-CM

## 2013-09-16 NOTE — Progress Notes (Signed)
Patient ID: Ronald Gutierrez, male   DOB: 08-11-1926, 80 y.o.   MRN: [id number]   HPI  Ronald Gutierrez is a 80 y.o.-year-old male, returning for f/u for hypothyroidism, dx 2010. Last visit 5 mo ago.  Pt. was on Levothyroxine (LT4) 175 mcg (increased 11/2013). Before last visit, his TSH levels have been very high, as he was taking his LT4 along with OJ and MVI, PPI. We separated these at last visit, and pt was instructed how to take his LT4 correctly. I also advised him to decrease the dose of LT4 to 150 mcg as we expected better absorption after the above changes.  He now takes the Levothyroxine 150 mcg: - fasting - with water - separated by 30-60 min from b'fast  - separated by only 30-60 min by his other meds including PPIs, multivitamins! (despite advice to separate them by >4h)  I reviewed pt's thyroid tests: Lab Results  Component Value Date   TSH 9.19* 04/22/2013   TSH 69.61* 03/19/2013   TSH 50.92* 12/04/2012   TSH 1.09 09/20/2011   TSH 2.01 10/19/2010   TSH 6.45* 04/06/2010   TSH 11.34* 12/15/2009   TSH 18.87* 08/25/2009   TSH 7.30* 05/13/2009   TSH 33.53* 10/14/2008   FREET4 1.52 04/22/2013    Pt denies feeling nodules in neck, hoarseness, dysphagia/odynophagia, SOB with lying down.  Pt denies - cold intolerance - weight gain - fatigue - constipation - dry skin - hair falling - depression  He overall feels much better than before.  He also has a history of colon cancer (s/p colectomy in 2010), HL, HTN, GERD.  ROS: Constitutional: no weight gain/loss, no fatigue, no subjective hyperthermia/hypothermia Eyes: no blurry vision, no xerophthalmia ENT: no sore throat, no nodules palpated in throat, no dysphagia/odynophagia, no hoarseness Cardiovascular: no CP/SOB/palpitations/leg swelling Respiratory: no cough/SOB Gastrointestinal: no N/V/D/C Musculoskeletal: no muscle/joint aches Skin: no rashes Neurological: no tremors/numbness/tingling/dizziness  I reviewed pt's medications,  allergies, PMH, social hx, family hx and no changes required, except as mentioned above.  PE: BP 118/66  Pulse 66  Temp(Src) 97.7 F (36.5 C) (Oral)  Resp 12  Wt 193 lb (87.544 kg)  SpO2 98% Wt Readings from Last 3 Encounters:  09/16/13 193 lb (87.544 kg)  06/04/13 196 lb (88.905 kg)  04/22/13 197 lb (89.359 kg)   Constitutional: slightly overweight, in NAD Eyes: PERRLA, EOMI, no exophthalmos ENT: moist mucous membranes, no thyromegaly, no cervical lymphadenopathy Cardiovascular: RRR, No MRG Respiratory: CTA B Gastrointestinal: abdomen soft, NT, ND, BS+ Musculoskeletal: no deformities, strength intact in all 4 Skin: moist, warm, no rashes Neurological: no tremor with outstretched hands, DTR undet. in all 4 (h/o Guillain-Barre)  ASSESSMENT: 1. Hypothyroidism - uncontrolled  PLAN:  1. Patient with 5 year h/o hypothyroidism, on levothyroxine therapy, with fluctuating TSH levels, mostly high, due to lack of absorption of his LT4. - At last visit, we discussed about correct intake of levothyroxine, fasting, with water, separated by at least 30 minutes from breakfast, and separated by more than 4 hours from calcium, iron, multivitamins, acid reflux medications (PPIs). He is now taking the Synthroid separated from his MVI and PPI, but not by enough time (only 30-60 min). I advised him to move them later in the day and he will take them at night. - He does not appear to have a goiter, thyroid nodules, or neck compression symptoms. He appears euthyroid. - continue 150 mcg LT4 for now - will check thyroid tests today: TSH, free T4 >> to see  if we need to decrease the LT4 further - he will need to return in 5 weeks for repeat labs - I will see him back in 6 mo  Orders Only on 09/16/2013  Component Date Value Ref Range Status  . TSH 09/16/2013 0.009* 0.350 - 4.500 uIU/mL Final  . Free T4 09/16/2013 1.85* 0.80 - 1.80 ng/dL Final   TFTs abnormal >> need to decrease the LT4 to 125 mcg  daily. Will need repeat labs in 5 weeks as discussed.

## 2013-09-16 NOTE — Patient Instructions (Signed)
Please move the multivitamins and the Protonix at dinnertime/bedtime. Please come back in 6 weeks for labs. Please come back for a follow-up appointment in 6 months

## 2013-09-17 LAB — T4, FREE: Free T4: 1.85 ng/dL — ABNORMAL HIGH (ref 0.80–1.80)

## 2013-09-17 LAB — TSH: TSH: 0.009 u[IU]/mL — ABNORMAL LOW (ref 0.350–4.500)

## 2013-09-17 MED ORDER — LEVOTHYROXINE SODIUM 125 MCG PO TABS: 125.0000 ug | ORAL_TABLET | Freq: Every day | ORAL | Status: DC

## 2013-10-04 ENCOUNTER — Other Ambulatory Visit: Payer: Self-pay | Admitting: Family Medicine

## 2013-10-15 ENCOUNTER — Encounter: Payer: Self-pay | Admitting: Gastroenterology

## 2013-10-28 ENCOUNTER — Other Ambulatory Visit: Payer: Self-pay | Admitting: Internal Medicine

## 2013-10-28 ENCOUNTER — Other Ambulatory Visit (INDEPENDENT_AMBULATORY_CARE_PROVIDER_SITE_OTHER): Payer: Medicare HMO

## 2013-10-28 DIAGNOSIS — E039 Hypothyroidism, unspecified: Secondary | ICD-10-CM

## 2013-10-28 LAB — T4, FREE: Free T4: 1.13 ng/dL (ref 0.60–1.60)

## 2013-10-28 LAB — TSH: TSH: 8.85 u[IU]/mL — ABNORMAL HIGH (ref 0.35–4.50)

## 2013-12-03 ENCOUNTER — Encounter: Payer: Self-pay | Admitting: Family Medicine

## 2013-12-03 ENCOUNTER — Ambulatory Visit (INDEPENDENT_AMBULATORY_CARE_PROVIDER_SITE_OTHER): Payer: Medicare HMO | Admitting: Family Medicine

## 2013-12-03 VITALS — BP 132/78 | HR 62 | Temp 98.0°F | Wt 196.0 lb

## 2013-12-03 DIAGNOSIS — C184 Malignant neoplasm of transverse colon: Secondary | ICD-10-CM

## 2013-12-03 DIAGNOSIS — E785 Hyperlipidemia, unspecified: Secondary | ICD-10-CM

## 2013-12-03 DIAGNOSIS — Z8669 Personal history of other diseases of the nervous system and sense organs: Secondary | ICD-10-CM

## 2013-12-03 DIAGNOSIS — I10 Essential (primary) hypertension: Secondary | ICD-10-CM

## 2013-12-03 LAB — CBC WITH DIFFERENTIAL/PLATELET
BASOS PCT: 0.6 % (ref 0.0–3.0)
Basophils Absolute: 0 10*3/uL (ref 0.0–0.1)
Eosinophils Absolute: 0.3 10*3/uL (ref 0.0–0.7)
Eosinophils Relative: 4.3 % (ref 0.0–5.0)
HCT: 38 % — ABNORMAL LOW (ref 39.0–52.0)
Hemoglobin: 12.8 g/dL — ABNORMAL LOW (ref 13.0–17.0)
LYMPHS PCT: 24 % (ref 12.0–46.0)
Lymphs Abs: 1.6 10*3/uL (ref 0.7–4.0)
MCHC: 33.7 g/dL (ref 30.0–36.0)
MCV: 98.9 fl (ref 78.0–100.0)
Monocytes Absolute: 0.7 10*3/uL (ref 0.1–1.0)
Monocytes Relative: 10.4 % (ref 3.0–12.0)
Neutro Abs: 4.1 10*3/uL (ref 1.4–7.7)
Neutrophils Relative %: 60.7 % (ref 43.0–77.0)
Platelets: 271 10*3/uL (ref 150.0–400.0)
RBC: 3.84 Mil/uL — AB (ref 4.22–5.81)
RDW: 13.7 % (ref 11.5–15.5)
WBC: 6.8 10*3/uL (ref 4.0–10.5)

## 2013-12-03 LAB — LIPID PANEL
Cholesterol: 164 mg/dL (ref 0–200)
HDL: 68 mg/dL (ref >39.00)
LDL CALC: 76 mg/dL (ref 0–99)
NonHDL: 96
TRIGLYCERIDES: 100 mg/dL (ref 0.0–149.0)
Total CHOL/HDL Ratio: 2
VLDL: 20 mg/dL (ref 0.0–40.0)

## 2013-12-03 LAB — HEPATIC FUNCTION PANEL
ALBUMIN: 3.8 g/dL (ref 3.5–5.2)
ALT: 18 U/L (ref 0–53)
AST: 19 U/L (ref 0–37)
Alkaline Phosphatase: 78 U/L (ref 39–117)
BILIRUBIN TOTAL: 0.8 mg/dL (ref 0.2–1.2)
Bilirubin, Direct: 0.1 mg/dL (ref 0.0–0.3)
Total Protein: 7.2 g/dL (ref 6.0–8.3)

## 2013-12-03 LAB — BASIC METABOLIC PANEL
BUN: 19 mg/dL (ref 6–23)
CHLORIDE: 106 meq/L (ref 96–112)
CO2: 27 mEq/L (ref 19–32)
Calcium: 9 mg/dL (ref 8.4–10.5)
Creatinine, Ser: 1.1 mg/dL (ref 0.4–1.5)
GFR: 70.51 mL/min (ref >60.00)
Glucose, Bld: 86 mg/dL (ref 70–99)
POTASSIUM: 4.5 meq/L (ref 3.5–5.1)
Sodium: 139 mEq/L (ref 135–145)

## 2013-12-03 NOTE — Progress Notes (Signed)
   Subjective:    Patient ID: Ronald Gutierrez, male    DOB: [birth year], 80 y.o.   MRN: [id number]  HPI   Patient seen for routine medical follow-up. His chronic problems include history of colon cancer, hyperlipidemia, hypertension, GERD, hypothyroidism. Generally doing well. Still plays golf several times per week. Medications reviewed. Compliant with all. Thyroid being followed by endocrinology. Recent TSH was elevated. He states he is taking his medication exactly the right way and not taking with antacids or other things that would impact absorption.  No dizziness. No chest pains. Denies any dysphagia or active GERD symptoms. He cannot take flu vaccine because of history of Guillain-Barr syndrome.  Past Medical History  Diagnosis Date  . ANEMIA   . FATIGUE 05/13/2009  . GERD 05/25/2008  . HYPERLIPIDEMIA 05/20/2008  . HYPERTENSION 05/20/2008  . KERATOSIS 10/21/2008  . Hypothyroidism   . Diverticulitis   . Colon adenocarcinoma 05/2008    T4, N0  . Guillain-Barre syndrome   . Tubular adenoma of colon 10/2009   Past Surgical History  Procedure Laterality Date  . Appendectomy  1948  . Cervical laminectomy  1968  . Lumbar laminectomy  1970  . Hemorrhoid surgery    . Colectomy  07/2008    transverse  . Omentectomy    . Hernia repair      inguinal  . Cataract extraction      reports that he quit smoking about 45 years ago. His smoking use included Cigarettes. He has a 22.5 pack-year smoking history. He has never used smokeless tobacco. He reports that he drinks alcohol. He reports that he does not use illicit drugs. family history includes Heart disease in his brother; Lung cancer in his brother; Prostate cancer in his brother; Stroke in his father. Allergies  Allergen Reactions  . Ferrous Sulfate     REACTION: Stomach upset      Review of Systems  Constitutional: Negative for fatigue and unexpected weight change.  Eyes: Negative for visual disturbance.  Respiratory: Negative for  cough, chest tightness and shortness of breath.   Cardiovascular: Negative for chest pain, palpitations and leg swelling.  Gastrointestinal: Negative for abdominal pain, diarrhea and constipation.  Neurological: Negative for dizziness, syncope, weakness, light-headedness and headaches.       Objective:   Physical Exam  Constitutional: He is oriented to person, place, and time. He appears well-developed and well-nourished.  HENT:  Right Ear: External ear normal.  Left Ear: External ear normal.  Mouth/Throat: Oropharynx is clear and moist.  Eyes: Pupils are equal, round, and reactive to light.  Neck: Neck supple. No thyromegaly present.  Cardiovascular: Normal rate and regular rhythm.   Pulmonary/Chest: Effort normal and breath sounds normal. No respiratory distress. He has no wheezes. He has no rales.  Abdominal:  Large ventral hernia which is soft and nontender  Musculoskeletal: He exhibits no edema.  Neurological: He is alert and oriented to person, place, and time.          Assessment & Plan:  #1 hypertension. Stable and at goal. Continue lisinopril. Check basic metabolic panel #2 history of hyperlipidemia. Check lipid and hepatic panel. Continue simvastatin #3 history of colon cancer. Reassess CEA. #4 GERD symptomatically stable. Continue omeprazole #5 hypothyroidism. Followed by endocrinology #6 health maintenance. Patient cannot take flu vaccine or pneumonia vaccine with history of Gullain- Barre' Syndrome.

## 2013-12-03 NOTE — Progress Notes (Signed)
Pre visit review using our clinic review tool, if applicable. No additional management support is needed unless otherwise documented below in the visit note. 

## 2013-12-04 LAB — CEA: CEA: 4.4 ng/mL (ref 0.0–5.0)

## 2014-01-01 ENCOUNTER — Telehealth: Payer: Self-pay

## 2014-01-01 MED ORDER — LISINOPRIL 40 MG PO TABS: 40.0000 mg | ORAL_TABLET | Freq: Every day | ORAL | Status: DC

## 2014-01-01 NOTE — Telephone Encounter (Signed)
Rx sent to pharmacy   

## 2014-01-01 NOTE — Telephone Encounter (Signed)
Rx request for Lisinopril 40 mg tablet- Take 1 tablet daily #90  Pharm:  [facility].

## 2014-01-13 ENCOUNTER — Other Ambulatory Visit: Payer: Self-pay | Admitting: *Deleted

## 2014-01-13 MED ORDER — LEVOTHYROXINE SODIUM 125 MCG PO TABS: 125.0000 ug | ORAL_TABLET | Freq: Every day | ORAL | Status: DC

## 2014-02-24 ENCOUNTER — Telehealth: Payer: Self-pay | Admitting: Family Medicine

## 2014-02-24 NOTE — Telephone Encounter (Signed)
Lars Mage, he needs a flu shot.  Can you call him to see if he has gotten it?  Thank you!

## 2014-02-24 NOTE — Telephone Encounter (Signed)
Called and spoke with Guillan-Barre syndrome and cannot have a flu vaccine.

## 2014-02-24 NOTE — Telephone Encounter (Signed)
Pt said Jenny Reichmann called him and he was returning your call

## 2014-03-17 ENCOUNTER — Ambulatory Visit (INDEPENDENT_AMBULATORY_CARE_PROVIDER_SITE_OTHER): Payer: Medicare HMO | Admitting: Internal Medicine

## 2014-03-17 ENCOUNTER — Encounter: Payer: Self-pay | Admitting: Internal Medicine

## 2014-03-17 ENCOUNTER — Other Ambulatory Visit: Payer: Self-pay | Admitting: Internal Medicine

## 2014-03-17 VITALS — BP 134/82 | HR 73 | Temp 97.8°F | Resp 12 | Wt 196.8 lb

## 2014-03-17 DIAGNOSIS — E039 Hypothyroidism, unspecified: Secondary | ICD-10-CM

## 2014-03-17 LAB — T4, FREE: FREE T4: 1.32 ng/dL (ref 0.60–1.60)

## 2014-03-17 LAB — TSH: TSH: 7.13 u[IU]/mL — AB (ref 0.35–4.50)

## 2014-03-17 MED ORDER — LEVOTHYROXINE SODIUM 137 MCG PO TABS: 137.0000 ug | ORAL_TABLET | Freq: Every day | ORAL | Status: DC

## 2014-03-17 NOTE — Patient Instructions (Addendum)
Please stop at the lab.  Please continue Levothyroxine 125 mcg daily.  Take the thyroid hormone every day, with water, >30 minutes before breakfast, separated by >4 hours from acid reflux medications, calcium, iron, multivitamins.  Please come back for a follow-up appointment in 6 months

## 2014-03-17 NOTE — Progress Notes (Signed)
Patient ID: Ronald Gutierrez, male   DOB: [birth year], 81 y.o.   MRN: [id number]   HPI  Ronald Gutierrez is a 81 y.o.-year-old male, returning for f/u for hypothyroidism, dx 2010. Last visit 5 mo ago.  Before last visit, his TSH levels have been very high, as he was taking his LT4 along with OJ and MVI, PPI. We separated these >> TSH became very suppressed. At that point, we decreased the dose of levothyroxine to 125 g daily. The next TSH was higher than normal, however we continue the same dose of LT4 pending lab results in 5 weeks, however he did not return for these.  He now takes the Levothyroxine 125 mcg: - fasting - with water - separated by 30-60 min from b'fast  - separated by only PPIs, multivitamins at 2 pm  I reviewed pt's thyroid tests: Lab Results  Component Value Date   TSH 8.85* 10/28/2013   TSH 0.009* 09/16/2013   TSH 9.19* 04/22/2013   TSH 69.61* 03/19/2013   TSH 50.92* 12/04/2012   TSH 1.09 09/20/2011   TSH 2.01 10/19/2010   TSH 6.45* 04/06/2010   TSH 11.34* 12/15/2009   TSH 18.87* 08/25/2009   FREET4 1.13 10/28/2013   FREET4 1.85* 09/16/2013   FREET4 1.52 04/22/2013    Pt denies feeling nodules in neck, hoarseness, dysphagia/odynophagia, SOB with lying down.  Pt denies - cold intolerance - weight gain - fatigue - constipation - dry skin - hair falling - depression  He also has a history of colon cancer (s/p colectomy in 2010), HL, HTN, GERD.  ROS: Constitutional: no weight gain/loss, no fatigue, no subjective hyperthermia/hypothermia Eyes: no blurry vision, no xerophthalmia ENT: no sore throat, no nodules palpated in throat, no dysphagia/odynophagia, no hoarseness Cardiovascular: no CP/SOB/palpitations/leg swelling Respiratory: no cough/SOB Gastrointestinal: no N/V/D/C Musculoskeletal: no muscle/joint aches Skin: no rashes Neurological: no tremors/numbness/tingling/dizziness  I reviewed pt's medications, allergies, PMH, social hx, family hx, and changes  were documented in the history of present illness. Otherwise, unchanged from my initial visit note.  PE: BP 134/82 mmHg  Pulse 73  Temp(Src) 97.8 F (36.6 C) (Oral)  Resp 12  Wt 196 lb 12.8 oz (89.268 kg)  SpO2 94% Body mass index is 29.05 kg/(m^2).  Wt Readings from Last 3 Encounters:  03/17/14 196 lb 12.8 oz (89.268 kg)  12/03/13 196 lb (88.905 kg)  09/16/13 193 lb (87.544 kg)   Constitutional: slightly overweight, in NAD Eyes: PERRLA, EOMI, no exophthalmos ENT: moist mucous membranes, no thyromegaly, no cervical lymphadenopathy Cardiovascular: RRR, No MRG, + mild periankle swelling Respiratory: CTA B Gastrointestinal: abdomen soft, NT, ND, BS+ Musculoskeletal: no deformities, strength intact in all 4 Skin: moist, warm, no rashes Neurological: no tremor with outstretched hands, DTR undet. in all 4 (h/o Guillain-Barre)  ASSESSMENT: 1. Hypothyroidism - uncontrolled  PLAN:  1. Patient with 5 year h/o hypothyroidism, on levothyroxine therapy, with fluctuating TSH levels, mostly high, due to previously taking his medication incorrectly. He now takes it correctly. - We again discussed about correct intake of levothyroxine, fasting, with water, separated by at least 30 minutes from breakfast, and separated by more than 4 hours from calcium, iron, multivitamins, acid reflux medications (PPIs). He is now taking the Synthroid separated from his MVI and PPI - He does not appear to have a goiter, thyroid nodules, or neck compression symptoms. He appears euthyroid. - continue 125 mcg LT4 for now - he needs a 90 day refill ([place]) - will check thyroid tests today: TSH,  free T4 >> to see if we need to decrease the LT4 further - I will see him back in 6 mo  Office Visit on 03/17/2014  Component Date Value Ref Range Status  . TSH 03/17/2014 7.13* 0.35 - 4.50 uIU/mL Final  . Free T4 03/17/2014 1.32  0.60 - 1.60 ng/dL Final   Will increase LT4 to 137 mcg daily.

## 2014-03-27 ENCOUNTER — Telehealth: Payer: Self-pay | Admitting: Family Medicine

## 2014-03-27 MED ORDER — OMEPRAZOLE 20 MG PO CPDR: DELAYED_RELEASE_CAPSULE | ORAL | Status: DC

## 2014-03-27 NOTE — Telephone Encounter (Signed)
Rx sent to pharmacy   

## 2014-03-27 NOTE — Telephone Encounter (Signed)
Patient came stating that he only one pill left. He would like to have his Omeprazole 20 mg called into [facility].

## 2014-04-13 ENCOUNTER — Other Ambulatory Visit: Payer: Self-pay | Admitting: Family Medicine

## 2014-05-15 ENCOUNTER — Encounter: Payer: Self-pay | Admitting: Gastroenterology

## 2014-09-09 ENCOUNTER — Other Ambulatory Visit: Payer: Self-pay | Admitting: Family Medicine

## 2014-09-09 ENCOUNTER — Other Ambulatory Visit: Payer: Self-pay | Admitting: Internal Medicine

## 2014-09-22 ENCOUNTER — Encounter: Payer: Self-pay | Admitting: Internal Medicine

## 2014-09-22 ENCOUNTER — Ambulatory Visit (INDEPENDENT_AMBULATORY_CARE_PROVIDER_SITE_OTHER): Payer: Medicare HMO | Admitting: Internal Medicine

## 2014-09-22 VITALS — BP 122/62 | HR 65 | Temp 97.5°F | Resp 12 | Wt 196.0 lb

## 2014-09-22 DIAGNOSIS — E039 Hypothyroidism, unspecified: Secondary | ICD-10-CM | POA: Diagnosis not present

## 2014-09-22 LAB — T4, FREE: Free T4: 1.17 ng/dL (ref 0.60–1.60)

## 2014-09-22 LAB — TSH: TSH: 3.7 u[IU]/mL (ref 0.35–4.50)

## 2014-09-22 MED ORDER — LEVOTHYROXINE SODIUM 137 MCG PO TABS: ORAL_TABLET | ORAL | Status: DC

## 2014-09-22 NOTE — Progress Notes (Signed)
Patient ID: Sire Poet, male   DOB: Jan 03, 1927, 81 y.o.   MRN: [id number]   HPI  Ronald Gutierrez is a 81 y.o.-year-old male, returning for f/u for hypothyroidism, dx 2010. Last visit 6 mo ago.  Pt has poorly controlled hypothyroidism, with previous TSH levels very elevated, as he was taking his LT4 along with OJ and MVI, PPI. We separated these >> TSH became very suppressed >> we decreased the LT4 dose >> last change was increasing from 125 to 137 mcg daily in 03/2014.  He now takes the Levothyroxine 137 mcg: - fasting - with water - separated by >30 min from b'fast  - separated by only PPIs, multivitamins at 2 pm  He is not taking Biotin, steroids, not on Amiodarone.  I reviewed pt's thyroid tests: Lab Results  Component Value Date   TSH 7.13* 03/17/2014   TSH 8.85* 10/28/2013   TSH 0.009* 09/16/2013   TSH 9.19* 04/22/2013   TSH 69.61* 03/19/2013   TSH 50.92* 12/04/2012   TSH 1.09 09/20/2011   TSH 2.01 10/19/2010   TSH 6.45* 04/06/2010   TSH 11.34* 12/15/2009   FREET4 1.32 03/17/2014   FREET4 1.13 10/28/2013   FREET4 1.85* 09/16/2013   FREET4 1.52 04/22/2013    Pt denies feeling nodules in neck, hoarseness, dysphagia/odynophagia, SOB with lying down.  Pt denies: - cold intolerance - weight gain - fatigue - constipation - dry skin - hair falling - depression  He had an episode of dizziness while playing golf one weekend, but he admits it was very hot outside then.  He also has a history of colon cancer (s/p colectomy in 2010), HL, HTN, GERD.  ROS: Constitutional: no weight gain/loss, no fatigue, no subjective hyperthermia/hypothermia Eyes: no blurry vision, no xerophthalmia ENT: no sore throat, no nodules palpated in throat, no dysphagia/odynophagia, no hoarseness Cardiovascular: no CP/SOB/palpitations/leg swelling Respiratory: no cough/SOB Gastrointestinal: no N/V/D/C Musculoskeletal: no muscle/joint aches Skin: no rashes Neurological: no  tremors/numbness/tingling/dizziness  I reviewed pt's medications, allergies, PMH, social hx, family hx, and changes were documented in the history of present illness. Otherwise, unchanged from my initial visit note.  PE: BP 122/62 mmHg  Pulse 65  Temp(Src) 97.5 F (36.4 C) (Oral)  Resp 12  Wt 196 lb (88.905 kg)  SpO2 96% Body mass index is 28.93 kg/(m^2).  Wt Readings from Last 3 Encounters:  09/22/14 196 lb (88.905 kg)  03/17/14 196 lb 12.8 oz (89.268 kg)  12/03/13 196 lb (88.905 kg)   Constitutional: slightly overweight, in NAD Eyes: PERRLA, EOMI, no exophthalmos ENT: moist mucous membranes, no thyromegaly, no cervical lymphadenopathy Cardiovascular: RRR, No MRG, + mild periankle swelling Respiratory: CTA B Gastrointestinal: abdomen soft, NT, ND, BS+ Musculoskeletal: no deformities, strength intact in all 4 Skin: moist, warm, no rashes Neurological: no tremor with outstretched hands, DTR undet. in all 4 (h/o Guillain-Barre)  ASSESSMENT: 1. Hypothyroidism - uncontrolled  PLAN:  1. Patient with h/o hypothyroidism, on levothyroxine therapy, with fluctuating TSH levels, mostly high, due to previously taking his medication incorrectly. He now takes it correctly. - We again discussed about correct intake of levothyroxine, fasting, with water, separated by at least 30 minutes from breakfast, and separated by more than 4 hours from calcium, iron, multivitamins, acid reflux medications (PPIs). He is now taking the Synthroid correctly, separated from his MVI and PPI - He does not appear to have a goiter, thyroid nodules, or neck compression symptoms. He appears euthyroid. - continue 137 mcg LT4 for now - will check thyroid tests  today: TSH, free T4  - I will see him back in 6 mo  He needs a 90 day refill ([place])  Office Visit on 09/22/2014  Component Date Value Ref Range Status  . Free T4 09/22/2014 1.17  0.60 - 1.60 ng/dL Final  . TSH 09/22/2014 3.70  0.35 - 4.50  uIU/mL Final   Normal thyroid tests.

## 2014-09-22 NOTE — Patient Instructions (Signed)
Please stop at the lab.  Please come back for a follow-up appointment in 6 months.  

## 2014-09-28 ENCOUNTER — Other Ambulatory Visit: Payer: Self-pay | Admitting: Family Medicine

## 2014-10-10 ENCOUNTER — Other Ambulatory Visit: Payer: Self-pay | Admitting: Family Medicine

## 2014-11-03 ENCOUNTER — Other Ambulatory Visit (INDEPENDENT_AMBULATORY_CARE_PROVIDER_SITE_OTHER): Payer: Medicare HMO

## 2014-11-03 DIAGNOSIS — Z0000 Encounter for general adult medical examination without abnormal findings: Secondary | ICD-10-CM

## 2014-11-03 DIAGNOSIS — I10 Essential (primary) hypertension: Secondary | ICD-10-CM | POA: Diagnosis not present

## 2014-11-03 DIAGNOSIS — E785 Hyperlipidemia, unspecified: Secondary | ICD-10-CM | POA: Diagnosis not present

## 2014-11-03 LAB — CBC WITH DIFFERENTIAL/PLATELET
Basophils Absolute: 0 10*3/uL (ref 0.0–0.1)
Basophils Relative: 0.5 % (ref 0.0–3.0)
EOS ABS: 0.3 10*3/uL (ref 0.0–0.7)
EOS PCT: 4.9 % (ref 0.0–5.0)
HCT: 37.4 % — ABNORMAL LOW (ref 39.0–52.0)
HEMOGLOBIN: 12.6 g/dL — AB (ref 13.0–17.0)
LYMPHS ABS: 1.6 10*3/uL (ref 0.7–4.0)
Lymphocytes Relative: 24.2 % (ref 12.0–46.0)
MCHC: 33.7 g/dL (ref 30.0–36.0)
MCV: 96.9 fl (ref 78.0–100.0)
MONO ABS: 0.9 10*3/uL (ref 0.1–1.0)
Monocytes Relative: 13.2 % — ABNORMAL HIGH (ref 3.0–12.0)
NEUTROS PCT: 57.2 % (ref 43.0–77.0)
Neutro Abs: 3.9 10*3/uL (ref 1.4–7.7)
Platelets: 271 10*3/uL (ref 150.0–400.0)
RBC: 3.85 Mil/uL — AB (ref 4.22–5.81)
RDW: 14.3 % (ref 11.5–15.5)
WBC: 6.8 10*3/uL (ref 4.0–10.5)

## 2014-11-03 LAB — HEPATIC FUNCTION PANEL
ALT: 14 U/L (ref 0–53)
AST: 16 U/L (ref 0–37)
Albumin: 3.8 g/dL (ref 3.5–5.2)
Alkaline Phosphatase: 77 U/L (ref 39–117)
BILIRUBIN DIRECT: 0.2 mg/dL (ref 0.0–0.3)
BILIRUBIN TOTAL: 0.7 mg/dL (ref 0.2–1.2)
Total Protein: 7 g/dL (ref 6.0–8.3)

## 2014-11-03 LAB — LIPID PANEL
CHOL/HDL RATIO: 2
CHOLESTEROL: 147 mg/dL (ref 0–200)
HDL: 68.1 mg/dL (ref >39.00)
LDL CALC: 59 mg/dL (ref 0–99)
NonHDL: 78.81
TRIGLYCERIDES: 98 mg/dL (ref 0.0–149.0)
VLDL: 19.6 mg/dL (ref 0.0–40.0)

## 2014-11-03 LAB — BASIC METABOLIC PANEL
BUN: 36 mg/dL — AB (ref 6–23)
CO2: 28 meq/L (ref 19–32)
CREATININE: 1.49 mg/dL (ref 0.40–1.50)
Calcium: 9.3 mg/dL (ref 8.4–10.5)
Chloride: 105 mEq/L (ref 96–112)
GFR: 47.5 mL/min — AB (ref >60.00)
GLUCOSE: 99 mg/dL (ref 70–99)
Potassium: 5.7 mEq/L — ABNORMAL HIGH (ref 3.5–5.1)
Sodium: 139 mEq/L (ref 135–145)

## 2014-11-03 LAB — PSA: PSA: 1.98 ng/mL (ref 0.10–4.00)

## 2014-11-03 LAB — TSH: TSH: 1.99 u[IU]/mL (ref 0.35–4.50)

## 2014-11-10 ENCOUNTER — Encounter: Payer: Self-pay | Admitting: Family Medicine

## 2014-11-10 ENCOUNTER — Ambulatory Visit (INDEPENDENT_AMBULATORY_CARE_PROVIDER_SITE_OTHER): Payer: Medicare HMO | Admitting: Family Medicine

## 2014-11-10 VITALS — BP 102/60 | HR 81 | Temp 97.7°F | Ht 69.0 in | Wt 196.5 lb

## 2014-11-10 DIAGNOSIS — Z85038 Personal history of other malignant neoplasm of large intestine: Secondary | ICD-10-CM

## 2014-11-10 DIAGNOSIS — Z0000 Encounter for general adult medical examination without abnormal findings: Secondary | ICD-10-CM | POA: Diagnosis not present

## 2014-11-10 DIAGNOSIS — R252 Cramp and spasm: Secondary | ICD-10-CM

## 2014-11-10 DIAGNOSIS — Z23 Encounter for immunization: Secondary | ICD-10-CM | POA: Diagnosis not present

## 2014-11-10 LAB — BASIC METABOLIC PANEL
BUN: 35 mg/dL — AB (ref 6–23)
CO2: 27 mEq/L (ref 19–32)
CREATININE: 1.25 mg/dL (ref 0.40–1.50)
Calcium: 9.5 mg/dL (ref 8.4–10.5)
Chloride: 106 mEq/L (ref 96–112)
GFR: 58.17 mL/min — AB (ref >60.00)
Glucose, Bld: 99 mg/dL (ref 70–99)
POTASSIUM: 5 meq/L (ref 3.5–5.1)
Sodium: 138 mEq/L (ref 135–145)

## 2014-11-10 LAB — MAGNESIUM: MAGNESIUM: 2 mg/dL (ref 1.5–2.5)

## 2014-11-10 NOTE — Progress Notes (Signed)
Pre visit review using our clinic review tool, if applicable. No additional management support is needed unless otherwise documented below in the visit note. 

## 2014-11-10 NOTE — Progress Notes (Addendum)
Subjective:    Patient ID: Ronald Gutierrez, male    DOB: 1926/06/08, 81 y.o.   MRN: [id number]  HPI Patient here for complete physical. He has remote history of Guillain Barr syndrome, hypertension, hyperlipidemia, hypothyroidism, history of colon cancer, chronic mild normocytic anemia, and GERD. Reviewed and compliant with all. He is requesting handicap sticker. He is having difficulty ambulating more than 200 feet secondary to extreme fatigue. No chest pains. No dyspnea.  Needs tetanus booster. Cannot take flu shot because of GB syndrome as above. Complaining of frequent leg cramps. Generally does not drink a lot of water.  Past Medical History  Diagnosis Date  . ANEMIA   . FATIGUE 05/13/2009  . GERD 05/25/2008  . HYPERLIPIDEMIA 05/20/2008  . HYPERTENSION 05/20/2008  . KERATOSIS 10/21/2008  . Hypothyroidism   . Diverticulitis   . 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 Guillain-Barre syndrome ([place])   . Tubular adenoma of colon 10/2009   Past Surgical History  Procedure Laterality Date  . Appendectomy  1948  . Cervical laminectomy  1968  . Lumbar laminectomy  1970  . Hemorrhoid surgery    . Colectomy  07/2008    transverse  . Omentectomy    . Hernia repair      inguinal  . Cataract extraction      reports that he quit smoking about 46 years ago. His smoking use included Cigarettes. He has a 22.5 pack-year smoking history. He has never used smokeless tobacco. He reports that he drinks alcohol. He reports that he does not use illicit drugs. family history includes Heart disease in his brother; Lung cancer in his brother; Prostate cancer in his brother; Stroke in his father. Allergies  Allergen Reactions  . Ferrous Sulfate     REACTION: Stomach upset  '    Review of Systems  Constitutional: Positive for fatigue. Negative for fever, activity change, appetite change and unexpected weight change.  HENT: Negative for congestion, ear pain and trouble swallowing.   Eyes:  Negative for pain and visual disturbance.  Respiratory: Negative for cough, shortness of breath and wheezing.   Cardiovascular: Negative for chest pain and palpitations.  Gastrointestinal: Negative for nausea, vomiting, abdominal pain, diarrhea, constipation, blood in stool, abdominal distention and rectal pain.  Endocrine: Negative for polydipsia and polyuria.  Genitourinary: Negative for dysuria, hematuria and testicular pain.  Musculoskeletal: Negative for joint swelling and arthralgias.  Skin: Negative for rash.  Neurological: Negative for dizziness, syncope and headaches.  Hematological: Negative for adenopathy.  Psychiatric/Behavioral: Negative for confusion and dysphoric mood.       Objective:   Physical Exam  Constitutional: He is oriented to person, place, and time. He appears well-developed and well-nourished. No distress.  HENT:  Head: Normocephalic and atraumatic.  Right Ear: External ear normal.  Left Ear: External ear normal.  Mouth/Throat: Oropharynx is clear and moist.  Eyes: Conjunctivae and EOM are normal. Pupils are equal, round, and reactive to light.  Neck: Normal range of motion. Neck supple. No thyromegaly present.  Cardiovascular: Normal rate, regular rhythm and normal heart sounds.   No murmur heard. Pulmonary/Chest: No respiratory distress. He has no wheezes. He has no rales.  Abdominal: Soft. Bowel sounds are normal. He exhibits no distension. There is no tenderness. There is no rebound and no guarding.  He has fairly large ventral abdominal wall hernia which is soft and nontender  Musculoskeletal: He exhibits no edema.  Lymphadenopathy:    He has no cervical  adenopathy.  Neurological: He is alert and oriented to person, place, and time. He displays normal reflexes. No cranial nerve deficit.  Skin: No rash noted.  Multiple scattered seborrheic keratoses. He is followed regularly by dermatology  Psychiatric: He has a normal mood and affect.            Assessment & Plan:  #1 complete physical. Labs reviewed. He has slightly elevated potassium which is likely hemolyzed. He does take lisinopril but no recent nonsteroidals. Tetanus booster given. Flu vaccine contraindicated. Up-to-date regarding pneumonia vaccination. #2 past history of colon cancer. CEA level will be checked #3 leg cramps. Recheck basic metabolic panel. Add magnesium level. Suggest he drink some water each morning before drinking coffee and increased hydration in general.

## 2014-11-10 NOTE — Patient Instructions (Signed)
Leg Cramps Leg cramps occur when a muscle or muscles tighten and you have no control over this tightening (involuntary muscle contraction). Muscle cramps can develop in any muscle, but the most common place is in the calf muscles of the leg. Those cramps can occur during exercise or when you are at rest. Leg cramps are painful, and they may last for a few seconds to a few minutes. Cramps may return several times before they finally stop. Usually, leg cramps are not caused by a serious medical problem. In many cases, the cause is not known. Some common causes include:  Overexertion.  Overuse from repetitive motions, or doing the same thing over and over.  Remaining in a certain position for a long period of time.  Improper preparation, form, or technique while performing a sport or an activity.  Dehydration.  Injury.  Side effects of some medicines.  Abnormally low levels of the salts and ions in your blood (electrolytes), especially potassium and calcium. These levels could be low if you are taking water pills (diuretics) or if you are pregnant. HOME CARE INSTRUCTIONS Watch your condition for any changes. Taking the following actions may help to lessen any discomfort that you are feeling:  Stay well-hydrated. Drink enough fluid to keep your urine clear or pale yellow.  Try massaging, stretching, and relaxing the affected muscle. Do this for several minutes at a time.  For tight or tense muscles, use a warm towel, heating pad, or hot shower water directed to the affected area.  If you are sore or have pain after a cramp, applying ice to the affected area may relieve discomfort.  Put ice in a plastic bag.  Place a towel between your skin and the bag.  Leave the ice on for 20 minutes, 2-3 times per day.  Avoid strenuous exercise for several days if you have been having frequent leg cramps.  Make sure that your diet includes the essential minerals for your muscles to work  normally.  Take medicines only as directed by your health care provider. SEEK MEDICAL CARE IF:  Your leg cramps get more severe or more frequent, or they do not improve over time.  Your foot becomes cold, numb, or blue.   This information is not intended to replace advice given to you by your health care provider. Make sure you discuss any questions you have with your health care provider.   Document Released: 02/10/2004 Document Revised: 05/19/2014 Document Reviewed: 12/10/2013 Elsevier Interactive Patient Education 2016 [employer]   

## 2014-11-11 ENCOUNTER — Encounter: Payer: Self-pay | Admitting: Family Medicine

## 2014-11-11 LAB — CEA: CEA: 4.8 ng/mL (ref 0.0–5.0)

## 2014-12-13 ENCOUNTER — Other Ambulatory Visit: Payer: Self-pay | Admitting: Family Medicine

## 2015-01-06 ENCOUNTER — Ambulatory Visit (INDEPENDENT_AMBULATORY_CARE_PROVIDER_SITE_OTHER): Payer: Medicare HMO

## 2015-01-06 ENCOUNTER — Telehealth: Payer: Self-pay | Admitting: Family Medicine

## 2015-01-06 ENCOUNTER — Ambulatory Visit (INDEPENDENT_AMBULATORY_CARE_PROVIDER_SITE_OTHER): Payer: Medicare HMO | Admitting: Family Medicine

## 2015-01-06 VITALS — BP 112/68 | HR 82 | Temp 97.9°F | Resp 20 | Wt 191.0 lb

## 2015-01-06 DIAGNOSIS — R0989 Other specified symptoms and signs involving the circulatory and respiratory systems: Secondary | ICD-10-CM

## 2015-01-06 DIAGNOSIS — J069 Acute upper respiratory infection, unspecified: Secondary | ICD-10-CM

## 2015-01-06 DIAGNOSIS — J9811 Atelectasis: Secondary | ICD-10-CM | POA: Diagnosis not present

## 2015-01-06 DIAGNOSIS — B9789 Other viral agents as the cause of diseases classified elsewhere: Secondary | ICD-10-CM

## 2015-01-06 LAB — POCT CBC
Granulocyte percent: 57.1 %G (ref 37–80)
HEMATOCRIT: 41.3 % — AB (ref 43.5–53.7)
HEMOGLOBIN: 14.1 g/dL (ref 14.1–18.1)
LYMPH, POC: 2.5 (ref 0.6–3.4)
MCH, POC: 32.8 pg — AB (ref 27–31.2)
MCHC: 34.1 g/dL (ref 31.8–35.4)
MCV: 96.3 fL (ref 80–97)
MID (cbc): 0.7 (ref 0–0.9)
MPV: 6.7 fL (ref 0–99.8)
POC GRANULOCYTE: 4.2 (ref 2–6.9)
POC LYMPH %: 33.7 % (ref 10–50)
POC MID %: 9.2 %M (ref 0–12)
Platelet Count, POC: 275 10*3/uL (ref 142–424)
RBC: 4.28 M/uL — AB (ref 4.69–6.13)
RDW, POC: 13.9 %
WBC: 7.4 10*3/uL (ref 4.6–10.2)

## 2015-01-06 MED ORDER — HYDROCODONE-HOMATROPINE 5-1.5 MG/5ML PO SYRP: 2.5000 mL | ORAL_SOLUTION | Freq: Every evening | ORAL | Status: DC | PRN

## 2015-01-06 MED ORDER — IPRATROPIUM BROMIDE 0.03 % NA SOLN: 2.0000 | Freq: Two times a day (BID) | NASAL | Status: DC

## 2015-01-06 MED ORDER — NAPROXEN SODIUM 220 MG PO TABS: 220.0000 mg | ORAL_TABLET | Freq: Two times a day (BID) | ORAL | Status: DC

## 2015-01-06 NOTE — Progress Notes (Signed)
01/06/2015 7:21 PM   DOB: 1926-04-09 / MRN: [id number]  SUBJECTIVE:  Ronald Gutierrez is a 81 y.o. male presenting for nasal congestion and cough that started 4 days ago and he feels he is only getting worse.  Reports that his cough has become productive 2 days ago.  This all started with a sore throat which has improved.  He has tried robitussin without relief. No history of CHF.  Denies leg and abdominal swelling.  Denies orthopnea.    He is allergic to ferrous sulfate.   He  has a past medical history of ANEMIA; FATIGUE (05/13/2009); GERD (05/25/2008); HYPERLIPIDEMIA (05/20/2008); HYPERTENSION (05/20/2008); KERATOSIS (10/21/2008); Hypothyroidism; Diverticulitis; Colon adenocarcinoma ([place]) (05/2008); Guillain-Barre syndrome ([place]); and Tubular adenoma of colon (10/2009).    He  reports that he quit smoking about 47 years ago. His smoking use included Cigarettes. He has a 22.5 pack-year smoking history. He has never used smokeless tobacco. He reports that he drinks alcohol. He reports that he does not use illicit drugs. He  has no sexual activity history on file. The patient  has past surgical history that includes Appendectomy (1948); Cervical laminectomy (1968); Lumbar laminectomy (1970); Hemorrhoid surgery; Colectomy (07/2008); Omentectomy; Hernia repair; and Cataract extraction.  His family history includes Heart disease in his brother; Lung cancer in his brother; Prostate cancer in his brother; Stroke in his father.  Review of Systems  Constitutional: Negative for fever.  HENT: Positive for congestion. Negative for sore throat.   Respiratory: Positive for cough and sputum production. Negative for hemoptysis and shortness of breath.   Cardiovascular: Negative for chest pain, orthopnea and leg swelling.  Gastrointestinal: Negative for nausea.  Musculoskeletal: Negative for myalgias.    Problem list and medications reviewed and updated by myself where necessary, and exist elsewhere in the encounter.    OBJECTIVE:  BP 112/68 mmHg  Pulse 82  Temp(Src) 97.9 F (36.6 C) (Oral)  Resp 20  Wt 191 lb (86.637 kg)  SpO2 99%  Physical Exam  Constitutional: He is oriented to person, place, and time. He appears well-developed. He does not appear ill.  HENT:  Right Ear: Hearing and tympanic membrane normal.  Left Ear: Hearing and tympanic membrane normal.  Nose: Nose normal.  Mouth/Throat: Uvula is midline, oropharynx is clear and moist and mucous membranes are normal.  Eyes: Conjunctivae and EOM are normal. Pupils are equal, round, and reactive to light.  Cardiovascular: Normal rate.   Pulmonary/Chest: Effort normal. He has rales (bibasilar).  Abdominal: He exhibits no distension.  Musculoskeletal: Normal range of motion.  Neurological: He is alert and oriented to person, place, and time. No cranial nerve deficit. Coordination normal.  Skin: Skin is warm and dry. He is not diaphoretic.  Psychiatric: He has a normal mood and affect.  Nursing note and vitals reviewed.   Results for orders placed or performed in visit on 01/06/15 (from the past 48 hour(s))  POCT CBC     Status: Abnormal   Collection Time: 01/06/15  6:37 PM  Result Value Ref Range   WBC 7.4 4.6 - 10.2 K/uL   Lymph, poc 2.5 0.6 - 3.4   POC LYMPH PERCENT 33.7 10 - 50 %L   MID (cbc) 0.7 0 - 0.9   POC MID % 9.2 0 - 12 %M   POC Granulocyte 4.2 2 - 6.9   Granulocyte percent 57.1 37 - 80 %G   RBC 4.28 (A) 4.69 - 6.13 M/uL   Hemoglobin 14.1 14.1 - 18.1 g/dL  HCT, POC 41.3 (A) 43.5 - 53.7 %   MCV 96.3 80 - 97 fL   MCH, POC 32.8 (A) 27 - 31.2 pg   MCHC 34.1 31.8 - 35.4 g/dL   RDW, POC 13.9 %   Platelet Count, POC 275 142 - 424 K/uL   MPV 6.7 0 - 99.8 fL    ASSESSMENT AND PLAN  Trypp was seen today for sore throat and cough.  Diagnoses and all orders for this visit:  Chest rales: Bibasilar atelectisis per rads.  Negative for infiltrate and white count corroborates this.  Will treat for a cold and advise her return  to clinic as needed.   -     POCT CBC -     DG Chest 2 View; Future -     POCT urinalysis dipstick  Mild bibasilar atelectasis: Managed with problem 3.   Viral URI with Cough: -     ipratropium (ATROVENT) 0.03 % nasal spray; Place 2 sprays into both nostrils every 12 (twelve) hours. -     HYDROcodone-homatropine (HYCODAN) 5-1.5 MG/5ML syrup; Take 2.5-5 mLs by mouth at bedtime as needed. -     naproxen sodium (ALEVE) 220 MG tablet; Take 1 tablet (220 mg total) by mouth 2 (two) times daily with a meal.    The patient was advised to call or return to clinic if he does not see an improvement in symptoms or to seek the care of the closest emergency department if he worsens with the above plan.   Philis Fendt, MHS, PA-C [facility] 01/06/2015 7:21 PM

## 2015-01-06 NOTE — Telephone Encounter (Signed)
Could we work patient in tomorrow or Friday?

## 2015-01-06 NOTE — Telephone Encounter (Signed)
Patient Name: Ronald Gutierrez  DOB: [birth year]    Initial Comment Caller states he has had a horrible cold since this past Saturday and nothing is helping. He is also having a runny nose.   Nurse Assessment  Nurse: Thad Ranger RN, Denise Date/Time (Eastern Time): 01/06/2015 3:57:15 PM  Confirm and document reason for call. If symptomatic, describe symptoms. ---Caller states he has had a horrible cold since this past Saturday and nothing is helping. He is also having a runny nose. States he has taken 1.5 bottles of Robitussin and the med has not helped. Denies diff breathing but is wheezing. No fever.  Has the patient traveled out of the country within the last 30 days? ---Not Applicable  Does the patient have any new or worsening symptoms? ---Yes  Will a triage be completed? ---Yes  Related visit to physician within the last 2 weeks? ---No  Does the PT have any chronic conditions? (i.e. diabetes, asthma, etc.) ---No  Is this a behavioral health or substance abuse call? ---No     Guidelines    Guideline Title Affirmed Question Affirmed Notes  Cough - Acute Productive Wheezing is present    Final Disposition User   See Physician within 4 Hours (or PCP triage) Thad Ranger, RN, Denise    Referrals  [facility]   Disagree/Comply: Comply

## 2015-01-06 NOTE — Telephone Encounter (Signed)
Yes.  Try to get in tomorrow.

## 2015-01-07 NOTE — Telephone Encounter (Signed)
Sent to [place] for scheduling

## 2015-01-07 NOTE — Telephone Encounter (Signed)
Patient was seen last night at [facility]. He has walking pneumonemia. I advised that if in the next 10 days he does not have any improvement or if he begins to feel worse to call us back.

## 2015-01-13 DIAGNOSIS — L570 Actinic keratosis: Secondary | ICD-10-CM | POA: Diagnosis not present

## 2015-01-13 DIAGNOSIS — Z85828 Personal history of other malignant neoplasm of skin: Secondary | ICD-10-CM | POA: Diagnosis not present

## 2015-01-13 DIAGNOSIS — D485 Neoplasm of uncertain behavior of skin: Secondary | ICD-10-CM | POA: Diagnosis not present

## 2015-01-13 DIAGNOSIS — C44311 Basal cell carcinoma of skin of nose: Secondary | ICD-10-CM | POA: Diagnosis not present

## 2015-01-13 DIAGNOSIS — L821 Other seborrheic keratosis: Secondary | ICD-10-CM | POA: Diagnosis not present

## 2015-01-15 NOTE — Progress Notes (Signed)
History and physical examinations reviewed in detail with PA Clark.  CXR reviewed during visit and negative.  Agree with assessment and plan. Kristi Elayne Guerin, M.D. [facility] [address] [place], [place]  [address] [phone] phone [phone] fax

## 2015-01-19 DIAGNOSIS — C44311 Basal cell carcinoma of skin of nose: Secondary | ICD-10-CM | POA: Diagnosis not present

## 2015-01-19 DIAGNOSIS — Z85828 Personal history of other malignant neoplasm of skin: Secondary | ICD-10-CM | POA: Diagnosis not present

## 2015-03-10 DIAGNOSIS — R69 Illness, unspecified: Secondary | ICD-10-CM | POA: Diagnosis not present

## 2015-03-23 ENCOUNTER — Encounter: Payer: Self-pay | Admitting: Internal Medicine

## 2015-03-23 ENCOUNTER — Ambulatory Visit (INDEPENDENT_AMBULATORY_CARE_PROVIDER_SITE_OTHER): Payer: Medicare HMO | Admitting: Internal Medicine

## 2015-03-23 VITALS — BP 116/70 | HR 72 | Temp 97.8°F | Resp 12 | Wt 193.0 lb

## 2015-03-23 DIAGNOSIS — E039 Hypothyroidism, unspecified: Secondary | ICD-10-CM | POA: Diagnosis not present

## 2015-03-23 LAB — T4, FREE: FREE T4: 1.38 ng/dL (ref 0.60–1.60)

## 2015-03-23 LAB — TSH: TSH: 8.76 u[IU]/mL — AB (ref 0.35–4.50)

## 2015-03-23 NOTE — Patient Instructions (Signed)
Please stop at the lab.  Please continue Levothyroxine 137 mcg daily.  Take the thyroid hormone every day, with water, at least 30 minutes before breakfast, separated by at least 4 hours from: - acid reflux medications - calcium - iron - multivitamins  Please come back for a follow-up appointment in 1 year.

## 2015-03-23 NOTE — Progress Notes (Signed)
Patient ID: Ronald Gutierrez, male   DOB: 02-Aug-1926, 82 y.o.   MRN: [id number]   HPI  Ronald Gutierrez is a 82 y.o.-year-old male, returning for f/u for hypothyroidism, dx 2010. Last visit 6 mo ago.  He had PNS 11-12/2014  Pt has poorly controlled hypothyroidism, with previous TSH levels very elevated, as he was taking his LT4 along with OJ and MVI, PPI. We separated these >> TSH became very suppressed >> we decreased the LT4 dose >> last change was increasing from 125 to 137 mcg daily in 03/2014.  He now takes the Levothyroxine 137 mcg: - fasting - with water - separated by >30 min from b'fast  - separated by only PPIs, multivitamins at noon or afternoon  He is not taking Biotin, steroids, not on Amiodarone.  I reviewed pt's thyroid tests: Lab Results  Component Value Date   TSH 1.99 11/03/2014   TSH 3.70 09/22/2014   TSH 7.13* 03/17/2014   TSH 8.85* 10/28/2013   TSH 0.009* 09/16/2013   TSH 9.19* 04/22/2013   TSH 69.61* 03/19/2013   TSH 50.92* 12/04/2012   TSH 1.09 09/20/2011   TSH 2.01 10/19/2010   FREET4 1.17 09/22/2014   FREET4 1.32 03/17/2014   FREET4 1.13 10/28/2013   FREET4 1.85* 09/16/2013   FREET4 1.52 04/22/2013    Pt denies feeling nodules in neck, hoarseness, dysphagia/odynophagia, SOB with lying down.  Pt denies: - cold intolerance - weight gain - constipation - dry skin - hair falling - depression  He has some fatigue with exercise (when playing golf)..  He had an episode of dizziness while playing golf one weekend, but he admits it was very hot outside then.  He also has a history of colon cancer (s/p colectomy in 2010), HL, HTN, GERD.  ROS: Constitutional: no weight gain/loss, + fatigue, no subjective hyperthermia/hypothermia Eyes: no blurry vision, no xerophthalmia ENT: no sore throat, no nodules palpated in throat, no dysphagia/odynophagia, no hoarseness Cardiovascular: no CP/SOB/palpitations/leg swelling Respiratory: no cough/SOB Gastrointestinal: no  N/V/D/C Musculoskeletal: no muscle/joint aches Skin: no rashes Neurological: no tremors/numbness/tingling/dizziness  I reviewed pt's medications, allergies, PMH, social hx, family hx, and changes were documented in the history of present illness. Otherwise, unchanged from my initial visit note.  PE: BP 116/70 mmHg  Pulse 72  Temp(Src) 97.8 F (36.6 C) (Oral)  Resp 12  Wt 193 lb (87.544 kg)  SpO2 96% Body mass index is 28.49 kg/(m^2).  Wt Readings from Last 3 Encounters:  03/23/15 193 lb (87.544 kg)  01/06/15 191 lb (86.637 kg)  11/10/14 196 lb 8 oz (89.132 kg)   Constitutional: slightly overweight, in NAD Eyes: PERRLA, EOMI, no exophthalmos ENT: moist mucous membranes, no thyromegaly, no cervical lymphadenopathy Cardiovascular: RRR, No MRG, + mild periankle swelling Respiratory: CTA B Gastrointestinal: abdomen soft, NT, ND, BS+ Musculoskeletal: no deformities, strength intact in all 4 Skin: moist, warm, no rashes Neurological: no tremor with outstretched hands, DTR undet. in all 4 (h/o Guillain-Barre)  ASSESSMENT: 1. Hypothyroidism - uncontrolled  PLAN:  1. Patient with h/o hypothyroidism, on levothyroxine therapy, with fluctuating TSH levels, mostly high, due to previously taking his medication incorrectly. - We again discussed about correct intake of levothyroxine, fasting, with water, separated by at least 30 minutes from breakfast, and separated by more than 4 hours from calcium, iron, multivitamins, acid reflux medications (PPIs). He is now taking the LT4 correctly, separated from his MVI and PPI - He does not appear to have a goiter, thyroid nodules, or neck compression symptoms. He  appears euthyroid. - continue 137 mcg LT4 for now - will check thyroid tests today: TSH, free T4  - I will see him back in 1 year   Office Visit on 03/23/2015  Component Date Value Ref Range Status  . Free T4 03/23/2015 1.38  0.60 - 1.60 ng/dL Final  . TSH 03/23/2015 8.76* 0.35 - 4.50  uIU/mL Final    TSH higher than before, I suspect that he may have missed some of the levothyroxine doses. I would like to repeat his TSH in one half months, but not change the dose quite now.

## 2015-03-27 ENCOUNTER — Other Ambulatory Visit: Payer: Self-pay | Admitting: Family Medicine

## 2015-03-30 DIAGNOSIS — R69 Illness, unspecified: Secondary | ICD-10-CM | POA: Diagnosis not present

## 2015-04-20 ENCOUNTER — Encounter: Payer: Self-pay | Admitting: Adult Health

## 2015-04-20 ENCOUNTER — Ambulatory Visit (INDEPENDENT_AMBULATORY_CARE_PROVIDER_SITE_OTHER): Payer: Medicare HMO | Admitting: Adult Health

## 2015-04-20 VITALS — BP 136/88 | HR 70 | Temp 98.3°F | Wt 190.6 lb

## 2015-04-20 DIAGNOSIS — J069 Acute upper respiratory infection, unspecified: Secondary | ICD-10-CM | POA: Diagnosis not present

## 2015-04-20 MED ORDER — DOXYCYCLINE HYCLATE 100 MG PO CAPS: 100.0000 mg | ORAL_CAPSULE | Freq: Two times a day (BID) | ORAL | Status: DC

## 2015-04-20 MED ORDER — BENZONATATE 200 MG PO CAPS: 200.0000 mg | ORAL_CAPSULE | Freq: Two times a day (BID) | ORAL | Status: DC | PRN

## 2015-04-20 NOTE — Progress Notes (Signed)
Pre visit review using our clinic review tool, if applicable. No additional management support is needed unless otherwise documented below in the visit note. 

## 2015-04-20 NOTE — Patient Instructions (Signed)
It was great meeting you today!  I have sent in a prescription for Doxycycline ( antibiotic) and Tessalon Pearls ( cough suppressant).   You can also use Mucinex.   Follow up if no improvement in the next 2-3 days.

## 2015-04-20 NOTE — Progress Notes (Signed)
Subjective:    Patient ID: Ronald Gutierrez, male    DOB: 12-01-1926, 82 y.o.   MRN: [id number]  HPI  82 year old male who was diagnosed with "walking pneumonia" in November and treated with Atrovent nasal spray and Hycodan. He was not treated with any antibiotics.   He reports that for the last 10 days he has had a sore throat, fatigue, weakness, productive cough, and rhinorrhea.  Review of Systems  Constitutional: Positive for activity change and fatigue. Negative for fever, chills and diaphoresis.  HENT: Positive for congestion, ear discharge, postnasal drip, rhinorrhea and sore throat. Negative for ear pain, sinus pressure and tinnitus.   Respiratory: Positive for cough. Negative for choking, shortness of breath and wheezing.   Cardiovascular: Negative.   Gastrointestinal: Negative.   Musculoskeletal: Negative.   Psychiatric/Behavioral: Negative.   All other systems reviewed and are negative.  Past Medical History  Diagnosis Date  . ANEMIA   . FATIGUE 05/13/2009  . GERD 05/25/2008  . HYPERLIPIDEMIA 05/20/2008  . HYPERTENSION 05/20/2008  . KERATOSIS 10/21/2008  . Hypothyroidism   . Diverticulitis   . Colon adenocarcinoma ([place]) 05/2008    T4, N0  . Guillain-Barre syndrome ([place])   . Tubular adenoma of colon 10/2009    Social History   Social History  . Marital Status: Married    Spouse Name: N/A  . Number of Children: N/A  . Years of Education: N/A   Occupational History  . Retired    Social History Main Topics  . Smoking status: Former Smoker -- 1.50 packs/day for 15 years    Types: Cigarettes    Quit date: 01/17/1968  . Smokeless tobacco: Never Used  . Alcohol Use: Yes  . Drug Use: No  . Sexual Activity: Not on file   Other Topics Concern  . Not on file   Social History Narrative    Past Surgical History  Procedure Laterality Date  . Appendectomy  1948  . Cervical laminectomy  1968  . Lumbar laminectomy  1970  . Hemorrhoid surgery    . Colectomy  07/2008   transverse  . Omentectomy    . Hernia repair      inguinal  . Cataract extraction      Family History  Problem Relation Age of Onset  . Lung cancer Brother   . Heart disease Brother   . Prostate cancer Brother   . Stroke Father     Allergies  Allergen Reactions  . Ferrous Sulfate     REACTION: Stomach upset    Current Outpatient Prescriptions on File Prior to Visit  Medication Sig Dispense Refill  . aspirin 81 MG tablet Take 81 mg by mouth daily.    Marland Kitchen levothyroxine (SYNTHROID, LEVOTHROID) 137 MCG tablet TAKE 1 TABLET ONCE DAILY BEFORE BREAKFAST. 90 tablet 2  . lisinopril (PRINIVIL,ZESTRIL) 40 MG tablet TAKE 1 TABLET ONCE DAILY. 90 tablet 1  . loratadine (CLARITIN) 10 MG tablet Take 10 mg by mouth daily.      . Multiple Vitamins-Minerals (CENTRUM ULTRA MENS PO) Take 1 tablet by mouth daily.      . naproxen sodium (ALEVE) 220 MG tablet Take 1 tablet (220 mg total) by mouth 2 (two) times daily with a meal. 30 tablet 0  . omeprazole (PRILOSEC) 20 MG capsule TAKE (1) CAPSULE DAILY. 90 capsule 3  . simvastatin (ZOCOR) 40 MG tablet TAKE (1/2) TABLET DAILY. 45 tablet 2   No current facility-administered medications on file prior to visit.  BP 136/88 mmHg  Pulse 70  Temp(Src) 98.3 F (36.8 C) (Oral)  Wt 190 lb 9.6 oz (86.456 kg)  SpO2 91%       Objective:   Physical Exam  Constitutional: He is oriented to person, place, and time. He appears well-developed and well-nourished. No distress.  HENT:  Head: Normocephalic and atraumatic.  Right Ear: Hearing, tympanic membrane, external ear and ear canal normal.  Left Ear: Hearing, tympanic membrane, external ear and ear canal normal.  Nose: Rhinorrhea present. No mucosal edema. Right sinus exhibits no maxillary sinus tenderness and no frontal sinus tenderness. Left sinus exhibits no maxillary sinus tenderness and no frontal sinus tenderness.  Mouth/Throat: Oropharynx is clear and moist. No oropharyngeal exudate.  Eyes:  Conjunctivae and EOM are normal. Pupils are equal, round, and reactive to light. Right eye exhibits no discharge. Left eye exhibits no discharge.  Neck: Normal range of motion. Neck supple.  Cardiovascular: Normal rate, regular rhythm, normal heart sounds and intact distal pulses.  Exam reveals no gallop and no friction rub.   No murmur heard. Pulmonary/Chest: Effort normal and breath sounds normal. No respiratory distress. He has no wheezes. He has no rales. He exhibits no tenderness.  Lymphadenopathy:    He has no cervical adenopathy.  Neurological: He is alert and oriented to person, place, and time.  Skin: Skin is warm and dry. No rash noted. He is not diaphoretic. No erythema. No pallor.  Psychiatric: He has a normal mood and affect. His behavior is normal. Judgment and thought content normal.  Nursing note and vitals reviewed.     Assessment & Plan:  1. Acute upper respiratory infection - No concern for pneumonia or bronchitis at this time- doxycycline (VIBRAMYCIN) 100 MG capsule; Take 1 capsule (100 mg total) by mouth 2 (two) times daily.  Dispense: 20 capsule; Refill: 0 - benzonatate (TESSALON) 200 MG capsule; Take 1 capsule (200 mg total) by mouth 2 (two) times daily as needed for cough.  Dispense: 20 capsule; Refill: 0  Dorothyann Peng, NP

## 2015-05-11 ENCOUNTER — Other Ambulatory Visit: Payer: Self-pay | Admitting: Internal Medicine

## 2015-05-11 ENCOUNTER — Other Ambulatory Visit (INDEPENDENT_AMBULATORY_CARE_PROVIDER_SITE_OTHER): Payer: Medicare HMO

## 2015-05-11 DIAGNOSIS — E039 Hypothyroidism, unspecified: Secondary | ICD-10-CM

## 2015-05-11 LAB — T4, FREE: Free T4: 1.4 ng/dL (ref 0.60–1.60)

## 2015-05-11 LAB — TSH: TSH: 4.83 u[IU]/mL — AB (ref 0.35–4.50)

## 2015-06-18 ENCOUNTER — Emergency Department (HOSPITAL_COMMUNITY)
Admission: EM | Admit: 2015-06-18 | Discharge: 2015-06-18 | Disposition: A | Discharge status: Home / Self Care | Payer: Medicare HMO | Attending: Emergency Medicine | Admitting: Emergency Medicine

## 2015-06-18 ENCOUNTER — Encounter (HOSPITAL_COMMUNITY): Payer: Self-pay | Admitting: Emergency Medicine

## 2015-06-18 ENCOUNTER — Emergency Department (HOSPITAL_COMMUNITY): Payer: Medicare HMO

## 2015-06-18 ENCOUNTER — Telehealth: Payer: Self-pay | Admitting: Family Medicine

## 2015-06-18 DIAGNOSIS — Y999 Unspecified external cause status: Secondary | ICD-10-CM | POA: Insufficient documentation

## 2015-06-18 DIAGNOSIS — S39011A Strain of muscle, fascia and tendon of abdomen, initial encounter: Secondary | ICD-10-CM

## 2015-06-18 DIAGNOSIS — Z79899 Other long term (current) drug therapy: Secondary | ICD-10-CM | POA: Diagnosis not present

## 2015-06-18 DIAGNOSIS — E785 Hyperlipidemia, unspecified: Secondary | ICD-10-CM | POA: Insufficient documentation

## 2015-06-18 DIAGNOSIS — Z7982 Long term (current) use of aspirin: Secondary | ICD-10-CM | POA: Diagnosis not present

## 2015-06-18 DIAGNOSIS — Y929 Unspecified place or not applicable: Secondary | ICD-10-CM | POA: Diagnosis not present

## 2015-06-18 DIAGNOSIS — I10 Essential (primary) hypertension: Secondary | ICD-10-CM | POA: Insufficient documentation

## 2015-06-18 DIAGNOSIS — R600 Localized edema: Secondary | ICD-10-CM | POA: Insufficient documentation

## 2015-06-18 DIAGNOSIS — K5730 Diverticulosis of large intestine without perforation or abscess without bleeding: Secondary | ICD-10-CM | POA: Diagnosis not present

## 2015-06-18 DIAGNOSIS — S3991XA Unspecified injury of abdomen, initial encounter: Secondary | ICD-10-CM | POA: Diagnosis present

## 2015-06-18 DIAGNOSIS — E039 Hypothyroidism, unspecified: Secondary | ICD-10-CM | POA: Insufficient documentation

## 2015-06-18 DIAGNOSIS — Z87891 Personal history of nicotine dependence: Secondary | ICD-10-CM | POA: Diagnosis not present

## 2015-06-18 DIAGNOSIS — X58XXXA Exposure to other specified factors, initial encounter: Secondary | ICD-10-CM | POA: Diagnosis not present

## 2015-06-18 DIAGNOSIS — Y939 Activity, unspecified: Secondary | ICD-10-CM | POA: Diagnosis not present

## 2015-06-18 LAB — COMPREHENSIVE METABOLIC PANEL
ALBUMIN: 3.9 g/dL (ref 3.5–5.0)
ALK PHOS: 73 U/L (ref 38–126)
ALT: 14 U/L — ABNORMAL LOW (ref 17–63)
ANION GAP: 7 (ref 5–15)
AST: 21 U/L (ref 15–41)
BILIRUBIN TOTAL: 0.8 mg/dL (ref 0.3–1.2)
BUN: 21 mg/dL — AB (ref 6–20)
CALCIUM: 8.9 mg/dL (ref 8.9–10.3)
CO2: 25 mmol/L (ref 22–32)
Chloride: 105 mmol/L (ref 101–111)
Creatinine, Ser: 1.1 mg/dL (ref 0.61–1.24)
GFR calc Af Amer: >60 mL/min (ref >60)
GFR, EST NON AFRICAN AMERICAN: 59 mL/min — AB (ref >60)
GLUCOSE: 114 mg/dL — AB (ref 65–99)
POTASSIUM: 4 mmol/L (ref 3.5–5.1)
Sodium: 137 mmol/L (ref 135–145)
TOTAL PROTEIN: 7.6 g/dL (ref 6.5–8.1)

## 2015-06-18 LAB — URINALYSIS, ROUTINE W REFLEX MICROSCOPIC
BILIRUBIN URINE: NEGATIVE
Glucose, UA: NEGATIVE mg/dL
Hgb urine dipstick: NEGATIVE
KETONES UR: NEGATIVE mg/dL
LEUKOCYTES UA: NEGATIVE
NITRITE: NEGATIVE
PROTEIN: NEGATIVE mg/dL
Specific Gravity, Urine: 1.018 (ref 1.005–1.030)
pH: 5 (ref 5.0–8.0)

## 2015-06-18 LAB — CBC
HCT: 36.4 % — ABNORMAL LOW (ref 39.0–52.0)
Hemoglobin: 12.2 g/dL — ABNORMAL LOW (ref 13.0–17.0)
MCH: 32.4 pg (ref 26.0–34.0)
MCHC: 33.5 g/dL (ref 30.0–36.0)
MCV: 96.8 fL (ref 78.0–100.0)
Platelets: 278 10*3/uL (ref 150–400)
RBC: 3.76 MIL/uL — ABNORMAL LOW (ref 4.22–5.81)
RDW: 13.8 % (ref 11.5–15.5)
WBC: 12.2 10*3/uL — AB (ref 4.0–10.5)

## 2015-06-18 LAB — LIPASE, BLOOD: Lipase: 37 U/L (ref 11–51)

## 2015-06-18 LAB — I-STAT CG4 LACTIC ACID, ED: LACTIC ACID, VENOUS: 1.59 mmol/L (ref 0.5–2.0)

## 2015-06-18 MED ORDER — IOPAMIDOL (ISOVUE-300) INJECTION 61%
100.0000 mL | Freq: Once | INTRAVENOUS | Status: AC | PRN
  Administered 2015-06-18: 100 mL via INTRAVENOUS

## 2015-06-18 MED ORDER — HYDROCODONE-ACETAMINOPHEN 5-325 MG PO TABS
1.0000 | ORAL_TABLET | Freq: Once | ORAL | Status: AC
  Administered 2015-06-18: 1 via ORAL
  Filled 2015-06-18: qty 1

## 2015-06-18 MED ORDER — DIATRIZOATE MEGLUMINE & SODIUM 66-10 % PO SOLN: 15.0000 mL | ORAL | Status: DC | PRN

## 2015-06-18 NOTE — Telephone Encounter (Signed)
FYI

## 2015-06-18 NOTE — ED Notes (Signed)
Patient presents for RLQ abdominal pain x1 day. Yesterday had N/V and patient feels he has torn something. Denies diarrhea, no urinary symptoms. Sent by PCP. Rates pain 8/10.

## 2015-06-18 NOTE — Telephone Encounter (Signed)
atient Name: Ronald Gutierrez DOB: [birth year] Initial Comment Caller states thinks he got food poisoning yesterday, having bad stomach pain from vomiting, he says he has torn something Nurse Assessment Nurse: Vallery Sa, RN, [place] Date/Time (Eastern Time): 06/18/2015 2:51:05 PM Confirm and document reason for call. If symptomatic, describe symptoms. You must click the next button to save text entered. ---Caller states he had vomiting yesterday. No vomiting today. He developed lower, right abdominal pain yesterday that is worse today (rated as a 8 on the 1 to 10 scale). No fever today. No injury in the past 3 days, but he thinks he may have injured something while vomiting yesterday) Has the patient traveled out of the country within the last 30 days? ---No Does the patient have any new or worsening symptoms? ---Yes Will a triage be completed? ---Yes Related visit to physician within the last 2 weeks? ---No Does the PT have any chronic conditions? (i.e. diabetes, asthma, etc.) ---Yes List chronic conditions. ---High Blood Pressure and cholesterol, Thyroid problems Is this a behavioral health or substance abuse call? ---No Guidelines Guideline Title Affirmed Question Affirmed Notes Abdominal Pain - Male [1] SEVERE pain AND [2] age > 15 Final Disposition User Go to ED Now Vallery Sa, [place], [place] Referrals [facility] Disagree/Comply: Comply

## 2015-06-18 NOTE — Discharge Instructions (Signed)

## 2015-06-18 NOTE — ED Provider Notes (Signed)
CSN: [id number]     Arrival date & time 06/18/15  1526 History   First MD Initiated Contact with Patient 06/18/15 1610     Chief Complaint  Patient presents with  . Abdominal Pain     (Consider location/radiation/quality/duration/timing/severity/associated sxs/prior Treatment) HPI Comments: 82 year old male with past history including hypertension, hyperlipidemia, diverticulitis, colon cancer who presents with abdominal pain. Yesterday the patient began having right lower quadrant pain which has been constant since it began. The pain is mild when he is sitting still but becomes much worse when he stands up and tries to walk around. He had nausea and vomiting yesterday but denies any vomiting today. He had a fever around 3 AM for which his wife gave him Tylenol. No diarrhea or blood in his stool. No urinary symptoms. He has had a runny nose and congestion since December well as some ear fullness bilaterally. He was sent here for further evaluation by his PCP.  Patient is a 82 y.o. male presenting with abdominal pain. The history is provided by the patient.  Abdominal Pain   Past Medical History  Diagnosis Date  . ANEMIA   . FATIGUE 05/13/2009  . GERD 05/25/2008  . HYPERLIPIDEMIA 05/20/2008  . HYPERTENSION 05/20/2008  . KERATOSIS 10/21/2008  . Hypothyroidism   . Diverticulitis   . Colon adenocarcinoma ([place]) 05/2008    T4, N0  . Guillain-Barre syndrome ([place])   . Tubular adenoma of colon 10/2009   Past Surgical History  Procedure Laterality Date  . Appendectomy  1948  . Cervical laminectomy  1968  . Lumbar laminectomy  1970  . Hemorrhoid surgery    . Colectomy  07/2008    transverse  . Omentectomy    . Hernia repair      inguinal  . Cataract extraction     Family History  Problem Relation Age of Onset  . Lung cancer Brother   . Heart disease Brother   . Prostate cancer Brother   . Stroke Father    Social History  Substance Use Topics  . Smoking status: Former Smoker -- 1.50  packs/day for 15 years    Types: Cigarettes    Quit date: 01/17/1968  . Smokeless tobacco: Never Used  . Alcohol Use: Yes    Review of Systems  Gastrointestinal: Positive for abdominal pain.   10 Systems reviewed and are negative for acute change except as noted in the HPI.    Allergies  Ferrous sulfate  Home Medications   Prior to Admission medications   Medication Sig Start Date End Date Taking? Authorizing Provider  acetaminophen (TYLENOL) 500 MG tablet Take 1,000 mg by mouth every 6 (six) hours as needed for fever.   Yes Historical Provider, MD  aspirin 81 MG tablet Take 81 mg by mouth daily.   Yes Historical Provider, MD  levothyroxine (SYNTHROID, LEVOTHROID) 137 MCG tablet TAKE 1 TABLET ONCE DAILY BEFORE BREAKFAST. 09/22/14  Yes Philemon Kingdom, MD  lisinopril (PRINIVIL,ZESTRIL) 40 MG tablet TAKE 1 TABLET ONCE DAILY. 03/29/15  Yes Eulas Post, MD  loratadine (CLARITIN) 10 MG tablet Take 10 mg by mouth daily.     Yes Historical Provider, MD  Multiple Vitamins-Minerals (CENTRUM ULTRA MENS PO) Take 1 tablet by mouth daily.     Yes Historical Provider, MD  omeprazole (PRILOSEC) 20 MG capsule TAKE (1) CAPSULE DAILY. 12/14/14  Yes Eulas Post, MD  simvastatin (ZOCOR) 40 MG tablet TAKE (1/2) TABLET DAILY. 10/12/14  Yes Eulas Post, MD  benzonatate (TESSALON)  200 MG capsule Take 1 capsule (200 mg total) by mouth 2 (two) times daily as needed for cough. Patient not taking: Reported on 06/18/2015 04/20/15   Dorothyann Peng, NP  doxycycline (VIBRAMYCIN) 100 MG capsule Take 1 capsule (100 mg total) by mouth 2 (two) times daily. Patient not taking: Reported on 06/18/2015 04/20/15   Dorothyann Peng, NP  naproxen sodium (ALEVE) 220 MG tablet Take 1 tablet (220 mg total) by mouth 2 (two) times daily with a meal. Patient not taking: Reported on 06/18/2015 01/06/15   Tereasa Coop, PA-C   BP 141/79 mmHg  Pulse 68  Temp(Src) 97.2 F (36.2 C) (Oral)  Resp 20  SpO2 98% Physical Exam   Constitutional: He is oriented to person, place, and time. He appears well-developed and well-nourished. No distress.  HENT:  Head: Normocephalic and atraumatic.  Moist mucous membranes  Eyes: Conjunctivae are normal. Pupils are equal, round, and reactive to light.  Neck: Neck supple.  Cardiovascular: Normal rate, regular rhythm and normal heart sounds.   No murmur heard. Pulmonary/Chest: Effort normal and breath sounds normal.  Abdominal: Soft. Bowel sounds are normal. He exhibits no distension. There is tenderness (RLQ). There is no rebound and no guarding.  Upper abdominal scar with easily reducible hernia, non-tender  Musculoskeletal: He exhibits edema (1+ pitting BLE).  Neurological: He is alert and oriented to person, place, and time.  Fluent speech  Skin: Skin is warm and dry.  Psychiatric: He has a normal mood and affect. Judgment normal.  Nursing note and vitals reviewed.   ED Course  Procedures (including critical care time) Labs Review Labs Reviewed  COMPREHENSIVE METABOLIC PANEL - Abnormal; Notable for the following:    Glucose, Bld 114 (*)    BUN 21 (*)    ALT 14 (*)    GFR calc non Af Amer 59 (*)    All other components within normal limits  CBC - Abnormal; Notable for the following:    WBC 12.2 (*)    RBC 3.76 (*)    Hemoglobin 12.2 (*)    HCT 36.4 (*)    All other components within normal limits  LIPASE, BLOOD  URINALYSIS, ROUTINE W REFLEX MICROSCOPIC (NOT AT [facility])  I-STAT CG4 LACTIC ACID, ED    Imaging Review Ct Abdomen Pelvis W Contrast  06/18/2015  CLINICAL DATA:  Right lower quadrant pain for 1 day. EXAM: CT ABDOMEN AND PELVIS WITH CONTRAST TECHNIQUE: Multidetector CT imaging of the abdomen and pelvis was performed using the standard protocol following bolus administration of intravenous contrast. CONTRAST:  169m ISOVUE-300 IOPAMIDOL (ISOVUE-300) INJECTION 61% COMPARISON:  03/03/2009 FINDINGS: Lower chest: Areas of fibrosis in the lung bases. No  effusions. Heart is enlarged. Moderate-sized hiatal hernia. Hepatobiliary: No focal hepatic abnormality. Gallbladder unremarkable. Pancreas: No focal abnormality or ductal dilatation. Spleen: No focal abnormality.  Normal size. Adrenals/Urinary Tract: No adrenal abnormality. No focal renal abnormality. No stones or hydronephrosis. Urinary bladder is unremarkable. Stomach/Bowel: Diffuse colonic diverticulosis, most pronounced in the descending and sigmoid colon. No active diverticulitis. Appendix is not visualized. No inflammatory process in the right lower quadrant. Small bowel and stomach are decompressed. Vascular/Lymphatic: Diffuse aortic calcifications without aneurysm. No adenopathy. Reproductive: Prostate enlargement. Other: At least 2-3 ventral hernias noted containing small bowel loops without evidence of obstruction or incarceration. Musculoskeletal: Diffuse degenerative changes in the mid and lower lumbar spine. No acute bony abnormality. IMPRESSION: Colonic diverticulosis, most pronounced in the left colon. No active diverticulitis. Multiple ventral hernias containing small bowel loops.  No obstruction. Moderate-sized hiatal hernia. Electronically Signed   By: Rolm Baptise M.D.   On: 06/18/2015 19:04   I have personally reviewed and evaluated these lab results as part of my medical decision-making.    MDM   Final diagnoses:  Strain of muscle, fascia and tendon of abdomen, initial encounter   Pt p/w 1 day of RLQ pain That is worse when walking or moving around as well as some vomiting that occurred yesterday. On exam, he was focally tender in the right lower quadrant with no tenderness around his upper incisional hernia. He has a lower abdominal incision from previous appendicitis surgery but had no tenderness over this incision site. Obtained above lab work as well as CT scan to evaluate for acute intra-abdominal process.  Labwork showed WBC 12.2, normal creatinine, normal UA, normal lactate.  CT showed no acute findings to explain the patient's pain. On reexamination, the patient was comfortable and stated that his pain only occurs when he tries to sit up. I suspect he may have an abdominal wall muscle strain as he has point tenderness and pain only with certain movements. He is otherwise well-appearing and has had no vomiting in the ED. Based on his reassuring vital signs and reassuring CT, I feel he is safe for discharge home. I discussed supportive care instructions and the patient voiced understanding of return precautions. Patient to follow-up with PCP in a few days if not improved. Patient discharged in satisfactory condition.   Sharlett Iles, MD 06/20/15 [phone]

## 2015-07-02 ENCOUNTER — Telehealth: Payer: Self-pay | Admitting: Family Medicine

## 2015-07-02 NOTE — Telephone Encounter (Signed)
Patient Name: Ronald Gutierrez  DOB: [birth year]    Initial Comment Caller states c/o low blood pressure 128/62, fatigued, weight loss, ear and sinus congestion.   Nurse Assessment  Nurse: Raphael Gibney, RN, Vanita Ingles Date/Time (Eastern Time): 07/02/2015 12:53:39 PM  Confirm and document reason for call. If symptomatic, describe symptoms. You must click the next button to save text entered. ---caller states he has BP of 128/62. Has had sinus congestion for a couple of weeks and his ears are congestion. He is fatigued. He had to stop playing golf yesterday after playing 6 holes due to being weak. BP 110/44 on Wednesday.  Has the patient traveled out of the country within the last 30 days? ---Not Applicable  Does the patient have any new or worsening symptoms? ---Yes  Will a triage be completed? ---Yes  Related visit to physician within the last 2 weeks? ---No  Does the PT have any chronic conditions? (i.e. diabetes, asthma, etc.) ---Yes  List chronic conditions. ---HTN  Is this a behavioral health or substance abuse call? ---No     Guidelines    Guideline Title Affirmed Question Affirmed Notes  Sinus Pain or Congestion [1] Sinus congestion (pressure, fullness) AND [2] present > 10 days    Final Disposition User   See PCP When Office is Open (within 3 days) Raphael Gibney, RN, Vanita Ingles    Comments  Appt scheduled for 07/05/2015 at 10:30 am with Dr. Carolann Littler   Referrals  REFERRED TO PCP OFFICE   Disagree/Comply: Comply

## 2015-07-02 NOTE — Telephone Encounter (Signed)
Spoke with patient who stated he has an appointment Monday with Dr. Elease Hashimoto and he only wants to see Dr. Elease Hashimoto.

## 2015-07-05 ENCOUNTER — Encounter: Payer: Self-pay | Admitting: Family Medicine

## 2015-07-05 ENCOUNTER — Ambulatory Visit (INDEPENDENT_AMBULATORY_CARE_PROVIDER_SITE_OTHER): Payer: Medicare HMO | Admitting: Family Medicine

## 2015-07-05 VITALS — BP 140/82 | HR 86 | Temp 98.1°F | Wt 186.0 lb

## 2015-07-05 DIAGNOSIS — T675XXA Heat exhaustion, unspecified, initial encounter: Secondary | ICD-10-CM

## 2015-07-05 DIAGNOSIS — J019 Acute sinusitis, unspecified: Secondary | ICD-10-CM

## 2015-07-05 DIAGNOSIS — R109 Unspecified abdominal pain: Secondary | ICD-10-CM

## 2015-07-05 MED ORDER — AMOXICILLIN 875 MG PO TABS: 875.0000 mg | ORAL_TABLET | Freq: Two times a day (BID) | ORAL | Status: DC

## 2015-07-05 NOTE — Patient Instructions (Signed)

## 2015-07-05 NOTE — Progress Notes (Signed)
Pre visit review using our clinic review tool, if applicable. No additional management support is needed unless otherwise documented below in the visit note. 

## 2015-07-05 NOTE — Progress Notes (Signed)
Subjective:    Patient ID: Ronald Gutierrez, male    DOB: 1926-02-04, 82 y.o.   MRN: [id number]  HPI Patient seen with increased malaise and sinus congestion over the past couple weeks. Yellowish nasal discharge. Bilateral maxillary sinus pressure. Denies any headache. No fever. Mild sore throat intermittently. Occasional dry cough.  Recently had episode after playing golf when he noted sharp pain right lower abdominal region. Went to emergency department. He had thorough workup there including CT abdomen and pelvis which showed multiple ventral hernias but no acute findings. White count 12.2 thousand otherwise labs normal. He had one episode of vomiting and had some loose stools.  He was doing better but then last Thursday on a very hot and humid day after the sixth hole he felt as if he were going "to collapse ". He did not have any actual syncope. After getting out of the heat and hydrating felt somewhat better. He denied any chest pains. No recent dysuria. No dyspnea. No focal weakness, confusion, or any speech changes. Hypothyroidism and recent TSH was normal He feels he is sleeping fairly well  Past Medical History  Diagnosis Date  . ANEMIA   . FATIGUE 05/13/2009  . GERD 05/25/2008  . HYPERLIPIDEMIA 05/20/2008  . HYPERTENSION 05/20/2008  . KERATOSIS 10/21/2008  . Hypothyroidism   . Diverticulitis   . Colon adenocarcinoma ([place]) 05/2008    T4, N0  . Guillain-Barre syndrome ([place])   . Tubular adenoma of colon 10/2009   Past Surgical History  Procedure Laterality Date  . Appendectomy  1948  . Cervical laminectomy  1968  . Lumbar laminectomy  1970  . Hemorrhoid surgery    . Colectomy  07/2008    transverse  . Omentectomy    . Hernia repair      inguinal  . Cataract extraction      reports that he quit smoking about 47 years ago. His smoking use included Cigarettes. He has a 22.5 pack-year smoking history. He has never used smokeless tobacco. He reports that he drinks alcohol. He  reports that he does not use illicit drugs. family history includes Heart disease in his brother; Lung cancer in his brother; Prostate cancer in his brother; Stroke in his father. Allergies  Allergen Reactions  . Ferrous Sulfate     REACTION: Stomach upset      Review of Systems  Constitutional: Positive for fatigue. Negative for fever, chills and unexpected weight change.  HENT: Positive for congestion and sinus pressure.   Respiratory: Positive for cough. Negative for shortness of breath.   Cardiovascular: Negative for chest pain.  Gastrointestinal: Negative for nausea, vomiting and abdominal pain.  Genitourinary: Negative for dysuria.  Skin: Negative for rash.  Neurological: Negative for headaches.  Psychiatric/Behavioral: Negative for confusion.       Objective:   Physical Exam  Constitutional: He appears well-developed and well-nourished.  HENT:  Right Ear: External ear normal.  Left Ear: External ear normal.  Mouth/Throat: Oropharynx is clear and moist.  Neck: Neck supple.  Cardiovascular: Normal rate and regular rhythm.   Pulmonary/Chest: Effort normal and breath sounds normal. No respiratory distress. He has no wheezes. He has no rales.  Musculoskeletal: He exhibits no edema.  Lymphadenopathy:    He has no cervical adenopathy.          Assessment & Plan:  #1 probable acute sinusitis. He's had couple weeks of progressive maxillary facial pain with yellowish discharge. Start amoxicillin 875 mg twice daily for 10 days. Stay  well-hydrated.  #2 recent right lower abdominal pain. Possibly related to muscle strain. Those symptoms have resolved   # 3 recent dizziness-probably heat exhaustion. With discussed measures to prevent heat related illness  Eulas Post MD [facility]

## 2015-07-07 ENCOUNTER — Other Ambulatory Visit: Payer: Self-pay | Admitting: Family Medicine

## 2015-07-07 DIAGNOSIS — L821 Other seborrheic keratosis: Secondary | ICD-10-CM | POA: Diagnosis not present

## 2015-07-07 DIAGNOSIS — Z85828 Personal history of other malignant neoplasm of skin: Secondary | ICD-10-CM | POA: Diagnosis not present

## 2015-07-07 DIAGNOSIS — D485 Neoplasm of uncertain behavior of skin: Secondary | ICD-10-CM | POA: Diagnosis not present

## 2015-07-07 DIAGNOSIS — L570 Actinic keratosis: Secondary | ICD-10-CM | POA: Diagnosis not present

## 2015-07-12 ENCOUNTER — Other Ambulatory Visit: Payer: Self-pay | Admitting: Internal Medicine

## 2015-07-12 NOTE — Telephone Encounter (Signed)
Medication refill request completed.

## 2015-07-21 ENCOUNTER — Telehealth: Payer: Self-pay | Admitting: Family Medicine

## 2015-07-21 NOTE — Telephone Encounter (Signed)
i would broaden this to Augmentin 875 mg po bid for 10 days. Would consider limited CT of sinuses if no better after that.

## 2015-07-21 NOTE — Telephone Encounter (Signed)
Pt states his sinus infection is not better and dr Elease Hashimoto advised pt to call back if  he was not better for a refill on  amoxicillin (AMOXIL) 875 MG tablet  [place]  Pt would like a call when done.

## 2015-07-22 ENCOUNTER — Other Ambulatory Visit: Payer: Self-pay | Admitting: *Deleted

## 2015-07-22 MED ORDER — AMOXICILLIN-POT CLAVULANATE 875-125 MG PO TABS: 1.0000 | ORAL_TABLET | Freq: Two times a day (BID) | ORAL | Status: DC

## 2015-07-22 NOTE — Telephone Encounter (Signed)
Sent in Augmentin '875mg'$  #20 take bid to [facility] per Dr. Elease Hashimoto. Notified patient

## 2015-10-04 ENCOUNTER — Other Ambulatory Visit: Payer: Self-pay | Admitting: Family Medicine

## 2015-10-27 ENCOUNTER — Other Ambulatory Visit: Payer: Self-pay | Admitting: Internal Medicine

## 2015-11-16 ENCOUNTER — Ambulatory Visit (INDEPENDENT_AMBULATORY_CARE_PROVIDER_SITE_OTHER): Payer: Medicare HMO | Admitting: Family Medicine

## 2015-11-16 ENCOUNTER — Encounter: Payer: Self-pay | Admitting: Family Medicine

## 2015-11-16 VITALS — BP 144/72 | HR 74 | Ht 67.5 in | Wt 183.0 lb

## 2015-11-16 DIAGNOSIS — Z8669 Personal history of other diseases of the nervous system and sense organs: Secondary | ICD-10-CM

## 2015-11-16 DIAGNOSIS — Z0000 Encounter for general adult medical examination without abnormal findings: Secondary | ICD-10-CM | POA: Diagnosis not present

## 2015-11-16 DIAGNOSIS — I10 Essential (primary) hypertension: Secondary | ICD-10-CM | POA: Diagnosis not present

## 2015-11-16 DIAGNOSIS — Z85038 Personal history of other malignant neoplasm of large intestine: Secondary | ICD-10-CM

## 2015-11-16 LAB — LIPID PANEL
CHOLESTEROL: 155 mg/dL (ref 0–200)
HDL: 72.4 mg/dL (ref >39.00)
LDL Cholesterol: 60 mg/dL (ref 0–99)
NONHDL: 83.02
Total CHOL/HDL Ratio: 2
Triglycerides: 114 mg/dL (ref 0.0–149.0)
VLDL: 22.8 mg/dL (ref 0.0–40.0)

## 2015-11-16 LAB — CBC WITH DIFFERENTIAL/PLATELET
BASOS PCT: 0.4 % (ref 0.0–3.0)
Basophils Absolute: 0 10*3/uL (ref 0.0–0.1)
EOS PCT: 5.5 % — AB (ref 0.0–5.0)
Eosinophils Absolute: 0.4 10*3/uL (ref 0.0–0.7)
HEMATOCRIT: 36.5 % — AB (ref 39.0–52.0)
HEMOGLOBIN: 12.4 g/dL — AB (ref 13.0–17.0)
Lymphocytes Relative: 18.4 % (ref 12.0–46.0)
Lymphs Abs: 1.3 10*3/uL (ref 0.7–4.0)
MCHC: 34 g/dL (ref 30.0–36.0)
MCV: 97.3 fl (ref 78.0–100.0)
MONOS PCT: 10.4 % (ref 3.0–12.0)
Monocytes Absolute: 0.7 10*3/uL (ref 0.1–1.0)
Neutro Abs: 4.7 10*3/uL (ref 1.4–7.7)
Neutrophils Relative %: 65.3 % (ref 43.0–77.0)
Platelets: 269 10*3/uL (ref 150.0–400.0)
RBC: 3.75 Mil/uL — AB (ref 4.22–5.81)
RDW: 13.8 % (ref 11.5–15.5)
WBC: 7.2 10*3/uL (ref 4.0–10.5)

## 2015-11-16 LAB — BASIC METABOLIC PANEL
BUN: 21 mg/dL (ref 6–23)
CALCIUM: 9.5 mg/dL (ref 8.4–10.5)
CO2: 26 mEq/L (ref 19–32)
Chloride: 107 mEq/L (ref 96–112)
Creatinine, Ser: 0.87 mg/dL (ref 0.40–1.50)
GFR: 88.16 mL/min (ref >60.00)
GLUCOSE: 94 mg/dL (ref 70–99)
Potassium: 5 mEq/L (ref 3.5–5.1)
SODIUM: 141 meq/L (ref 135–145)

## 2015-11-16 LAB — HEPATIC FUNCTION PANEL
ALBUMIN: 3.9 g/dL (ref 3.5–5.2)
ALT: 14 U/L (ref 0–53)
AST: 19 U/L (ref 0–37)
Alkaline Phosphatase: 81 U/L (ref 39–117)
Bilirubin, Direct: 0.2 mg/dL (ref 0.0–0.3)
TOTAL PROTEIN: 7.1 g/dL (ref 6.0–8.3)
Total Bilirubin: 0.7 mg/dL (ref 0.2–1.2)

## 2015-11-16 NOTE — Progress Notes (Signed)
Subjective:     Patient ID: Ronald Gutierrez, male   DOB: July 08, 1926, 82 y.o.   MRN: [id number]  HPI Patient seen for physical exam. Past medical history significant for colon cancer, hyperlipidemia, history of Guillain- barr syndrome, hypertension, hypothyroidism, GERD. Medications reviewed. Compliant with all. He had TSH last April which was normal and has been followed by endocrinology for that. He checks his blood pressure twice per week and has been getting consistent systolic readings less than 150. He is very active and still plays golf 2-3 times per week. Recently has had some bilateral ear congestion. No vertigo. No hearing changes.  Past Medical History:  Diagnosis Date  . ANEMIA   . Colon adenocarcinoma ([place]) 05/2008   T4, N0  . Diverticulitis   . FATIGUE 05/13/2009  . GERD 05/25/2008  . Guillain-Barre syndrome ([place])   . HYPERLIPIDEMIA 05/20/2008  . HYPERTENSION 05/20/2008  . Hypothyroidism   . KERATOSIS 10/21/2008  . Tubular adenoma of colon 10/2009   Past Surgical History:  Procedure Laterality Date  . APPENDECTOMY  1948  . CATARACT EXTRACTION    . CERVICAL LAMINECTOMY  1968  . COLECTOMY  07/2008   transverse  . HEMORRHOID SURGERY    . HERNIA REPAIR     inguinal  . LUMBAR LAMINECTOMY  1970  . OMENTECTOMY      reports that he quit smoking about 47 years ago. His smoking use included Cigarettes. He has a 22.50 pack-year smoking history. He has never used smokeless tobacco. He reports that he drinks alcohol. He reports that he does not use drugs. family history includes Heart disease in his brother; Lung cancer in his brother; Prostate cancer in his brother; Stroke in his father. Allergies  Allergen Reactions  . Ferrous Sulfate     REACTION: Stomach upset     Review of Systems  Constitutional: Negative for activity change, appetite change, fatigue and fever.  HENT: Negative for congestion, ear pain and trouble swallowing.   Eyes: Negative for pain and visual disturbance.   Respiratory: Negative for cough, shortness of breath and wheezing.   Cardiovascular: Negative for chest pain and palpitations.  Gastrointestinal: Negative for abdominal distention, abdominal pain, blood in stool, constipation, diarrhea, nausea, rectal pain and vomiting.  Endocrine: Negative for polydipsia and polyuria.  Genitourinary: Negative for dysuria, hematuria and testicular pain.  Musculoskeletal: Negative for arthralgias and joint swelling.  Skin: Negative for rash.  Neurological: Negative for dizziness, syncope and headaches.  Hematological: Negative for adenopathy.  Psychiatric/Behavioral: Negative for confusion and dysphoric mood.       Objective:   Physical Exam  Constitutional: He is oriented to person, place, and time. He appears well-developed and well-nourished. No distress.  HENT:  Head: Normocephalic and atraumatic.  Right Ear: External ear normal.  Left Ear: External ear normal.  Mouth/Throat: Oropharynx is clear and moist.  Eyes: Conjunctivae and EOM are normal. Pupils are equal, round, and reactive to light.  Neck: Normal range of motion. Neck supple. No thyromegaly present.  Cardiovascular: Normal rate, regular rhythm and normal heart sounds.   No murmur heard. Pulmonary/Chest: No respiratory distress. He has no wheezes. He has no rales.  Abdominal: Soft. Bowel sounds are normal. He exhibits mass. He exhibits no distension. There is no tenderness. There is no rebound and no guarding.  Abdominal wall hernia which is soft and nontender  Musculoskeletal: He exhibits no edema.  Lymphadenopathy:    He has no cervical adenopathy.  Neurological: He is alert and oriented to person,  place, and time. He displays normal reflexes. No cranial nerve deficit.  Skin: No rash noted.  Psychiatric: He has a normal mood and affect.       Assessment:     Physical exam. Patient has history of colon cancer with no recent concerning symptoms and other stable medical problems as  above. Complaining of bilateral ear congestion with normal exam-question eustachian tube dysfunction    Plan:     -Cannot take flu vaccination secondary to history of Guillain-Barr syndrome -Other immunizations up-to-date -Obtain labs including CEA level -Consider over-the-counter Nasacort AQ for nasal congestive symptoms -Continue to monitor blood pressure closely and be in touch if consistently greater than 830 systolic  Eulas Post MD [facility]

## 2015-11-16 NOTE — Patient Instructions (Signed)
Consider over the counter Nasacort AQ for nasal congestion

## 2015-11-16 NOTE — Progress Notes (Signed)
Pre visit review using our clinic review tool, if applicable. No additional management support is needed unless otherwise documented below in the visit note. 

## 2015-11-17 LAB — CEA: CEA: 4.9 ng/mL — AB

## 2015-12-14 ENCOUNTER — Other Ambulatory Visit: Payer: Self-pay | Admitting: Family Medicine

## 2016-01-18 DIAGNOSIS — Z85828 Personal history of other malignant neoplasm of skin: Secondary | ICD-10-CM | POA: Diagnosis not present

## 2016-01-18 DIAGNOSIS — C44319 Basal cell carcinoma of skin of other parts of face: Secondary | ICD-10-CM | POA: Diagnosis not present

## 2016-01-18 DIAGNOSIS — D485 Neoplasm of uncertain behavior of skin: Secondary | ICD-10-CM | POA: Diagnosis not present

## 2016-01-18 DIAGNOSIS — L820 Inflamed seborrheic keratosis: Secondary | ICD-10-CM | POA: Diagnosis not present

## 2016-01-18 DIAGNOSIS — L570 Actinic keratosis: Secondary | ICD-10-CM | POA: Diagnosis not present

## 2016-01-18 DIAGNOSIS — L821 Other seborrheic keratosis: Secondary | ICD-10-CM | POA: Diagnosis not present

## 2016-01-24 ENCOUNTER — Other Ambulatory Visit: Payer: Self-pay | Admitting: Internal Medicine

## 2016-01-31 ENCOUNTER — Telehealth: Payer: Self-pay | Admitting: Family Medicine

## 2016-01-31 DIAGNOSIS — H6983 Other specified disorders of Eustachian tube, bilateral: Secondary | ICD-10-CM

## 2016-01-31 NOTE — Telephone Encounter (Signed)
Pt was seen last on 11-16-2015 for CPE and this was discussed. Okay to refer.

## 2016-01-31 NOTE — Telephone Encounter (Signed)
° ° °  Pt said he is still having issues with his ears and would like to see a ENT and is asking for a referral

## 2016-01-31 NOTE — Telephone Encounter (Signed)
OK to refer to ENT.

## 2016-02-01 NOTE — Telephone Encounter (Signed)
Order entered for referral.

## 2016-02-03 DIAGNOSIS — J310 Chronic rhinitis: Secondary | ICD-10-CM | POA: Diagnosis not present

## 2016-02-03 DIAGNOSIS — H6993 Unspecified Eustachian tube disorder, bilateral: Secondary | ICD-10-CM | POA: Diagnosis not present

## 2016-02-22 DIAGNOSIS — H6993 Unspecified Eustachian tube disorder, bilateral: Secondary | ICD-10-CM | POA: Diagnosis not present

## 2016-02-22 DIAGNOSIS — H903 Sensorineural hearing loss, bilateral: Secondary | ICD-10-CM | POA: Diagnosis not present

## 2016-02-22 DIAGNOSIS — H9313 Tinnitus, bilateral: Secondary | ICD-10-CM | POA: Diagnosis not present

## 2016-03-13 ENCOUNTER — Other Ambulatory Visit: Payer: Self-pay | Admitting: Family Medicine

## 2016-03-22 ENCOUNTER — Ambulatory Visit (INDEPENDENT_AMBULATORY_CARE_PROVIDER_SITE_OTHER): Payer: Medicare HMO | Admitting: Internal Medicine

## 2016-03-22 ENCOUNTER — Encounter: Payer: Self-pay | Admitting: Internal Medicine

## 2016-03-22 VITALS — BP 128/80 | HR 67 | Wt 188.0 lb

## 2016-03-22 DIAGNOSIS — E039 Hypothyroidism, unspecified: Secondary | ICD-10-CM | POA: Diagnosis not present

## 2016-03-22 LAB — TSH: TSH: 0.01 u[IU]/mL — AB (ref 0.35–4.50)

## 2016-03-22 LAB — T4, FREE: Free T4: 1.53 ng/dL (ref 0.60–1.60)

## 2016-03-22 NOTE — Progress Notes (Signed)
Patient ID: Ronald Gutierrez, male   DOB: May 17, 1926, 83 y.o.   MRN: [id number]   HPI  Ronald Gutierrez is a 83 y.o.-year-old male, returning for f/u for hypothyroidism, dx 2010. Last visit 1 year ago.  Reviewed hx: Pt has poorly controlled hypothyroidism, with previous TSH levels very elevated, as he was taking his LT4 along with OJ and MVI, PPI.   We separated these >> TSH became very suppressed >> we decreased the LT4 dose >> last change was increasing from 125 to 137 mcg daily in 03/2014.  He  takes the Levothyroxine 137 mcg: - fasting - with water - separated by >30 min from b'fast  - + PPIs, multivitamins at 3-4 pm  He is not taking Biotin.  I reviewed pt's thyroid tests: Lab Results  Component Value Date   TSH 4.83 (H) 05/11/2015   TSH 8.76 (H) 03/23/2015   TSH 1.99 11/03/2014   TSH 3.70 09/22/2014   TSH 7.13 (H) 03/17/2014   TSH 8.85 (H) 10/28/2013   TSH 0.009 (L) 09/16/2013   TSH 9.19 (H) 04/22/2013   TSH 69.61 (H) 03/19/2013   TSH 50.92 (H) 12/04/2012   FREET4 1.40 05/11/2015   FREET4 1.38 03/23/2015   FREET4 1.17 09/22/2014   FREET4 1.32 03/17/2014   FREET4 1.13 10/28/2013   FREET4 1.85 (H) 09/16/2013   FREET4 1.52 04/22/2013    Pt denies feeling nodules in neck, hoarseness, dysphagia/odynophagia, SOB with lying down.  Pt denies: - cold intolerance - weight gain - constipation - dry skin - hair falling - depression  At last visit, he was complaining some fatigue with exercise (when playing golf). This has improved.  He also has a history of colon cancer (s/p colectomy in 2010), HL, HTN, GERD.  ROS: Constitutional: no weight gain/loss, no fatigue, no subjective hyperthermia/hypothermia Eyes: no blurry vision, no xerophthalmia ENT: no sore throat, no nodules palpated in throat, no dysphagia/odynophagia, no hoarseness Cardiovascular: no CP/SOB/palpitations/leg swelling Respiratory: no cough/SOB Gastrointestinal: no N/V/D/C Musculoskeletal: no muscle/joint  aches Skin: no rashes Neurological: no tremors/numbness/tingling/dizziness  I reviewed pt's medications, allergies, PMH, social hx, family hx, and changes were documented in the history of present illness. Otherwise, unchanged from my initial visit note.  PE: BP 128/80 (BP Location: Left Arm, Patient Position: Sitting)   Pulse 67   Wt 188 lb (85.3 kg)   SpO2 95%   BMI 29.01 kg/m  Body mass index is 29.01 kg/m.  Wt Readings from Last 3 Encounters:  03/22/16 188 lb (85.3 kg)  11/16/15 183 lb (83 kg)  07/05/15 186 lb (84.4 kg)   Constitutional: slightly overweight, in NAD Eyes: PERRLA, EOMI, no exophthalmos ENT: moist mucous membranes, no thyromegaly, no cervical lymphadenopathy Cardiovascular: RRR, No MRG, + mild periankle swelling Respiratory: CTA B Gastrointestinal: abdomen soft, NT, ND, BS+ Musculoskeletal: no deformities, strength intact in all 4 Skin: moist, warm, no rashes Neurological: no tremor with outstretched hands, DTR undet. in all 4 (h/o Guillain-Barre)  ASSESSMENT: 1. Hypothyroidism - uncontrolled  PLAN:  1. Patient with h/o hypothyroidism, on levothyroxine therapy, with previously fluctuating TSH levels due to taking his medication incorrectly. Now, he is taking it correctly >> TFTs improving. Last TSH was slightly above the ULN, which is not a problem for him due to age. He is feeling great and his previous fatigue with exertion has resolved. He plays golf 3x a week, weather permitting. - We again discussed about correct intake of levothyroxine, fasting, with water, separated by at least 30 minutes from breakfast,  and separated by more than 4 hours from calcium, iron, multivitamins, acid reflux medications (PPIs). He is now taking the LT4 correctly, separated from his MVI and PPI. - He does not appear to have a goiter, thyroid nodules, or neck compression symptoms. He appears euthyroid. - continue 137 mcg LT4  - will check thyroid tests today: TSH, free T4  - I  will see him back in 1 year  Component     Latest Ref Rng & Units 03/22/2016  TSH     0.35 - 4.50 uIU/mL 0.01 (L)  T4,Free(Direct)     0.60 - 1.60 ng/dL 1.53   TFTs indicate over-replacement with LT4 >> I suspect that this is likely after he started to take his medicine correctly and consistently. We decreased the dose to 125 g daily and recheck the labs in 1.5 months.  Philemon Kingdom, MD PhD [facility] Endocrinology

## 2016-03-22 NOTE — Patient Instructions (Signed)
Please stop at the lab.  Please continue Levothyroxine 137 mcg daily.  Take the thyroid hormone every day, with water, at least 30 minutes before breakfast, separated by at least 4 hours from: - acid reflux medications - calcium - iron - multivitamins  Please come back for a follow-up appointment in 1 year.

## 2016-03-23 MED ORDER — LEVOTHYROXINE SODIUM 125 MCG PO TABS: 125.0000 ug | ORAL_TABLET | Freq: Every day | ORAL | 1 refills | Status: DC

## 2016-04-02 ENCOUNTER — Other Ambulatory Visit: Payer: Self-pay | Admitting: Family Medicine

## 2016-04-11 DIAGNOSIS — R69 Illness, unspecified: Secondary | ICD-10-CM | POA: Diagnosis not present

## 2016-05-14 ENCOUNTER — Emergency Department (HOSPITAL_COMMUNITY): Payer: Medicare HMO

## 2016-05-14 ENCOUNTER — Encounter (HOSPITAL_COMMUNITY): Payer: Self-pay | Admitting: Nurse Practitioner

## 2016-05-14 ENCOUNTER — Emergency Department (HOSPITAL_COMMUNITY)
Admission: EM | Admit: 2016-05-14 | Discharge: 2016-05-15 | Disposition: A | Discharge status: Home / Self Care | Payer: Medicare HMO | Attending: Emergency Medicine | Admitting: Emergency Medicine

## 2016-05-14 DIAGNOSIS — R0781 Pleurodynia: Secondary | ICD-10-CM | POA: Diagnosis not present

## 2016-05-14 DIAGNOSIS — Y999 Unspecified external cause status: Secondary | ICD-10-CM | POA: Insufficient documentation

## 2016-05-14 DIAGNOSIS — S2231XA Fracture of one rib, right side, initial encounter for closed fracture: Secondary | ICD-10-CM | POA: Diagnosis not present

## 2016-05-14 DIAGNOSIS — S2232XA Fracture of one rib, left side, initial encounter for closed fracture: Secondary | ICD-10-CM | POA: Diagnosis not present

## 2016-05-14 DIAGNOSIS — Y939 Activity, unspecified: Secondary | ICD-10-CM | POA: Diagnosis not present

## 2016-05-14 DIAGNOSIS — E039 Hypothyroidism, unspecified: Secondary | ICD-10-CM | POA: Diagnosis not present

## 2016-05-14 DIAGNOSIS — W010XXA Fall on same level from slipping, tripping and stumbling without subsequent striking against object, initial encounter: Secondary | ICD-10-CM | POA: Diagnosis not present

## 2016-05-14 DIAGNOSIS — Z85038 Personal history of other malignant neoplasm of large intestine: Secondary | ICD-10-CM | POA: Insufficient documentation

## 2016-05-14 DIAGNOSIS — S279XXA Injury of unspecified intrathoracic organ, initial encounter: Secondary | ICD-10-CM | POA: Diagnosis not present

## 2016-05-14 DIAGNOSIS — I10 Essential (primary) hypertension: Secondary | ICD-10-CM | POA: Diagnosis not present

## 2016-05-14 DIAGNOSIS — S299XXA Unspecified injury of thorax, initial encounter: Secondary | ICD-10-CM | POA: Diagnosis not present

## 2016-05-14 DIAGNOSIS — Z7982 Long term (current) use of aspirin: Secondary | ICD-10-CM | POA: Insufficient documentation

## 2016-05-14 DIAGNOSIS — Z87891 Personal history of nicotine dependence: Secondary | ICD-10-CM | POA: Insufficient documentation

## 2016-05-14 DIAGNOSIS — Y92009 Unspecified place in unspecified non-institutional (private) residence as the place of occurrence of the external cause: Secondary | ICD-10-CM | POA: Insufficient documentation

## 2016-05-14 MED ORDER — FENTANYL CITRATE (PF) 100 MCG/2ML IJ SOLN
50.0000 ug | Freq: Once | INTRAMUSCULAR | Status: AC
  Administered 2016-05-14: 50 ug via INTRAVENOUS
  Filled 2016-05-14: qty 2

## 2016-05-14 NOTE — ED Provider Notes (Signed)
[facility] DEPT Provider Note   CSN: [id number] Arrival date & time: 05/14/16  2113     History   Chief Complaint Chief Complaint  Patient presents with  . Fall  . Rib Injury    Left Rib Pain    HPI Ronald Gutierrez is a 83 y.o. male.  HPI   Pt is a 83 yo male with PMH of anemia, HTN, HLD, GERD, hypothyroidism who presents to the ED via EMS from home with reported witnessed mechanical fall. Pt reports he opened the door to talk to his wife outside when the rug slipped out from under him resulting in him landing on top of a steel planter on his left side. Denies head injury or LOC. Reports associated pain to his left lower ribs which he notes is significantly worsened with movement or palpation. Denies HA, lightheadedness, dizziness, visual changes, neck/back pain, CP, SOB, cough, abdominal pain, N/V, numbness, tingling, weakness. Denies use of anticoagulants. Pt reports having intermittent relief of pain s/p pain meds administered via EMS PTA.   Past Medical History:  Diagnosis Date  . ANEMIA   . Colon adenocarcinoma ([place]) 05/2008   T4, N0  . Diverticulitis   . FATIGUE 05/13/2009  . GERD 05/25/2008  . Guillain-Barre syndrome ([place])   . HYPERLIPIDEMIA 05/20/2008  . HYPERTENSION 05/20/2008  . Hypothyroidism   . KERATOSIS 10/21/2008  . Tubular adenoma of colon 10/2009    Patient Active Problem List   Diagnosis Date Noted  . History of Guillain-Barre syndrome 12/03/2013  . History of malignant neoplasm of large intestine 09/22/2009  . KERATOSIS 10/21/2008  . 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 GERD 05/25/2008  . PERSONAL HX COLONIC POLYPS 05/25/2008  . Hypothyroidism 05/20/2008  . Hyperlipidemia 05/20/2008  . ANEMIA 05/20/2008  . Essential hypertension 05/20/2008    Past Surgical History:  Procedure Laterality Date  . APPENDECTOMY  1948  . CATARACT EXTRACTION    . CERVICAL LAMINECTOMY  1968  . COLECTOMY  07/2008   transverse  . HEMORRHOID SURGERY    .  HERNIA REPAIR     inguinal  . LUMBAR LAMINECTOMY  1970  . OMENTECTOMY         Home Medications    Prior to Admission medications   Medication Sig Start Date End Date Taking? Authorizing Provider  aspirin 81 MG tablet Take 81 mg by mouth daily.   Yes Historical Provider, MD  levothyroxine (SYNTHROID, LEVOTHROID) 125 MCG tablet Take 1 tablet (125 mcg total) by mouth daily before breakfast. 03/23/16  Yes Philemon Kingdom, MD  lisinopril (PRINIVIL,ZESTRIL) 40 MG tablet TAKE 1 TABLET ONCE DAILY. Patient taking differently: TAKE 40 MG BY MOUTH ONCE DAILY. 04/03/16  Yes Eulas Post, MD  loratadine (CLARITIN) 10 MG tablet Take 10 mg by mouth daily.     Yes Historical Provider, MD  Multiple Vitamins-Minerals (CENTRUM ULTRA MENS PO) Take 1 tablet by mouth daily.     Yes Historical Provider, MD  omeprazole (PRILOSEC) 20 MG capsule TAKE (1) CAPSULE DAILY. Patient taking differently: TAKE 20 MG BY MOUTH ONCE DAILY. 03/13/16  Yes Eulas Post, MD  simvastatin (ZOCOR) 40 MG tablet TAKE (1/2) TABLET DAILY. Patient taking differently: TAKE 20 MG BY MOUTH DAILY. 07/07/15  Yes Eulas Post, MD  oxyCODONE-acetaminophen (PERCOCET/ROXICET) 5-325 MG tablet Take 1 tablet by mouth every 4 (four) hours as needed. 05/15/16   Nona Dell, PA-C    Family History Family History  Problem Relation Age of Onset  .  Lung cancer Brother   . Heart disease Brother   . Prostate cancer Brother   . Stroke Father     Social History Social History  Substance Use Topics  . Smoking status: Former Smoker    Packs/day: 1.50    Years: 15.00    Types: Cigarettes    Quit date: 01/17/1968  . Smokeless tobacco: Never Used  . Alcohol use Yes     Allergies   Ferrous sulfate   Review of Systems Review of Systems  Cardiovascular: Positive for chest pain (left rib pain).  All other systems reviewed and are negative.    Physical Exam Updated Vital Signs BP 134/67 (BP Location: Right Arm)    Pulse 63   Temp 97.9 F (36.6 C) (Oral)   Resp 14   Ht '5\' 9"'$  (1.753 m)   Wt 83.9 kg   SpO2 94%   BMI 27.32 kg/m   Physical Exam  Constitutional: He is oriented to person, place, and time. He appears well-developed and well-nourished. No distress.  HENT:  Head: Normocephalic and atraumatic. Head is without raccoon's eyes, without Battle's sign, without abrasion and without laceration.  Right Ear: Tympanic membrane normal. No hemotympanum.  Left Ear: Tympanic membrane normal. No hemotympanum.  Nose: Nose normal. No sinus tenderness, nasal deformity, septal deviation or nasal septal hematoma. No epistaxis.  Mouth/Throat: Uvula is midline, oropharynx is clear and moist and mucous membranes are normal. No oropharyngeal exudate, posterior oropharyngeal edema, posterior oropharyngeal erythema or tonsillar abscesses.  Eyes: Conjunctivae and EOM are normal. Pupils are equal, round, and reactive to light. Right eye exhibits no discharge. Left eye exhibits no discharge. No scleral icterus.  Neck: Normal range of motion. Neck supple.  Cardiovascular: Normal rate, regular rhythm, normal heart sounds and intact distal pulses.   Pulmonary/Chest: Effort normal and breath sounds normal. No respiratory distress. He has no wheezes. He has no rales. He exhibits tenderness (left anterior lower ribs with exquisite TTP). He exhibits no laceration, no crepitus, no edema, no deformity, no swelling and no retraction.  Abdominal: Soft. Bowel sounds are normal. He exhibits no distension and no mass. There is no tenderness. There is no rebound and no guarding. No hernia.  Musculoskeletal: Normal range of motion. He exhibits no edema, tenderness or deformity.  No cervical, thoracic, or lumbar spine midline TTP. Full ROM of bilateral upper and lower extremities with 5/5 strength.   2+ radial and PT pulses. Sensation grossly intact.   Neurological: He is alert and oriented to person, place, and time. He has normal  strength. No cranial nerve deficit or sensory deficit. Coordination and gait normal.  Skin: Skin is warm and dry. He is not diaphoretic.  Nursing note and vitals reviewed.    ED Treatments / Results  Labs (all labs ordered are listed, but only abnormal results are displayed) Labs Reviewed - No data to display  EKG  EKG Interpretation None       Radiology Dg Ribs Unilateral W/chest Left  Result Date: 05/14/2016 CLINICAL DATA:  Mechanical fall at home, trip and fall onto concrete flower pot. LEFT lower rib pain. History of colon cancer. EXAM: LEFT RIBS AND CHEST - 3+ VIEW COMPARISON:  CT chest March 03, 2009 FINDINGS: Cardiac silhouette is mild to moderately enlarged even with consideration to AP technique. Mediastinal silhouette is nonsuspicious. Large hiatal hernia. Diffuse interstitial prominence without pleural effusion or focal consolidation. No pneumothorax. Slight cortical buckling of the lateral LEFT seventh rib. IMPRESSION: Suspected nondisplaced LEFT seventh rib  fracture. Moderate cardiomegaly. Diffuse interstitial prominence could be chronic, associated with pulmonary edema and atypical infection without focal consolidation. Electronically Signed   By: Elon Alas M.D.   On: 05/14/2016 22:41    Procedures Procedures (including critical care time)  Medications Ordered in ED Medications  fentaNYL (SUBLIMAZE) injection 50 mcg (50 mcg Intravenous Given 05/14/16 2216)  fentaNYL (SUBLIMAZE) injection 50 mcg (50 mcg Intravenous Given 05/15/16 0021)     Initial Impression / Assessment and Plan / ED Course  I have reviewed the triage vital signs and the nursing notes.  Pertinent labs & imaging results that were available during my care of the patient were reviewed by me and considered in my medical decision making (see chart for details).     Patient presents with left rib pain after having a mechanical fall and landing on a steel Planters box on his left side. Denies  head injury or LOC. Denies use of anticoagulants. VSS. Exam revealed exquisite tenderness over left anterior lower rib, no step-off or deformity palpated. Lungs clear to auscultation bilaterally. Remaining exam unremarkable. No evidence of head injury. No neuro deficits. Left rib x-ray revealed nondisplaced left seventh rib fracture. Patient given pain meds in the ED with improvement of pain. Patient discharged home with pain meds, incentive spirometer and symptomatically treatment. Advised patient to follow-up with his PCP this week for follow-up evaluation. Discussed return precautions.  Final Clinical Impressions(s) / ED Diagnoses   Final diagnoses:  Closed fracture of one rib of left side, initial encounter    New Prescriptions Discharge Medication List as of 05/15/2016 12:04 AM    START taking these medications   Details  oxyCODONE-acetaminophen (PERCOCET/ROXICET) 5-325 MG tablet Take 1 tablet by mouth every 4 (four) hours as needed., Starting Mon 05/15/2016, Print         [address] [place], [place] 05/15/16 [place], MD 05/15/16 [phone]

## 2016-05-14 NOTE — ED Triage Notes (Signed)
Pt is presented from home post a mechanical fall, states he tripped on a door step entrance to his house and landed on his left rib on concrete flower pot/vace. Adds that he heard "crack-snap sound on the said left rib." Endorses taking two manhattans (2 shots of vodka), AOx4 without obvious signs of intoxication. Denies being on anticoagulants.

## 2016-05-14 NOTE — ED Notes (Signed)
Bed: WA02 Expected date:  Expected time:  Means of arrival:  Comments: EMS 83 yo male fall while walking through front door-hit ribs on a pot

## 2016-05-15 DIAGNOSIS — W010XXA Fall on same level from slipping, tripping and stumbling without subsequent striking against object, initial encounter: Secondary | ICD-10-CM | POA: Diagnosis not present

## 2016-05-15 DIAGNOSIS — Z87891 Personal history of nicotine dependence: Secondary | ICD-10-CM | POA: Diagnosis not present

## 2016-05-15 DIAGNOSIS — I10 Essential (primary) hypertension: Secondary | ICD-10-CM | POA: Diagnosis not present

## 2016-05-15 DIAGNOSIS — Z85038 Personal history of other malignant neoplasm of large intestine: Secondary | ICD-10-CM | POA: Diagnosis not present

## 2016-05-15 DIAGNOSIS — Z7982 Long term (current) use of aspirin: Secondary | ICD-10-CM | POA: Diagnosis not present

## 2016-05-15 DIAGNOSIS — E039 Hypothyroidism, unspecified: Secondary | ICD-10-CM | POA: Diagnosis not present

## 2016-05-15 DIAGNOSIS — S2231XA Fracture of one rib, right side, initial encounter for closed fracture: Secondary | ICD-10-CM | POA: Diagnosis not present

## 2016-05-15 MED ORDER — FENTANYL CITRATE (PF) 100 MCG/2ML IJ SOLN
50.0000 ug | Freq: Once | INTRAMUSCULAR | Status: AC
  Administered 2016-05-15: 50 ug via INTRAVENOUS
  Filled 2016-05-15: qty 2

## 2016-05-15 MED ORDER — OXYCODONE-ACETAMINOPHEN 5-325 MG PO TABS: 1.0000 | ORAL_TABLET | ORAL | 0 refills | Status: DC | PRN

## 2016-05-15 NOTE — Discharge Instructions (Signed)
Take your medication as prescribed as needed for pain relief. You may also take '600mg'$  Ibuprofen every 6 hours as needed for additional relief. I recommend using your incentive spirometer 4-5 times daily for the next 1-2 weeks.  Follow up with your primary care provider in 3-4 days for follow up evaluation and further management of your pain for your rib fracture.  Please return to the Emergency Department if symptoms worsen or new onset of fever, chest pain, difficulty breathing, coughing up blood, vomiting.

## 2016-05-17 ENCOUNTER — Encounter: Payer: Self-pay | Admitting: Family Medicine

## 2016-05-17 ENCOUNTER — Ambulatory Visit (INDEPENDENT_AMBULATORY_CARE_PROVIDER_SITE_OTHER): Payer: Medicare HMO | Admitting: Family Medicine

## 2016-05-17 VITALS — BP 110/70 | HR 66 | Temp 97.9°F | Wt 185.3 lb

## 2016-05-17 DIAGNOSIS — S2232XA Fracture of one rib, left side, initial encounter for closed fracture: Secondary | ICD-10-CM | POA: Diagnosis not present

## 2016-05-17 DIAGNOSIS — S20212A Contusion of left front wall of thorax, initial encounter: Secondary | ICD-10-CM

## 2016-05-17 NOTE — Progress Notes (Signed)
Subjective:     Patient ID: Ronald Gutierrez, male   DOB: 10/04/1926, 83 y.o.   MRN: [id number]  HPI Patient seen for emergency room follow-up. He fell a couple days ago.  This occurred on 05/15/16 at home. He was walking and had been on a rug and thinks that he slipped on some tile as he was walking from the rug but fell forward suddenly and landed on a ceramic flower pot which broke into multiple pieces. There was no loss of consciousness. He was aware of pain left lower rib cage area and felt a "pop "sensation several times as they were trying to move him. Never had any loss of consciousness. No bruising of the head region.  Denied any headache, dizziness, chest pain, dyspnea, nausea or vomiting, confusion. Does not use any anticoagulants.  Patient was given IV pain medications which helped temporarily. X-rays revealed probable nondisplaced left seventh rib fracture. No other acute injuries. He was prescribed oxycodone but never got prescription filled. Is currently taking Tylenol up to 3 tablets every 4-6 hours as needed for pain. His wife has cautioned him to not take more than 2. He is using incentive spirometer regularly. He requested a rib belt or wrap but was advised against this from the ER. Patient states he had fairly severe pain or 4 AM but currently well controlled after taking couple Tylenol this morning. Rare cough. No hemoptysis. No fevers or chills.  Past Medical History:  Diagnosis Date  . ANEMIA   . Colon adenocarcinoma ([facility]) 05/2008   T4, N0  . Diverticulitis   . FATIGUE 05/13/2009  . GERD 05/25/2008  . Guillain-Barre syndrome ([place])   . HYPERLIPIDEMIA 05/20/2008  . HYPERTENSION 05/20/2008  . Hypothyroidism   . KERATOSIS 10/21/2008  . Tubular adenoma of colon 10/2009   Past Surgical History:  Procedure Laterality Date  . APPENDECTOMY  1948  . CATARACT EXTRACTION    . CERVICAL LAMINECTOMY  1968  . COLECTOMY  07/2008   transverse  . HEMORRHOID SURGERY    . HERNIA REPAIR     inguinal   . LUMBAR LAMINECTOMY  1970  . OMENTECTOMY      reports that he quit smoking about 48 years ago. His smoking use included Cigarettes. He has a 22.50 pack-year smoking history. He has never used smokeless tobacco. He reports that he drinks alcohol. He reports that he does not use drugs. family history includes Heart disease in his brother; Lung cancer in his brother; Prostate cancer in his brother; Stroke in his father. Allergies  Allergen Reactions  . Ferrous Sulfate     REACTION: Stomach upset     Review of Systems  Constitutional: Negative for chills and fever.  Respiratory: Negative for shortness of breath and wheezing.   Cardiovascular: Negative for chest pain and palpitations.  Gastrointestinal: Negative for abdominal pain, nausea and vomiting.  Neurological: Negative for dizziness, seizures, syncope and headaches.  Psychiatric/Behavioral: Negative for confusion.       Objective:   Physical Exam  Constitutional: He is oriented to person, place, and time. He appears well-developed and well-nourished.  HENT:  Head: Normocephalic and atraumatic.  Neck: Neck supple.  Cardiovascular: Normal rate and regular rhythm.   Pulmonary/Chest: Effort normal and breath sounds normal. No respiratory distress. He has no wheezes. He has no rales.  Only minimal bruising visible left lower rib cage area. Very tender to palpation around the seventh to ninth rib region  Musculoskeletal: He exhibits no edema.  Neurological: He is  alert and oriented to person, place, and time.       Assessment:     Recent fall with probable left seventh rib fracture    Plan:     -He is advised not to exceed 2 Tylenol every 6 hours. -Cautious use of ibuprofen to supplement given his age. -He was advised to consider getting oxycodone prescription filled but he is very reluctant to take any kind of opioid. -We advised against any, rib belt or wrap because of increased risk of pneumonia at his age -Continue  with incentive spirometer several times daily -Follow-up immediately for any fever or increasing shortness of breath -Reassess in 2 weeks and sooner as needed -no golf for several weeks.  Eulas Post MD [facility] at [facility]

## 2016-05-17 NOTE — Patient Instructions (Signed)
Rib Fracture A rib fracture is a break or crack in one of the bones of the ribs. The ribs are a group of long, curved bones that wrap around your chest and attach to your spine. They protect your lungs and other organs in the chest cavity. A broken or cracked rib is often painful, but most do not cause other problems. Most rib fractures heal on their own over time. However, rib fractures can be more serious if multiple ribs are broken or if broken ribs move out of place and push against other structures. What are the causes?  A direct blow to the chest. For example, this could happen during contact sports, a car accident, or a fall against a hard object.  Repetitive movements with high force, such as pitching a baseball or having severe coughing spells. What are the signs or symptoms?  Pain when you breathe in or cough.  Pain when someone presses on the injured area. How is this diagnosed? Your caregiver will perform a physical exam. Various imaging tests may be ordered to confirm the diagnosis and to look for related injuries. These tests may include a chest X-ray, computed tomography (CT), magnetic resonance imaging (MRI), or a bone scan. How is this treated? Rib fractures usually heal on their own in 1-3 months. The longer healing period is often associated with a continued cough or other aggravating activities. During the healing period, pain control is very important. Medication is usually given to control pain. Hospitalization or surgery may be needed for more severe injuries, such as those in which multiple ribs are broken or the ribs have moved out of place. Follow these instructions at home:  Avoid strenuous activity and any activities or movements that cause pain. Be careful during activities and avoid bumping the injured rib.  Gradually increase activity as directed by your caregiver.  Only take over-the-counter or prescription medications as directed by your caregiver. Do not take  other medications without asking your caregiver first.  Apply ice to the injured area for the first 1-2 days after you have been treated or as directed by your caregiver. Applying ice helps to reduce inflammation and pain.  Put ice in a plastic bag.  Place a towel between your skin and the bag.  Leave the ice on for 15-20 minutes at a time, every 2 hours while you are awake.  Perform deep breathing as directed by your caregiver. This will help prevent pneumonia, which is a common complication of a broken rib. Your caregiver may instruct you to:  Take deep breaths several times a day.  Try to cough several times a day, holding a pillow against the injured area.  Use a device called an incentive spirometer to practice deep breathing several times a day.  Drink enough fluids to keep your urine clear or pale yellow. This will help you avoid constipation.  Do not wear a rib belt or binder. These restrict breathing, which can lead to pneumonia. Get help right away if:  You have a fever.  You have difficulty breathing or shortness of breath.  You develop a continual cough, or you cough up thick or bloody sputum.  You feel sick to your stomach (nausea), throw up (vomit), or have abdominal pain.  You have worsening pain not controlled with medications. This information is not intended to replace advice given to you by your health care provider. Make sure you discuss any questions you have with your health care provider. Document Released: 01/02/2005 Document  Revised: 06/10/2015 Document Reviewed: 03/06/2012 Elsevier Interactive Patient Education  2017 [place] NOT take more than two Tylenol every 6 hours May supplement with occasional Ibuprofen but take with food.

## 2016-05-17 NOTE — Progress Notes (Signed)
Pre visit review using our clinic review tool, if applicable. No additional management support is needed unless otherwise documented below in the visit note. 

## 2016-05-31 ENCOUNTER — Encounter: Payer: Self-pay | Admitting: Family Medicine

## 2016-05-31 ENCOUNTER — Ambulatory Visit (INDEPENDENT_AMBULATORY_CARE_PROVIDER_SITE_OTHER): Payer: Medicare HMO | Admitting: Family Medicine

## 2016-05-31 VITALS — BP 120/76 | HR 80 | Temp 97.7°F | Wt 188.6 lb

## 2016-05-31 DIAGNOSIS — S2232XD Fracture of one rib, left side, subsequent encounter for fracture with routine healing: Secondary | ICD-10-CM | POA: Diagnosis not present

## 2016-05-31 NOTE — Progress Notes (Signed)
Subjective:     Patient ID: Ronald Gutierrez, male   DOB: [birth year], 83 y.o.   MRN: [id number]  HPI Patient seen for follow-up regarding left seventh anterior rib fracture. He had fall April 30. He's been avoiding binders. Using Tylenol as the for pain. His pain was much improved last week but then over the weekend had some recurrence with pain radiating toward the back. No rash. He is sleeping fairly well. Still using incentive spirometer daily. No cough. No fever. Pain is moderate.  Never filled prescription for oxycodone  Past Medical History:  Diagnosis Date  . ANEMIA   . Colon adenocarcinoma ([place]) 05/2008   T4, N0  . Diverticulitis   . FATIGUE 05/13/2009  . GERD 05/25/2008  . Guillain-Barre syndrome ([place])   . HYPERLIPIDEMIA 05/20/2008  . HYPERTENSION 05/20/2008  . Hypothyroidism   . KERATOSIS 10/21/2008  . Tubular adenoma of colon 10/2009   Past Surgical History:  Procedure Laterality Date  . APPENDECTOMY  1948  . CATARACT EXTRACTION    . CERVICAL LAMINECTOMY  1968  . COLECTOMY  07/2008   transverse  . HEMORRHOID SURGERY    . HERNIA REPAIR     inguinal  . LUMBAR LAMINECTOMY  1970  . OMENTECTOMY      reports that he quit smoking about 48 years ago. His smoking use included Cigarettes. He has a 22.50 pack-year smoking history. He has never used smokeless tobacco. He reports that he drinks alcohol. He reports that he does not use drugs. family history includes Heart disease in his brother; Lung cancer in his brother; Prostate cancer in his brother; Stroke in his father. Allergies  Allergen Reactions  . Ferrous Sulfate     REACTION: Stomach upset     Review of Systems  Constitutional: Negative for chills and fever.  Respiratory: Negative for cough.        Objective:   Physical Exam  Constitutional: He appears well-developed and well-nourished.  Cardiovascular: Normal rate and regular rhythm.   Pulmonary/Chest: Effort normal and breath sounds normal. No respiratory distress. He  has no wheezes. He has no rales.  Still some tenderness around the seventh to eigth left anterior ribs.  Skin: No rash noted.       Assessment:     Closed fracture left anterior seventh rib slowly improving    Plan:     -Continue Tylenol as needed for pain -Continue incentive spirometer -We recommended no golf for at least another 4-6 weeks. He may try some putting  Eulas Post MD [facility] at [facility]

## 2016-05-31 NOTE — Patient Instructions (Signed)
Rib Fracture A rib fracture is a break or crack in one of the bones of the ribs. The ribs are a group of long, curved bones that wrap around your chest and attach to your spine. They protect your lungs and other organs in the chest cavity. A broken or cracked rib is often painful, but most do not cause other problems. Most rib fractures heal on their own over time. However, rib fractures can be more serious if multiple ribs are broken or if broken ribs move out of place and push against other structures. What are the causes?  A direct blow to the chest. For example, this could happen during contact sports, a car accident, or a fall against a hard object.  Repetitive movements with high force, such as pitching a baseball or having severe coughing spells. What are the signs or symptoms?  Pain when you breathe in or cough.  Pain when someone presses on the injured area. How is this diagnosed? Your caregiver will perform a physical exam. Various imaging tests may be ordered to confirm the diagnosis and to look for related injuries. These tests may include a chest X-ray, computed tomography (CT), magnetic resonance imaging (MRI), or a bone scan. How is this treated? Rib fractures usually heal on their own in 1-3 months. The longer healing period is often associated with a continued cough or other aggravating activities. During the healing period, pain control is very important. Medication is usually given to control pain. Hospitalization or surgery may be needed for more severe injuries, such as those in which multiple ribs are broken or the ribs have moved out of place. Follow these instructions at home:  Avoid strenuous activity and any activities or movements that cause pain. Be careful during activities and avoid bumping the injured rib.  Gradually increase activity as directed by your caregiver.  Only take over-the-counter or prescription medications as directed by your caregiver. Do not take  other medications without asking your caregiver first.  Apply ice to the injured area for the first 1-2 days after you have been treated or as directed by your caregiver. Applying ice helps to reduce inflammation and pain.  Put ice in a plastic bag.  Place a towel between your skin and the bag.  Leave the ice on for 15-20 minutes at a time, every 2 hours while you are awake.  Perform deep breathing as directed by your caregiver. This will help prevent pneumonia, which is a common complication of a broken rib. Your caregiver may instruct you to:  Take deep breaths several times a day.  Try to cough several times a day, holding a pillow against the injured area.  Use a device called an incentive spirometer to practice deep breathing several times a day.  Drink enough fluids to keep your urine clear or pale yellow. This will help you avoid constipation.  Do not wear a rib belt or binder. These restrict breathing, which can lead to pneumonia. Get help right away if:  You have a fever.  You have difficulty breathing or shortness of breath.  You develop a continual cough, or you cough up thick or bloody sputum.  You feel sick to your stomach (nausea), throw up (vomit), or have abdominal pain.  You have worsening pain not controlled with medications. This information is not intended to replace advice given to you by your health care provider. Make sure you discuss any questions you have with your health care provider. Document Released: 01/02/2005 Document  Revised: 06/10/2015 Document Reviewed: 03/06/2012 Elsevier Interactive Patient Education  2017 [employer].

## 2016-06-18 ENCOUNTER — Other Ambulatory Visit: Payer: Self-pay | Admitting: Family Medicine

## 2016-06-18 ENCOUNTER — Other Ambulatory Visit: Payer: Self-pay | Admitting: Internal Medicine

## 2016-06-20 ENCOUNTER — Ambulatory Visit: Payer: Medicare HMO

## 2016-09-07 ENCOUNTER — Other Ambulatory Visit: Payer: Self-pay

## 2016-09-07 MED ORDER — LEVOTHYROXINE SODIUM 125 MCG PO TABS: ORAL_TABLET | ORAL | 0 refills | Status: DC

## 2016-09-08 ENCOUNTER — Other Ambulatory Visit: Payer: Self-pay | Admitting: *Deleted

## 2016-09-08 ENCOUNTER — Other Ambulatory Visit: Payer: Self-pay

## 2016-09-08 MED ORDER — SIMVASTATIN 40 MG PO TABS: ORAL_TABLET | ORAL | 1 refills | Status: DC

## 2016-09-08 MED ORDER — LEVOTHYROXINE SODIUM 125 MCG PO TABS: ORAL_TABLET | ORAL | 0 refills | Status: DC

## 2016-09-08 MED ORDER — OMEPRAZOLE 20 MG PO CPDR: DELAYED_RELEASE_CAPSULE | ORAL | 0 refills | Status: DC

## 2016-09-08 MED ORDER — LISINOPRIL 40 MG PO TABS: 40.0000 mg | ORAL_TABLET | Freq: Every day | ORAL | 0 refills | Status: DC

## 2016-09-11 ENCOUNTER — Other Ambulatory Visit: Payer: Self-pay

## 2016-09-11 MED ORDER — LEVOTHYROXINE SODIUM 125 MCG PO TABS: ORAL_TABLET | ORAL | 0 refills | Status: DC

## 2016-10-03 DIAGNOSIS — D1721 Benign lipomatous neoplasm of skin and subcutaneous tissue of right arm: Secondary | ICD-10-CM | POA: Diagnosis not present

## 2016-10-03 DIAGNOSIS — D1722 Benign lipomatous neoplasm of skin and subcutaneous tissue of left arm: Secondary | ICD-10-CM | POA: Diagnosis not present

## 2016-10-03 DIAGNOSIS — L570 Actinic keratosis: Secondary | ICD-10-CM | POA: Diagnosis not present

## 2016-10-03 DIAGNOSIS — Z85828 Personal history of other malignant neoplasm of skin: Secondary | ICD-10-CM | POA: Diagnosis not present

## 2016-10-03 DIAGNOSIS — L821 Other seborrheic keratosis: Secondary | ICD-10-CM | POA: Diagnosis not present

## 2016-10-03 NOTE — Progress Notes (Addendum)
Subjective:   Ronald Gutierrez is a 83 y.o. male who presents for Medicare Annual/Subsequent preventive examination.  The Patient was informed that the wellness visit is to identify future health risk and educate and initiate measures that can reduce risk for increased disease through the lifespan.    Annual Wellness Assessment  Reports health as good   Preventive Screening -Counseling & Management  Medicare Annual Preventive Care Visit - Subsequent Last OV may 16; rib fx secondary to fall  PSA 10/2014  HX colon cancer; Guillain Barre 2010 - no flu vaccine Had just started a new job; quality control  Residual no issues now  Notes Colonoscopy Due- last one was 12/2011 Will not have another colonoscopy Postponed x 1 year, patient watches blood and as long as he is stable, then he will not need another    VS reviewed;   Diet  Ensure Wife cooks Eats healthy   BMI 27   Exercise Has difficulty with balance; shuffling in the am Falls in the last year; one; Plays golf Monday and Thursday  Falls now ; will see Dr. Elease Hashimoto for not picking feet up     Stressors: none  Sleep patterns: sleeps well  Pain? no    Cardiac Risk Factors Addressed Hyperlipidemia - chol 155; hdl 72; LDL 60; trig 114    Advanced Directives Advanced Directive; Reviewed advanced directive and agreed to receipt of information and discussion.  Focused face to face x  20 minutes discussing HCPOA and Living will and reviewed all the questions in the [place] forms. The patient voices understanding of HCPOA; LW reviewed and information provided on each question. Educated on how to revoke this HCPOA or LW at any time.   Also  discussed life prolonging measures (given a few examples) and where he could choose to initiate or not;  the ability to given the HCPOA power to change his living will or not if he cannot speak for himself; as well as finalizing the will by 2 unrelated witnesses and notary.  Will  call for questions and given information on [facility] pastoral department for further assistance.      Patient Care Team: Eulas Post, MD as PCP - General Dermatologist Dr. Ronnald Ramp      Cardiac Risk Factors include: advanced age (>79men, >18 women);dyslipidemia;hypertension;male gender     Objective:    Vitals: BP 118/80   Pulse 96   Ht 5\' 9"  (1.753 m)   Wt 187 lb (84.8 kg)   SpO2 (!) 52%   BMI 27.62 kg/m   Body mass index is 27.62 kg/m.  Tobacco History  Smoking Status  . Former Smoker  . Packs/day: 1.50  . Years: 15.00  . Types: Cigarettes  . Quit date: 01/17/1968  Smokeless Tobacco  . Never Used    Comment: aged out of preventive screens     Counseling given: Yes   Past Medical History:  Diagnosis Date  . ANEMIA   . Colon adenocarcinoma ([place]) 05/2008   T4, N0  . Diverticulitis   . FATIGUE 05/13/2009  . GERD 05/25/2008  . Guillain-Barre syndrome ([place])   . HYPERLIPIDEMIA 05/20/2008  . HYPERTENSION 05/20/2008  . Hypothyroidism   . KERATOSIS 10/21/2008  . Tubular adenoma of colon 10/2009   Past Surgical History:  Procedure Laterality Date  . APPENDECTOMY  1948  . CATARACT EXTRACTION    . CERVICAL LAMINECTOMY  1968  . COLECTOMY  07/2008   transverse  . HEMORRHOID SURGERY    .  HERNIA REPAIR     inguinal  . LUMBAR LAMINECTOMY  1970  . OMENTECTOMY     Family History  Problem Relation Age of Onset  . Lung cancer Brother   . Heart disease Brother   . Prostate cancer Brother   . Stroke Father    History  Sexual Activity  . Sexual activity: Not on file    Outpatient Encounter Prescriptions as of 10/04/2016  Medication Sig  . aspirin 81 MG tablet Take 81 mg by mouth daily.  Marland Kitchen levothyroxine (SYNTHROID, LEVOTHROID) 125 MCG tablet TAKE 1 TABLET ONCE DAILY BEFORE BREAKFAST.  Marland Kitchen lisinopril (PRINIVIL,ZESTRIL) 40 MG tablet Take 1 tablet (40 mg total) by mouth daily.  Marland Kitchen loratadine (CLARITIN) 10 MG tablet Take 10 mg by mouth daily.    . Multiple  Vitamins-Minerals (CENTRUM ULTRA MENS PO) Take 1 tablet by mouth daily.    Marland Kitchen omeprazole (PRILOSEC) 20 MG capsule TAKE (1) CAPSULE DAILY.  . simvastatin (ZOCOR) 40 MG tablet TAKE (1/2) TABLET DAILY.  Marland Kitchen oxyCODONE-acetaminophen (PERCOCET/ROXICET) 5-325 MG tablet Take 1 tablet by mouth every 4 (four) hours as needed. (Patient not taking: Reported on 05/17/2016)   No facility-administered encounter medications on file as of 10/04/2016.     Activities of Daily Living In your present state of health, do you have any difficulty performing the following activities: 10/04/2016  Hearing? N  Vision? N  Difficulty concentrating or making decisions? N  Walking or climbing stairs? N  Dressing or bathing? N  Doing errands, shopping? N  Preparing Food and eating ? N  Using the Toilet? N  In the past six months, have you accidently leaked urine? Y  Comment followed by [facility]  Do you have problems with loss of bowel control? N  Managing your Medications? N  Managing your Finances? N  Housekeeping or managing your Housekeeping? N  Some recent data might be hidden    Patient Care Team: Eulas Post, MD as PCP - General   Assessment:     Exercise Activities and Dietary recommendations Current Exercise Habits: Structured exercise class  Goals    . Exercise 150 minutes per week (moderate activity)          Will continue to play golf! Stay active       Fall Risk Fall Risk  10/04/2016 05/31/2016 05/31/2016 05/17/2016 11/10/2014  Falls in the past year? Yes Yes Yes Yes No  Number falls in past yr: 1 1 1 1  -  Injury with Fall? - Yes Yes Yes -  Risk for fall due to : Other (Comment) - - - -  Risk for fall due to: Comment states he is stumbling more; to make apt with Dr. Elease Hashimoto - - - -  Follow up Education provided - - - -   Depression Screen PHQ 2/9 Scores 10/04/2016 05/31/2016 01/06/2015 11/10/2014  PHQ - 2 Score 0 0 0 0    Cognitive Function MMSE - Mini Mental State Exam 10/04/2016  Not  completed: (No Data)     Ad8 score reviewed for issues:  Issues making decisions:  Less interest in hobbies / activities:  Repeats questions, stories (family complaining):  Trouble using ordinary gadgets (microwave, computer, phone):  Forgets the month or year:   Mismanaging finances:   Remembering appts:  Daily problems with thinking and/or memory: Ad8 score is= 0     Immunization History  Administered Date(s) Administered  . Td 01/16/2002, 11/10/2014   Screening Tests Health Maintenance  Topic Date Due  .  COLONOSCOPY  10/13/2017 (Originally 12/26/2013)  . TETANUS/TDAP  11/09/2024      Plan:     PCP Notes   Health Maintenance Declines flu due to hx of Guillain Barre syndrome States his colonoscopy is on hold; based on his blood work To see Dr. Elease Hashimoto in [facility] Oct for blood work  If blood work is ok, will not pursue any further colonoscopies. Placed on postpone x 1 year with verbal consent, as the keeps up with his labs    Abnormal Screens  States he had one fall;   Referrals  To Dr. Elease Hashimoto to discuss recent risk for falls. (fell in April) States he can tell he is not picking his feet up. States his feet feel heavy.  Wife states he is not exercising as he did.  Would like to be evaluated.   Patient concerns; As noted  Nurse Concerns; As noted  Next PCP apt Agrees to fup with Dr. Elease Hashimoto      I have personally reviewed and noted the following in the patient's chart:   . Medical and social history . Use of alcohol, tobacco or illicit drugs  . Current medications and supplements . Functional ability and status . Nutritional status . Physical activity . Advanced directives . List of other physicians . Hospitalizations, surgeries, and ER visits in previous 12 months . Vitals . Screenings to include cognitive, depression, and falls . Referrals and appointments  In addition, I have reviewed and discussed with patient certain  preventive protocols, quality metrics, and best practice recommendations. A written personalized care plan for preventive services as well as general preventive health recommendations were provided to patient.     MWUXL,KGMWN, RN  10/04/2016  Agree with assessment as above.  Will discuss fall risk and assess mobility at follow up.  May benefit from some PT.  Eulas Post MD [facility] at [facility]

## 2016-10-04 ENCOUNTER — Ambulatory Visit (INDEPENDENT_AMBULATORY_CARE_PROVIDER_SITE_OTHER): Payer: Medicare HMO

## 2016-10-04 VITALS — BP 118/80 | HR 96 | Ht 69.0 in | Wt 187.0 lb

## 2016-10-04 DIAGNOSIS — Z0000 Encounter for general adult medical examination without abnormal findings: Secondary | ICD-10-CM

## 2016-10-04 NOTE — Patient Instructions (Addendum)
Mr. Ronald Gutierrez , Thank you for taking time to come for your Medicare Wellness Visit. I appreciate your ongoing commitment to your health goals. Please review the following plan we discussed and let me know if I can assist you in the future.   Will make an apt with Dr. Elease Hashimoto to discuss changes in sensation to LE and difficulty picking feet up.     These are the goals we discussed: Goals    . Exercise 150 minutes per week (moderate activity)          Will continue to play golf! Stay active        This is a list of the screening recommended for you and due dates:  Health Maintenance  Topic Date Due  . Colon Cancer Screening  10/13/2017*  . Tetanus Vaccine  11/09/2024  *Topic was postponed. The date shown is not the original due date.    Community Occupational psychologist of Services Cost  A Matter of Balance Class locations vary. Call [place] on Aging for more information.  [email or web address]-[email or web address]/ [phone] 8-Session program addressing the fear of falling and increasing activity levels of older adults Free to minimal cost  A.C.T. By [employer] [address] [place], [place] [address].  BetaBlues.dk [phone]  Personal training, gym, classes including Silver Sneakers* and ACTion for Aging Adults Fee-based  A.H.O.Y. (Add Health to Colon) Airs on Time [employer] 13, M-F at [place]: [employer],  [place] [facility] [place],  [place], [place] [facility], [address] S[address] [facility], [place], [place], [place] [address]  [place] Location: Sharrell Ku. [employer] [place]       [phone]  [phone]  [phone]  [phone]  [phone]  [phone]  [phone]  [phone]  [phone]  [phone]    [phone] A total-body conditioning class for adults 77 and older; designed to increase muscular strength, endurance, range of movement, flexibility, balance, agility and coordination Free  [facility] [place], [place] [address] [place]      [address]      [phone]      Pilate's class for individualsreturning to exercise after an injury, before or after surgery or for individuals with complex musculoskeletal issues; designed to improve strength, balance , flexibility      $15/class  [place] [address] [place], [place] [address] [email or web address] [place] classes for beginners to advanced [place], [place] [address] [email or web address] [place] Chair Exercises Free, ages 27 and older; Ages 73-59 fee based  Marvia Pickles, [facility] [address] [place], [place] [address] Seniorcenter_1 .Beverlee Nims [phone]  A.H.O.Y. Tai Chi Fee-based Donation based or free  Platter Class locations vary.  Call or email Angela Burke or view website for more information. Info_[email or web address] .com [email or web address].com.[email or web address] [phone] Ongoing classes at local YMC[employer] and gyms Fee-based  Silver Sneakers A.C.T. By [place] [employer]: Area [employer]  [facility] [place] [phone]  [phone] Classes designed for older adults who want to improve their strength, flexibility, balance and endurance.   Silver sneakers is covered by some insurance plans and includes a fitness center membership at participating locations. Find out more by calling [phone] or visiting [email or web address] Covered by some insurance plans  [facility] [phone] A.H.O.Y., fitness room, personal training, fitness classes for injury prevention, strength, balance, flexibility, water fitness classes Ages 55+: $64 for 6 months; Ages 23-54: $80 for 6 months  Tai Chi for Everybody [facility] [address] [place], [place] [address] Taichiforeverybody_0 .Patsi Sears [phone] Tai Chi classes for beginners to advanced; geared for seniors Donation Based      [employer] (Hel[employer] Others Participate in Exercise     Loyal Gambler. Rosana Hoes, PhD, [place] [email or web address] [place]     [phone]     A comprehensive fitness program for adults.  The program paris senior-level undergraduates Kinesiology students with adults who desire to learn how to exercise safely.  Includes a structural exercise class focusing on functional fitnesss     $100/semester in fall and spring; $75 in summer (no trainers)    *Silver Sneakers is covered by some Personal assistant and includes a  Radio producer at participating locations.  Find out more by calling [phone] or visiting [email or web address]  For additional health and human services resources for senior adults, please contact SeniorLine at [phone] in [place] and [place] at [phone] in all other areas.    Fall Prevention in the Home Falls can cause injuries. They can happen to people of all ages. There  are many things you can do to make your home safe and to help prevent falls. What can I do on the outside of my home?  Regularly fix the edges of walkways and driveways and fix any cracks.  Remove anything that might make you trip as you walk through a door, such as a raised step or threshold.  Trim any bushes or trees on the path to your home.  Use bright outdoor lighting.  Clear any walking paths of anything that might make someone trip, such as rocks or tools.  Regularly check to see if handrails are loose or broken. Make sure that both sides of any steps have handrails.  Any raised decks and porches should have guardrails on the edges.  Have any leaves, snow, or ice cleared regularly.  Use sand or salt on walking paths during winter.  Clean up any spills in your garage right away. This includes oil or grease spills. What can I do in the bathroom?  Use night lights.  Install grab bars by the toilet and in the tub and shower. Do not use towel bars as grab bars.  Use non-skid mats or decals in the tub or shower.  If you need to sit down in the shower, use a plastic, non-slip stool.  Keep the floor dry. Clean up any water that spills on the floor as soon as it happens.  Remove soap buildup in the tub or shower regularly.  Attach bath mats securely with double-sided non-slip rug tape.  Do not have throw rugs and other things on the floor that can make you trip. What can I do in  the bedroom?  Use night lights.  Make sure that you have a light by your bed that is easy to reach.  Do not use any sheets or blankets that are too big for your bed. They should not hang down onto the floor.  Have a firm chair that has side arms. You can use this for support while you get dressed.  Do not have throw rugs and other things on the floor that can make you trip. What can I do in the kitchen?  Clean up any spills right away.  Avoid walking on wet floors.  Keep items that you  use a lot in easy-to-reach places.  If you need to reach something above you, use a strong step stool that has a grab bar.  Keep electrical cords out of the way.  Do not use floor polish or wax that makes floors slippery. If you must use wax, use non-skid floor wax.  Do not have throw rugs and other things on the floor that can make you trip. What can I do with my stairs?  Do not leave any items on the stairs.  Make sure that there are handrails on both sides of the stairs and use them. Fix handrails that are broken or loose. Make sure that handrails are as long as the stairways.  Check any carpeting to make sure that it is firmly attached to the stairs. Fix any carpet that is loose or worn.  Avoid having throw rugs at the top or bottom of the stairs. If you do have throw rugs, attach them to the floor with carpet tape.  Make sure that you have a light switch at the top of the stairs and the bottom of the stairs. If you do not have them, ask someone to add them for you. What else can I do to help prevent falls?  Wear shoes that: ? Do not have high heels. ? Have rubber bottoms. ? Are comfortable and fit you well. ? Are closed at the toe. Do not wear sandals.  If you use a stepladder: ? Make sure that it is fully opened. Do not climb a closed stepladder. ? Make sure that both sides of the stepladder are locked into place. ? Ask someone to hold it for you, if possible.  Clearly mark and make sure that you can see: ? Any grab bars or handrails. ? First and last steps. ? Where the edge of each step is.  Use tools that help you move around (mobility aids) if they are needed. These include: ? Canes. ? Walkers. ? Scooters. ? Crutches.  Turn on the lights when you go into a dark area. Replace any light bulbs as soon as they burn out.  Set up your furniture so you have a clear path. Avoid moving your furniture around.  If any of your floors are uneven, fix them.  If there are  any pets around you, be aware of where they are.  Review your medicines with your doctor. Some medicines can make you feel dizzy. This can increase your chance of falling. Ask your doctor what other things that you can do to help prevent falls. This information is not intended to replace advice given to you by your health care provider. Make sure you discuss any questions you have with your health care provider. Document Released: 10/29/2008 Document Revised: 06/10/2015 Document Reviewed: 02/06/2014 Elsevier Interactive Patient Education  2018 [place] Maintenance, Male A healthy lifestyle and  preventive care is important for your health and wellness. Ask your health care provider about what schedule of regular examinations is right for you. What should I know about weight and diet? Eat a Healthy Diet  Eat plenty of vegetables, fruits, whole grains, low-fat dairy products, and lean protein.  Do not eat a lot of foods high in solid fats, added sugars, or salt.  Maintain a Healthy Weight Regular exercise can help you achieve or maintain a healthy weight. You should:  Do at least 150 minutes of exercise each week. The exercise should increase your heart rate and make you sweat (moderate-intensity exercise).  Do strength-training exercises at least twice a week.  Watch Your Levels of Cholesterol and Blood Lipids  Have your blood tested for lipids and cholesterol every 5 years starting at 81 years of age. If you are at high risk for heart disease, you should start having your blood tested when you are 81 years old. You may need to have your cholesterol levels checked more often if: ? Your lipid or cholesterol levels are high. ? You are older than 81 years of age. ? You are at high risk for heart disease.  What should I know about cancer screening? Many types of cancers can be detected early and may often be prevented. Lung Cancer  You should be screened every year for  lung cancer if: ? You are a current smoker who has smoked for at least 30 years. ? You are a former smoker who has quit within the past 15 years.  Talk to your health care provider about your screening options, when you should start screening, and how often you should be screened.  Colorectal Cancer  Routine colorectal cancer screening usually begins at 81 years of age and should be repeated every 5-10 years until you are 81 years old. You may need to be screened more often if early forms of precancerous polyps or small growths are found. Your health care provider may recommend screening at an earlier age if you have risk factors for colon cancer.  Your health care provider may recommend using home test kits to check for hidden blood in the stool.  A small camera at the end of a tube can be used to examine your colon (sigmoidoscopy or colonoscopy). This checks for the earliest forms of colorectal cancer.  Prostate and Testicular Cancer  Depending on your age and overall health, your health care provider may do certain tests to screen for prostate and testicular cancer.  Talk to your health care provider about any symptoms or concerns you have about testicular or prostate cancer.  Skin Cancer  Check your skin from head to toe regularly.  Tell your health care provider about any new moles or changes in moles, especially if: ? There is a change in a mole's size, shape, or color. ? You have a mole that is larger than a pencil eraser.  Always use sunscreen. Apply sunscreen liberally and repeat throughout the day.  Protect yourself by wearing long sleeves, pants, a wide-brimmed hat, and sunglasses when outside.  What should I know about heart disease, diabetes, and high blood pressure?  If you are 50-30 years of age, have your blood pressure checked every 3-5 years. If you are 2 years of age or older, have your blood pressure checked every year. You should have your blood pressure  measured twice-once when you are at a hospital or clinic, and once when you are not at a hospital  or clinic. Record the average of the two measurements. To check your blood pressure when you are not at a hospital or clinic, you can use: ? An automated blood pressure machine at a pharmacy. ? A home blood pressure monitor.  Talk to your health care provider about your target blood pressure.  If you are between 22-44 years old, ask your health care provider if you should take aspirin to prevent heart disease.  Have regular diabetes screenings by checking your fasting blood sugar level. ? If you are at a normal weight and have a low risk for diabetes, have this test once every three years after the age of 34. ? If you are overweight and have a high risk for diabetes, consider being tested at a younger age or more often.  A one-time screening for abdominal aortic aneurysm (AAA) by ultrasound is recommended for men aged 12-75 years who are current or former smokers. What should I know about preventing infection? Hepatitis B If you have a higher risk for hepatitis B, you should be screened for this virus. Talk with your health care provider to find out if you are at risk for hepatitis B infection. Hepatitis C Blood testing is recommended for:  Everyone born from [birth year] through 1963.  Anyone with known risk factors for hepatitis C.  Sexually Transmitted Diseases (STDs)  You should be screened each year for STDs including gonorrhea and chlamydia if: ? You are sexually active and are younger than 81 years of age. ? You are older than 81 years of age and your health care provider tells you that you are at risk for this type of infection. ? Your sexual activity has changed since you were last screened and you are at an increased risk for chlamydia or gonorrhea. Ask your health care provider if you are at risk.  Talk with your health care provider about whether you are at high risk of being infected  with HIV. Your health care provider may recommend a prescription medicine to help prevent HIV infection.  What else can I do?  Schedule regular health, dental, and eye exams.  Stay current with your vaccines (immunizations).  Do not use any tobacco products, such as cigarettes, chewing tobacco, and e-cigarettes. If you need help quitting, ask your health care provider.  Limit alcohol intake to no more than 2 drinks per day. One drink equals 12 ounces of beer, 5 ounces of wine, or 1 ounces of hard liquor.  Do not use street drugs.  Do not share needles.  Ask your health care provider for help if you need support or information about quitting drugs.  Tell your health care provider if you often feel depressed.  Tell your health care provider if you have ever been abused or do not feel safe at home. This information is not intended to replace advice given to you by your health care provider. Make sure you discuss any questions you have with your health care provider. Document Released: 07/01/2007 Document Revised: 09/01/2015 Document Reviewed: 10/06/2014 Elsevier Interactive Patient Education  [employer].

## 2016-10-05 ENCOUNTER — Encounter: Payer: Self-pay | Admitting: Family Medicine

## 2016-10-11 DIAGNOSIS — R69 Illness, unspecified: Secondary | ICD-10-CM | POA: Diagnosis not present

## 2016-11-07 DIAGNOSIS — R69 Illness, unspecified: Secondary | ICD-10-CM | POA: Diagnosis not present

## 2016-11-13 ENCOUNTER — Other Ambulatory Visit: Payer: Self-pay | Admitting: Family Medicine

## 2016-12-04 ENCOUNTER — Other Ambulatory Visit: Payer: Self-pay | Admitting: Family Medicine

## 2016-12-08 ENCOUNTER — Other Ambulatory Visit: Payer: Self-pay | Admitting: Internal Medicine

## 2017-02-18 ENCOUNTER — Other Ambulatory Visit: Payer: Self-pay | Admitting: Family Medicine

## 2017-02-20 ENCOUNTER — Ambulatory Visit (INDEPENDENT_AMBULATORY_CARE_PROVIDER_SITE_OTHER): Payer: Medicare HMO | Admitting: Family Medicine

## 2017-02-20 ENCOUNTER — Encounter: Payer: Self-pay | Admitting: Family Medicine

## 2017-02-20 VITALS — BP 150/82 | HR 74 | Temp 99.2°F | Ht 69.0 in | Wt 185.0 lb

## 2017-02-20 DIAGNOSIS — R0989 Other specified symptoms and signs involving the circulatory and respiratory systems: Secondary | ICD-10-CM

## 2017-02-20 DIAGNOSIS — J069 Acute upper respiratory infection, unspecified: Secondary | ICD-10-CM

## 2017-02-20 DIAGNOSIS — R52 Pain, unspecified: Secondary | ICD-10-CM | POA: Diagnosis not present

## 2017-02-20 LAB — POC INFLUENZA A&B (BINAX/QUICKVUE)
INFLUENZA B, POC: NEGATIVE
Influenza A, POC: NEGATIVE

## 2017-02-20 NOTE — Patient Instructions (Signed)

## 2017-02-20 NOTE — Progress Notes (Signed)
Subjective:     Patient ID: Ronald Gutierrez, male   DOB: 1926-06-20, 84 y.o.   MRN: [id number]  HPI  Patient seen for acute visit. He developed onset of symptoms Sunday of nasal congestion, cough, sneezing, body aches, and fatigue. Still played 18 holes of golf yesterday but felt very tired afterwards. He is not aware of any fever. No nausea or vomiting. Nasal discharge is clear.  Probably has some chronic bibasilar crackles in looking back over notes from urgent care back in 2016. No dyspnea  Past Medical History:  Diagnosis Date  . ANEMIA   . Colon adenocarcinoma ([place]) 05/2008   T4, N0  . Diverticulitis   . FATIGUE 05/13/2009  . GERD 05/25/2008  . Guillain-Barre syndrome ([place])   . HYPERLIPIDEMIA 05/20/2008  . HYPERTENSION 05/20/2008  . Hypothyroidism   . KERATOSIS 10/21/2008  . Tubular adenoma of colon 10/2009   Past Surgical History:  Procedure Laterality Date  . APPENDECTOMY  1948  . CATARACT EXTRACTION    . CERVICAL LAMINECTOMY  1968  . COLECTOMY  07/2008   transverse  . HEMORRHOID SURGERY    . HERNIA REPAIR     inguinal  . LUMBAR LAMINECTOMY  1970  . OMENTECTOMY      reports that he quit smoking about 49 years ago. His smoking use included cigarettes. He has a 22.50 pack-year smoking history. he has never used smokeless tobacco. He reports that he drinks alcohol. He reports that he does not use drugs. family history includes Heart disease in his brother; Lung cancer in his brother; Prostate cancer in his brother; Stroke in his father. Allergies  Allergen Reactions  . Ferrous Sulfate     REACTION: Stomach upset    Review of Systems  Constitutional: Positive for fatigue.  HENT: Positive for congestion and rhinorrhea.   Respiratory: Positive for cough. Negative for shortness of breath.        Objective:   Physical Exam  Constitutional: He appears well-developed and well-nourished.  HENT:  Right Ear: External ear normal.  Left Ear: External ear normal.  Mouth/Throat:  Oropharynx is clear and moist.  Neck: Neck supple.  Cardiovascular: Normal rate and regular rhythm.  Pulmonary/Chest: Effort normal. He has no wheezes.  He has some bibasilar crackles-? Chronic  Musculoskeletal: He exhibits no edema.  Lymphadenopathy:    He has no cervical adenopathy.  Neurological: He is alert.       Assessment:     Probable viral URI with cough. He has some bibasilar crackles and looking over some past notes these have been noted previously.  Influenza screen negative    Plan:     -Treat symptomatically with plenty fluids and rest and continue Tylenol as needed for body aches -Follow-up promptly for any fever or worsening symptoms  Eulas Post MD [facility]

## 2017-03-05 ENCOUNTER — Other Ambulatory Visit: Payer: Self-pay | Admitting: Internal Medicine

## 2017-03-05 ENCOUNTER — Other Ambulatory Visit: Payer: Self-pay | Admitting: Family Medicine

## 2017-03-08 ENCOUNTER — Other Ambulatory Visit: Payer: Self-pay | Admitting: Internal Medicine

## 2017-03-08 ENCOUNTER — Other Ambulatory Visit: Payer: Self-pay | Admitting: Family Medicine

## 2017-03-21 ENCOUNTER — Ambulatory Visit (INDEPENDENT_AMBULATORY_CARE_PROVIDER_SITE_OTHER): Payer: Medicare HMO | Admitting: Internal Medicine

## 2017-03-21 ENCOUNTER — Encounter: Payer: Self-pay | Admitting: Internal Medicine

## 2017-03-21 VITALS — BP 124/76 | HR 74 | Ht 69.0 in | Wt 184.4 lb

## 2017-03-21 DIAGNOSIS — E039 Hypothyroidism, unspecified: Secondary | ICD-10-CM | POA: Diagnosis not present

## 2017-03-21 LAB — TSH: TSH: <0.01 u[IU]/mL — ABNORMAL LOW (ref 0.35–4.50)

## 2017-03-21 LAB — T4, FREE: FREE T4: 1.43 ng/dL (ref 0.60–1.60)

## 2017-03-21 MED ORDER — LEVOTHYROXINE SODIUM 112 MCG PO TABS: 112.0000 ug | ORAL_TABLET | Freq: Every day | ORAL | 1 refills | Status: DC

## 2017-03-21 NOTE — Patient Instructions (Signed)
Please continue levothyroxine 125 mcg daily  Take the thyroid hormone every day, with water, at least 30 minutes before breakfast, separated by at least 4 hours from: - acid reflux medications - calcium - iron - multivitamins  Please stop at the lab.  Please come back for a follow-up appointment in 1 year.

## 2017-03-21 NOTE — Progress Notes (Signed)
Patient ID: Ronald Gutierrez, male   DOB: 1926/05/05, 84 y.o.   MRN: [id number]   HPI  Ronald Gutierrez is a 84 y.o.-year-old male, returning for f/u for hypothyroidism, dx 2010. Last visit 1 year ago.  Reviewed and addended history: Pt has a history of poorly controlled hypothyroidism, with previous TSH levels fluctuating, but mostly elevated due to taking levothyroxine along with Orange juice, multivitamins, PPIs.  After we separated his levothyroxine from his supplements and medications, his tests improved to the point of a suppressed TSH, for which we had to decrease his levothyroxine dose from 137 to 125 mcg at last visit a year ago.  He did not come back for labs after the decrease in dose, he forgot...  Pt is on levothyroxine 125 mcg daily, taken: - in am - fasting - at least 1h from b'fast - no Ca, Fe - + MVI, + PPIs later in the day - not on Biotin  I  reviewed patient's thyroid tests: Lab Results  Component Value Date   TSH 0.01 (L) 03/22/2016   TSH 4.83 (H) 05/11/2015   TSH 8.76 (H) 03/23/2015   TSH 1.99 11/03/2014   TSH 3.70 09/22/2014   TSH 7.13 (H) 03/17/2014   TSH 8.85 (H) 10/28/2013   TSH 0.009 (L) 09/16/2013   TSH 9.19 (H) 04/22/2013   TSH 69.61 (H) 03/19/2013   FREET4 1.53 03/22/2016   FREET4 1.40 05/11/2015   FREET4 1.38 03/23/2015   FREET4 1.17 09/22/2014   FREET4 1.32 03/17/2014   FREET4 1.13 10/28/2013   FREET4 1.85 (H) 09/16/2013   FREET4 1.52 04/22/2013    Pt denies: - feeling nodules in neck - hoarseness - dysphagia - choking - SOB with lying down  He also has a history of colon cancer (s/p colectomy in 2010), HL, HTN, GERD.  No FH of thyroid cancer. No h/o radiation tx to head or neck.  No seaweed or kelp. No recent contrast studies. No herbal supplements. No Biotin use. No recent steroids use.   ROS: Constitutional: no weight gain/no weight loss, + fatigue, no subjective hyperthermia, no subjective hypothermia Eyes: no blurry vision, no  xerophthalmia ENT: no sore throat,  + see HPI Cardiovascular: no CP/no SOB/no palpitations/no leg swelling Respiratory: no cough/no SOB/no wheezing Gastrointestinal: no N/no V/no D/no C/no acid reflux Musculoskeletal: no muscle aches/no joint aches Skin: no rashes, no hair loss Neurological: no tremors/no numbness/no tingling/no dizziness  I reviewed pt's medications, allergies, PMH, social hx, family hx, and changes were documented in the history of present illness. Otherwise, unchanged from my initial visit note.  PE: BP 124/76   Pulse 74   Ht 5\' 9"  (1.753 m)   Wt 184 lb 6.4 oz (83.6 kg)   SpO2 98%   BMI 27.23 kg/m  Body mass index is 27.23 kg/m.  Wt Readings from Last 3 Encounters:  03/21/17 184 lb 6.4 oz (83.6 kg)  02/20/17 185 lb (83.9 kg)  10/04/16 187 lb (84.8 kg)   Constitutional: overweight, in NAD Eyes: PERRLA, EOMI, no exophthalmos ENT: moist mucous membranes, no thyromegaly, no cervical lymphadenopathy Cardiovascular: RRR, No MRG Respiratory: CTA B Gastrointestinal: abdomen soft, NT, ND, BS+ Musculoskeletal: no deformities, strength intact in all 4 Skin: moist, warm, no rashes Neurological: no tremor with outstretched hands, DTR undetectable all 4 (h/o Guillain-Barre)  ASSESSMENT: 1. Hypothyroidism - uncontrolled  PLAN:  1. Patient with long history of hypothyroidism, on levothyroxine therapy, with fluctuating TFTs initially due to not taking his medication correctly.  In the  last 2 years, he started to take the medications correctly and his TFTs were improving.  At last visit, his TSH was actually suppressed, so we had to back off on his dose of levothyroxine from 137 to 125 mcg daily.  Unfortunately, he did not come back for a recheck of his TFTs after the decrease in dose. - he continues on LT4 125 mcg daily - pt feels good on this dose.  He does not have significant fatigue (has some deconditioning >> advised to start doing some form of exercise - e.g.  Stationary bike, indoors). He continues to play golf, weather permitting. - we discussed about taking the thyroid hormone every day, with water, >30 minutes before breakfast, separated by >4 hours from acid reflux medications, calcium, iron, multivitamins. Pt. is taking it correctly. - will check thyroid tests today: TSH and fT4 - If labs are abnormal, he will need to return for repeat TFTs in 1.5 months - If labs are normal, I will see him back in a year  - time spent with the patient: 15 min, of which >50% was spent in obtaining information about his symptoms, reviewing his previous labs, evaluations, and treatments, and developing a plan to further investigate and treat it; he had a number of questions which I addressed.  Component     Latest Ref Rng & Units 03/21/2017  TSH     0.35 - 4.50 uIU/mL <0.01 Repeated and verified X2. (L)  T4,Free(Direct)     0.60 - 1.60 ng/dL 1.43   His TSH is still quite suppressed.  We will need to back off levothyroxine even more, to 112 mcg daily and have him back for labs in 1.5 months.  Ronald Kingdom, MD PhD [facility]

## 2017-03-23 ENCOUNTER — Telehealth: Payer: Self-pay | Admitting: Internal Medicine

## 2017-03-23 MED ORDER — LEVOTHYROXINE SODIUM 112 MCG PO TABS: 112.0000 ug | ORAL_TABLET | Freq: Every day | ORAL | 1 refills | Status: DC

## 2017-03-23 NOTE — Telephone Encounter (Signed)
° °  levothyroxine (SYNTHROID,   LEVOTHROID) 112 MCG tablet    [place], [place]   Patient states this was sent to the wrong pharmacy. It was sent to [place] and patient states he does not use that pharmacy anymore.

## 2017-03-23 NOTE — Telephone Encounter (Signed)
Called pt. Informed Rx redirected to pharmacy requested.

## 2017-03-29 ENCOUNTER — Telehealth: Payer: Self-pay | Admitting: *Deleted

## 2017-03-29 NOTE — Telephone Encounter (Signed)
Patient's spouse came into office requesting clinical advice.  At 2am this morning, patient developed nausea, vomiting, and diarrhea. He is continuing to feel weak and nauseous this morning. Recommended rest and fluids for patient, and discussed risk of dehydration, especially given patient's age. Wife states she has been giving him Gatorade to sip on. If symptoms continue through the morning, recommend office visit. Wife verbalized understanding and agreed w/ plan. She will call for appointment if no improvement or worsening symptoms by lunch today. Also discussed infection prevention/hand hygiene practices to prevent spread of illness.   To PCP for FYI.

## 2017-05-14 ENCOUNTER — Other Ambulatory Visit: Payer: Self-pay | Admitting: Family Medicine

## 2017-05-17 ENCOUNTER — Other Ambulatory Visit: Payer: Self-pay | Admitting: Family Medicine

## 2017-06-02 ENCOUNTER — Other Ambulatory Visit: Payer: Self-pay | Admitting: Family Medicine

## 2017-06-12 ENCOUNTER — Other Ambulatory Visit: Payer: Self-pay | Admitting: Internal Medicine

## 2017-06-12 ENCOUNTER — Other Ambulatory Visit (INDEPENDENT_AMBULATORY_CARE_PROVIDER_SITE_OTHER): Payer: Medicare HMO

## 2017-06-12 DIAGNOSIS — E039 Hypothyroidism, unspecified: Secondary | ICD-10-CM

## 2017-06-12 LAB — TSH: TSH: 0.02 u[IU]/mL — ABNORMAL LOW (ref 0.35–4.50)

## 2017-06-12 LAB — T4, FREE: FREE T4: 1.41 ng/dL (ref 0.60–1.60)

## 2017-06-12 MED ORDER — LEVOTHYROXINE SODIUM 100 MCG PO TABS
100.0000 ug | ORAL_TABLET | Freq: Every day | ORAL | 1 refills | Status: DC
Start: 2017-06-12 — End: 2017-06-13

## 2017-06-13 ENCOUNTER — Telehealth: Payer: Self-pay | Admitting: Internal Medicine

## 2017-06-13 ENCOUNTER — Other Ambulatory Visit: Payer: Self-pay

## 2017-06-13 MED ORDER — LEVOTHYROXINE SODIUM 100 MCG PO TABS: 100.0000 ug | ORAL_TABLET | Freq: Every day | ORAL | 1 refills | Status: DC

## 2017-06-13 NOTE — Telephone Encounter (Signed)
Patient stated that he needs to speak to someone about making changes to his medication Please advise    levothyroxine (SYNTHROID, LEVOTHROID) 100 MCG tablet

## 2017-06-13 NOTE — Telephone Encounter (Signed)
Patient was requesting a 90 day supply so I sent it in and informed him of this

## 2017-07-05 ENCOUNTER — Encounter: Payer: Self-pay | Admitting: Family Medicine

## 2017-07-05 ENCOUNTER — Ambulatory Visit (INDEPENDENT_AMBULATORY_CARE_PROVIDER_SITE_OTHER): Payer: Medicare HMO | Admitting: Family Medicine

## 2017-07-05 VITALS — BP 118/72 | HR 72 | Temp 97.5°F | Wt 173.0 lb

## 2017-07-05 DIAGNOSIS — J302 Other seasonal allergic rhinitis: Secondary | ICD-10-CM

## 2017-07-05 DIAGNOSIS — R634 Abnormal weight loss: Secondary | ICD-10-CM | POA: Diagnosis not present

## 2017-07-05 DIAGNOSIS — K529 Noninfective gastroenteritis and colitis, unspecified: Secondary | ICD-10-CM | POA: Diagnosis not present

## 2017-07-05 NOTE — Progress Notes (Signed)
Subjective:    Patient ID: Ronald Gutierrez, male    DOB: [birth year], 84 y.o.   MRN: [id number]  No chief complaint on file.   HPI Patient was seen today for acute concern.  Pt endorses diarrhea which started late Sunday night. Thursday early am pt woke up with several episodes of diarrhea and emesis x 1.  Pt does not recall any changes in diet, fever, chills, hematochezia, or melena.  Pt's wife notes they had grilled spare ribs Sunday and ate the leftovers again on Wednesday evening.  Pt's wife does not have similar issues.  Pt drank 1/2 a gatorade today and soup.  Pt was dealing with allergy/viral illness a few wks ago.  Seen by ENT, advised to use nasacort and saline rinse.  No recent abx use.  Per wife pt's appetite has been decreased x several months.  Pt lost 11 lbs since OFV on 04/03/17.    Past Medical History:  Diagnosis Date  . ANEMIA   . Colon adenocarcinoma ([place]) 05/2008   T4, N0  . Diverticulitis   . FATIGUE 05/13/2009  . GERD 05/25/2008  . Guillain-Barre syndrome ([place])   . HYPERLIPIDEMIA 05/20/2008  . HYPERTENSION 05/20/2008  . Hypothyroidism   . KERATOSIS 10/21/2008  . Tubular adenoma of colon 10/2009    Allergies  Allergen Reactions  . Ferrous Sulfate     REACTION: Stomach upset    ROS General: Denies fever, chills, night sweats, changes in weight, changes in appetite  + fatigue HEENT: Denies headaches, ear pain, changes in vision, rhinorrhea, sore throat CV: Denies CP, palpitations, SOB, orthopnea Pulm: Denies SOB, cough, wheezing GI: Denies abdominal pain, nausea, vomiting, constipation   + diarrhea, emesis GU: Denies dysuria, hematuria, frequency, vaginal discharge Msk: Denies muscle cramps, joint pains Neuro: Denies weakness, numbness, tingling Skin: Denies rashes, bruising Psych: Denies depression, anxiety, hallucinations     Objective:    Blood pressure 118/72, pulse 72, temperature (!) 97.5 F (36.4 C), temperature source Oral, weight 173 lb (78.5 kg), SpO2 97  %.   Gen. Pleasant, well-nourished, in no distress, normal affect   HEENT: [place]/AT, face symmetric,  no scleral icterus, PERRLA, nares patent without drainage, pharynx with postnasal drainage, no erythema or exudate.  Lungs: no accessory muscle use, CTAB, no wheezes or rales Cardiovascular: RRR, no m/r/g, no peripheral edema Abdomen: Normoactive bowel sounds, soft, NT/ND Neuro:  A&Ox3, CN II-XII intact, normal gait Skin:  Warm, no lesions/ rash   Wt Readings from Last 3 Encounters:  07/05/17 173 lb (78.5 kg)  03/21/17 184 lb 6.4 oz (83.6 kg)  02/20/17 185 lb (83.9 kg)    Lab Results  Component Value Date   WBC 7.2 11/16/2015   HGB 12.4 (L) 11/16/2015   HCT 36.5 (L) 11/16/2015   PLT 269.0 11/16/2015   GLUCOSE 94 11/16/2015   CHOL 155 11/16/2015   TRIG 114.0 11/16/2015   HDL 72.40 11/16/2015   LDLCALC 60 11/16/2015   ALT 14 11/16/2015   AST 19 11/16/2015   NA 141 11/16/2015   K 5.0 11/16/2015   CL 107 11/16/2015   CREATININE 0.87 11/16/2015   BUN 21 11/16/2015   CO2 26 11/16/2015   TSH 0.02 (L) 06/12/2017   PSA 1.98 11/03/2014   INR 1.2 07/13/2008    Assessment/Plan:  Gastroenteritis -possibly 2/2 pork spare ribs as pt with symptoms after eating on both nights.  Also consider viral cause -Discussed supportive care, gentle hydration, bland diet -Handouts given -Discussed using probiotic and Imodium as  needed -Given RTC or ED precautions for worsening symptoms or dehydration.  Seasonal allergies -Continue Nasacort -Consider p.o. allergy medication such as Allegra  Weight loss -11 pound weight loss in 3 months -Discussed trying to drink boost or Ensure -Increasing snacks  follow-up with PCP next week, sooner if needed  Grier Mitts, MD

## 2017-07-05 NOTE — Patient Instructions (Addendum)
You can take imodium to help relieve your diarrhea.  Remember to stay hydrated.  If your symptoms continue or become worse please return to clinic or the Emergency department for further evaluation.   Bland Diet A bland diet consists of foods that do not have a lot of fat or fiber. Foods without fat or fiber are easier for the body to digest. They are also less likely to irritate your mouth, throat, stomach, and other parts of your gastrointestinal tract. A bland diet is sometimes called a BRAT diet. What is my plan? Your health care provider or dietitian may recommend specific changes to your diet to prevent and treat your symptoms, such as:  Eating small meals often.  Cooking food until it is soft enough to chew easily.  Chewing your food well.  Drinking fluids slowly.  Not eating foods that are very spicy, sour, or fatty.  Not eating citrus fruits, such as oranges and grapefruit.  What do I need to know about this diet?  Eat a variety of foods from the bland diet food list.  Do not follow a bland diet longer than you have to.  Ask your health care provider whether you should take vitamins. What foods can I eat? Grains  Hot cereals, such as cream of wheat. Bread, crackers, or tortillas made from refined white flour. Rice. Vegetables Canned or cooked vegetables. Mashed or boiled potatoes. Fruits Bananas. Applesauce. Other types of cooked or canned fruit with the skin and seeds removed, such as canned peaches or pears. Meats and Other Protein Sources Scrambled eggs. Creamy peanut butter or other nut butters. Lean, well-cooked meats, such as chicken or fish. Tofu. Soups or broths. Dairy Low-fat dairy products, such as milk, cottage cheese, or yogurt. Beverages Water. Herbal tea. Apple juice. Sweets and Desserts Pudding. Custard. Fruit gelatin. Ice cream. Fats and Oils Mild salad dressings. Canola or olive oil. The items listed above may not be a complete list of allowed  foods or beverages. Contact your dietitian for more options. What foods are not recommended? Foods and ingredients that are often not recommended include:  Spicy foods, such as hot sauce or salsa.  Fried foods.  Sour foods, such as pickled or fermented foods.  Raw vegetables or fruits, especially citrus or berries.  Caffeinated drinks.  Alcohol.  Strongly flavored seasonings or condiments.  The items listed above may not be a complete list of foods and beverages that are not allowed. Contact your dietitian for more information. This information is not intended to replace advice given to you by your health care provider. Make sure you discuss any questions you have with your health care provider. Document Released: 04/26/2015 Document Revised: 06/10/2015 Document Reviewed: 01/14/2014 Elsevier Interactive Patient Education  2018 [place] Choices to Help Relieve Diarrhea, Adult When you have diarrhea, the foods you eat and your eating habits are very important. Choosing the right foods and drinks can help:  Relieve diarrhea.  Replace lost fluids and nutrients.  Prevent dehydration.  What general guidelines should I follow? Relieving diarrhea  Choose foods with less than 2 g or .07 oz. of fiber per serving.  Limit fats to less than 8 tsp (38 g or 1.34 oz.) a day.  Avoid the following: ? Foods and beverages sweetened with high-fructose corn syrup, honey, or sugar alcohols such as xylitol, sorbitol, and mannitol. ? Foods that contain a lot of fat or sugar. ? Fried, greasy, or spicy foods. ? High-fiber grains, breads, and cereals. ?  Raw fruits and vegetables.  Eat foods that are rich in probiotics. These foods include dairy products such as yogurt and fermented milk products. They help increase healthy bacteria in the stomach and intestines (gastrointestinal tract, or GI tract).  If you have lactose intolerance, avoid dairy products. These may make your diarrhea  worse.  Take medicine to help stop diarrhea (antidiarrheal medicine) only as told by your health care provider. Replacing nutrients  Eat small meals or snacks every 3-4 hours.  Eat bland foods, such as white rice, toast, or baked potato, until your diarrhea starts to get better. Gradually reintroduce nutrient-rich foods as tolerated or as told by your health care provider. This includes: ? Well-cooked protein foods. ? Peeled, seeded, and soft-cooked fruits and vegetables. ? Low-fat dairy products.  Take vitamin and mineral supplements as told by your health care provider. Preventing dehydration   Start by sipping water or a special solution to prevent dehydration (oral rehydration solution, ORS). Urine that is clear or pale yellow means that you are getting enough fluid.  Try to drink at least 8-10 cups of fluid each day to help replace lost fluids.  You may add other liquids in addition to water, such as clear juice or decaffeinated sports drinks, as tolerated or as told by your health care provider.  Avoid drinks with caffeine, such as coffee, tea, or soft drinks.  Avoid alcohol. What foods are recommended? The items listed may not be a complete list. Talk with your health care provider about what dietary choices are best for you. Grains White rice. White, [place], or pita breads (fresh or toasted), including plain rolls, buns, or bagels. White pasta. Saltine, soda, or graham crackers. Pretzels. Low-fiber cereal. Cooked cereals made with water (such as cornmeal, farina, or cream cereals). Plain muffins. Matzo. Melba toast. Zwieback. Vegetables Potatoes (without the skin). Most well-cooked and canned vegetables without skins or seeds. Tender lettuce. Fruits Apple sauce. Fruits canned in juice. Cooked apricots, cherries, grapefruit, peaches, pears, or plums. Fresh bananas and cantaloupe. Meats and other protein foods Baked or boiled chicken. Eggs. Tofu. Fish. Seafood. Smooth nut  butters. Ground or well-cooked tender beef, ham, veal, lamb, pork, or poultry. Dairy Plain yogurt, kefir, and unsweetened liquid yogurt. Lactose-free milk, buttermilk, skim milk, or soy milk. Low-fat or nonfat hard cheese. Beverages Water. Low-calorie sports drinks. Fruit juices without pulp. Strained tomato and vegetable juices. Decaffeinated teas. Sugar-free beverages not sweetened with sugar alcohols. Oral rehydration solutions, if approved by your health care provider. Seasoning and other foods Bouillon, broth, or soups made from recommended foods. What foods are not recommended? The items listed may not be a complete list. Talk with your health care provider about what dietary choices are best for you. Grains Whole grain, whole wheat, bran, or rye breads, rolls, pastas, and crackers. Wild or brown rice. Whole grain or bran cereals. Barley. Oats and oatmeal. Corn tortillas or taco shells. Granola. Popcorn. Vegetables Raw vegetables. Fried vegetables. Cabbage, broccoli, Brussels sprouts, artichokes, baked beans, beet greens, corn, kale, legumes, peas, sweet potatoes, and yams. Potato skins. Cooked spinach and cabbage. Fruits Dried fruit, including raisins and dates. Raw fruits. Stewed or dried prunes. Canned fruits with syrup. Meat and other protein foods Fried or fatty meats. Deli meats. Chunky nut butters. Nuts and seeds. Beans and lentils. Berniece Salines. Hot dogs. Sausage. Dairy High-fat cheeses. Whole milk, chocolate milk, and beverages made with milk, such as milk shakes. Half-and-half. Cream. sour cream. Ice cream. Beverages Caffeinated beverages (such as coffee, tea,  soda, or energy drinks). Alcoholic beverages. Fruit juices with pulp. Prune juice. Soft drinks sweetened with high-fructose corn syrup or sugar alcohols. High-calorie sports drinks. Fats and oils Butter. Cream sauces. Margarine. Salad oils. Plain salad dressings. Olives. Avocados. Mayonnaise. Sweets and desserts Sweet rolls,  doughnuts, and sweet breads. Sugar-free desserts sweetened with sugar alcohols such as xylitol and sorbitol. Seasoning and other foods Honey. Hot sauce. Chili powder. Gravy. Cream-based or milk-based soups. Pancakes and waffles. Summary  When you have diarrhea, the foods you eat and your eating habits are very important.  Make sure you get at least 8-10 cups of fluid each day, or enough to keep your urine clear or pale yellow.  Eat bland foods and gradually reintroduce healthy, nutrient-rich foods as tolerated, or as told by your health care provider.  Avoid high-fiber, fried, greasy, or spicy foods. This information is not intended to replace advice given to you by your health care provider. Make sure you discuss any questions you have with your health care provider. Document Released: 03/25/2003 Document Revised: 12/31/2015 Document Reviewed: 12/31/2015 Elsevier Interactive Patient Education  [employer].

## 2017-07-11 ENCOUNTER — Ambulatory Visit (INDEPENDENT_AMBULATORY_CARE_PROVIDER_SITE_OTHER): Payer: Medicare HMO | Admitting: Family Medicine

## 2017-07-11 ENCOUNTER — Encounter: Payer: Self-pay | Admitting: Family Medicine

## 2017-07-11 VITALS — BP 120/74 | HR 73 | Temp 98.0°F | Wt 172.2 lb

## 2017-07-11 DIAGNOSIS — R5383 Other fatigue: Secondary | ICD-10-CM | POA: Diagnosis not present

## 2017-07-11 DIAGNOSIS — R634 Abnormal weight loss: Secondary | ICD-10-CM

## 2017-07-11 DIAGNOSIS — S99922A Unspecified injury of left foot, initial encounter: Secondary | ICD-10-CM | POA: Diagnosis not present

## 2017-07-11 DIAGNOSIS — R970 Elevated carcinoembryonic antigen [CEA]: Secondary | ICD-10-CM | POA: Diagnosis not present

## 2017-07-11 MED ORDER — AZELASTINE HCL 0.1 % NA SOLN: 1.0000 | Freq: Two times a day (BID) | NASAL | 12 refills | Status: DC

## 2017-07-11 NOTE — Progress Notes (Signed)
Subjective:     Patient ID: Ronald Gutierrez, male   DOB: 1926/09/04, 84 y.o.   MRN: [id number]  HPI Patient seen with chief complaint of weight loss. His chronic problems include history of colon cancer back in 2010, hypertension, history of GERD, hypothyroidism, hyperlipidemia. His thyroid has been slightly over replaced with recent TSH 0.02. He had normal free T4. Endocrinology adjusting his thyroid medication.  Patient's had 13 pounds of weight loss since February and about 16 pounds compared to last year this time. He has noticed some decreased appetite. He had brief bout with diarrhea last week but those symptoms fully resolved. Had no further nausea or vomiting. No stool changes. No abdominal pain. No headache. No cough. No dyspnea. Occasional dysphagia with solid foods. No pain with swallowing. No myalgias.  Has frequent nasal congestion and postnasal drip symptoms. Mostly clear mucus. Tried Allegra and Nasacort without much improvement. Symptoms are daily. Has bilateral ear fullness.  Recent left second toe injury. He fell out of bed apparently couple nights ago. No loss of consciousness. No head injury. No headache  Past Medical History:  Diagnosis Date  . ANEMIA   . Colon adenocarcinoma ([place]) 05/2008   T4, N0  . Diverticulitis   . FATIGUE 05/13/2009  . GERD 05/25/2008  . Guillain-Barre syndrome ([place])   . HYPERLIPIDEMIA 05/20/2008  . HYPERTENSION 05/20/2008  . Hypothyroidism   . KERATOSIS 10/21/2008  . Tubular adenoma of colon 10/2009   Past Surgical History:  Procedure Laterality Date  . APPENDECTOMY  1948  . CATARACT EXTRACTION    . CERVICAL LAMINECTOMY  1968  . COLECTOMY  07/2008   transverse  . HEMORRHOID SURGERY    . HERNIA REPAIR     inguinal  . LUMBAR LAMINECTOMY  1970  . OMENTECTOMY      reports that he quit smoking about 49 years ago. His smoking use included cigarettes. He has a 22.50 pack-year smoking history. He has never used smokeless tobacco. He reports that he  drinks alcohol. He reports that he does not use drugs. family history includes Heart disease in his brother; Lung cancer in his brother; Prostate cancer in his brother; Stroke in his father. Allergies  Allergen Reactions  . Ferrous Sulfate     REACTION: Stomach upset     Review of Systems  Constitutional: Positive for appetite change, fatigue and unexpected weight change. Negative for chills and fever.  HENT: Positive for congestion, postnasal drip, rhinorrhea and trouble swallowing. Negative for sinus pressure and sinus pain.        Objective:   Physical Exam  Constitutional: He is oriented to person, place, and time. He appears well-developed and well-nourished.  HENT:  Right Ear: External ear normal.  Left Ear: External ear normal.  Mouth/Throat: Oropharynx is clear and moist.  Neck: Neck supple.  Cardiovascular: Normal rate and regular rhythm.  Pulmonary/Chest: Effort normal and breath sounds normal.  Abdominal:  Incisional hernia soft and nontender. Abdominal exam is nontender. No masses palpated other than hernia above  Musculoskeletal: He exhibits no edema.  Lymphadenopathy:    He has no cervical adenopathy.  Neurological: He is alert and oriented to person, place, and time.       Assessment:     #1 weight loss of 13 pounds since February with decreasing appetite. Obviously, at his age worrisome for possible malignancy. He is on thyroid replacement and slightly over replaced but doubt this accounts for his weight loss. He does report some recent mild dysphagia. Prior  history of colon cancer as above  #2 left second toe injury. Possible fracture.  Ambulating without much difficulty.  #3 frequent postnasal drip symptoms. Sounds more allergic.    Plan:     -Set up further labs with CBC, comprehensive metabolic panel, sedimentation rate, CEA level -Low threshold to get CT abdomen and pelvis but will get labs first -Astelin nasal 1 spray per nostril twice daily as  needed -discussed x-rays of left second toe- but would not likely change management and he declines.  Eulas Post MD [facility] at [facility]

## 2017-07-12 LAB — CBC WITH DIFFERENTIAL/PLATELET
BASOS PCT: 0.2 % (ref 0.0–3.0)
Basophils Absolute: 0 10*3/uL (ref 0.0–0.1)
EOS PCT: 4.5 % (ref 0.0–5.0)
Eosinophils Absolute: 0.4 10*3/uL (ref 0.0–0.7)
HEMATOCRIT: 39.5 % (ref 39.0–52.0)
HEMOGLOBIN: 13.6 g/dL (ref 13.0–17.0)
LYMPHS PCT: 22.2 % (ref 12.0–46.0)
Lymphs Abs: 1.8 10*3/uL (ref 0.7–4.0)
MCHC: 34.4 g/dL (ref 30.0–36.0)
MCV: 98.7 fl (ref 78.0–100.0)
MONO ABS: 0.9 10*3/uL (ref 0.1–1.0)
Monocytes Relative: 11.9 % (ref 3.0–12.0)
Neutro Abs: 4.9 10*3/uL (ref 1.4–7.7)
Neutrophils Relative %: 61.2 % (ref 43.0–77.0)
Platelets: 345 10*3/uL (ref 150.0–400.0)
RBC: 4 Mil/uL — ABNORMAL LOW (ref 4.22–5.81)
RDW: 13 % (ref 11.5–15.5)
WBC: 7.9 10*3/uL (ref 4.0–10.5)

## 2017-07-12 LAB — COMPREHENSIVE METABOLIC PANEL
ALBUMIN: 4.4 g/dL (ref 3.5–5.2)
ALT: 13 U/L (ref 0–53)
AST: 17 U/L (ref 0–37)
Alkaline Phosphatase: 82 U/L (ref 39–117)
BUN: 23 mg/dL (ref 6–23)
CALCIUM: 9.5 mg/dL (ref 8.4–10.5)
CO2: 26 mEq/L (ref 19–32)
Chloride: 97 mEq/L (ref 96–112)
Creatinine, Ser: 1.11 mg/dL (ref 0.40–1.50)
GFR: 66.3 mL/min (ref >60.00)
Glucose, Bld: 96 mg/dL (ref 70–99)
POTASSIUM: 4.9 meq/L (ref 3.5–5.1)
Sodium: 131 mEq/L — ABNORMAL LOW (ref 135–145)
Total Bilirubin: 0.5 mg/dL (ref 0.2–1.2)
Total Protein: 7.7 g/dL (ref 6.0–8.3)

## 2017-07-12 LAB — LIPID PANEL
CHOLESTEROL: 161 mg/dL (ref 0–200)
HDL: 69.6 mg/dL (ref >39.00)
LDL Cholesterol: 61 mg/dL (ref 0–99)
NonHDL: 91.07
Total CHOL/HDL Ratio: 2
Triglycerides: 149 mg/dL (ref 0.0–149.0)
VLDL: 29.8 mg/dL (ref 0.0–40.0)

## 2017-07-12 LAB — SEDIMENTATION RATE: SED RATE: 47 mm/h — AB (ref 0–20)

## 2017-07-13 LAB — CEA: CEA: 4.9 ng/mL — ABNORMAL HIGH

## 2017-07-16 ENCOUNTER — Other Ambulatory Visit: Payer: Self-pay | Admitting: Family Medicine

## 2017-07-16 ENCOUNTER — Encounter: Payer: Self-pay | Admitting: Family Medicine

## 2017-07-16 DIAGNOSIS — R634 Abnormal weight loss: Secondary | ICD-10-CM

## 2017-07-17 ENCOUNTER — Telehealth: Payer: Self-pay | Admitting: *Deleted

## 2017-07-17 NOTE — Telephone Encounter (Signed)
Spoke with patient.

## 2017-07-17 NOTE — Telephone Encounter (Signed)
Copied from [place] [id number]. Topic: Quick Communication - See Telephone Encounter >> Jul 17, 2017  9:49 AM Antonieta Iba C wrote: CRM for notification. See Telephone encounter for: 07/17/17.  Pt called in returning and requesting to speak with Rachael. Pt would like a call back at: [phone].

## 2017-07-18 ENCOUNTER — Ambulatory Visit (INDEPENDENT_AMBULATORY_CARE_PROVIDER_SITE_OTHER)
Admission: RE | Admit: 2017-07-18 | Discharge: 2017-07-18 | Disposition: A | Discharge status: Home / Self Care | Payer: Medicare HMO | Source: Ambulatory Visit | Attending: Family Medicine | Admitting: Family Medicine

## 2017-07-18 ENCOUNTER — Ambulatory Visit: Payer: Medicare HMO | Admitting: Family Medicine

## 2017-07-18 DIAGNOSIS — K5730 Diverticulosis of large intestine without perforation or abscess without bleeding: Secondary | ICD-10-CM | POA: Diagnosis not present

## 2017-07-18 DIAGNOSIS — R634 Abnormal weight loss: Secondary | ICD-10-CM

## 2017-07-18 NOTE — Telephone Encounter (Signed)
Copied from [place] [id number]. Topic: General - Other >> Jul 18, 2017  2:48 PM Yvette Rack wrote: Reason for CRM: pt would like for Dr Elease Hashimoto to give him a call when his CT results come back he cancelled his appt today  please give him a call back at [phone]

## 2017-08-07 ENCOUNTER — Other Ambulatory Visit: Payer: Self-pay | Admitting: Family Medicine

## 2017-09-18 ENCOUNTER — Encounter: Payer: Self-pay | Admitting: Family Medicine

## 2017-09-18 ENCOUNTER — Ambulatory Visit (INDEPENDENT_AMBULATORY_CARE_PROVIDER_SITE_OTHER): Payer: Medicare HMO | Admitting: Family Medicine

## 2017-09-18 VITALS — BP 132/68 | HR 65 | Temp 97.6°F | Wt 173.3 lb

## 2017-09-18 DIAGNOSIS — R531 Weakness: Secondary | ICD-10-CM

## 2017-09-18 DIAGNOSIS — E039 Hypothyroidism, unspecified: Secondary | ICD-10-CM

## 2017-09-18 DIAGNOSIS — E871 Hypo-osmolality and hyponatremia: Secondary | ICD-10-CM | POA: Diagnosis not present

## 2017-09-18 LAB — BASIC METABOLIC PANEL
BUN: 25 mg/dL — ABNORMAL HIGH (ref 6–23)
CALCIUM: 9.5 mg/dL (ref 8.4–10.5)
CO2: 24 mEq/L (ref 19–32)
Chloride: 95 mEq/L — ABNORMAL LOW (ref 96–112)
Creatinine, Ser: 1.14 mg/dL (ref 0.40–1.50)
GFR: 64.27 mL/min (ref >60.00)
GLUCOSE: 91 mg/dL (ref 70–99)
POTASSIUM: 5.2 meq/L — AB (ref 3.5–5.1)
SODIUM: 129 meq/L — AB (ref 135–145)

## 2017-09-18 LAB — VITAMIN B12: VITAMIN B 12: 509 pg/mL (ref 211–911)

## 2017-09-18 LAB — T4, FREE: Free T4: 1.03 ng/dL (ref 0.60–1.60)

## 2017-09-18 LAB — TSH: TSH: 0.16 u[IU]/mL — AB (ref 0.35–4.50)

## 2017-09-18 NOTE — Patient Instructions (Signed)
We will set up physical therapy.Ronald Gutierrez

## 2017-09-18 NOTE — Progress Notes (Signed)
Subjective:     Patient ID: Ronald Gutierrez, male   DOB: [birth year], 84 y.o.   MRN: [id number]  HPI Patient seen with several nonspecific symptoms today including bilateral ear fullness, generalized weakness, increased concern for some recent low blood pressures. His blood pressure usually runs 841Y to 606T systolic and had a couple of days of readings of 105 to 106. He normally plays golf several times weekly but none in the past 2 months. He states he has a sensation that his "head is in a barrel ".  Denies any headache. No vertigo. No chest pain.  No dyspnea. No fevers or chills. No dysuria No facial pain.  No purulent secretions.  He has hypothyroidism recently over replaced. Endocrinology adjusted his levothyroxine dose back in May with recommended repeat levels and one and a half months. He is overdue for that.  He had some recent weight loss and that has now stabilized. His weight is actually up 1 pound from last visit . He had recent CT abdomen and pelvis which showed no acute changes.  Past Medical History:  Diagnosis Date  . ANEMIA   . Colon adenocarcinoma ([place]) 05/2008   T4, N0  . Diverticulitis   . FATIGUE 05/13/2009  . GERD 05/25/2008  . Guillain-Barre syndrome ([place])   . HYPERLIPIDEMIA 05/20/2008  . HYPERTENSION 05/20/2008  . Hypothyroidism   . KERATOSIS 10/21/2008  . Tubular adenoma of colon 10/2009   Past Surgical History:  Procedure Laterality Date  . APPENDECTOMY  1948  . CATARACT EXTRACTION    . CERVICAL LAMINECTOMY  1968  . COLECTOMY  07/2008   transverse  . HEMORRHOID SURGERY    . HERNIA REPAIR     inguinal  . LUMBAR LAMINECTOMY  1970  . OMENTECTOMY      reports that he quit smoking about 49 years ago. His smoking use included cigarettes. He has a 22.50 pack-year smoking history. He has never used smokeless tobacco. He reports that he drinks alcohol. He reports that he does not use drugs. family history includes Heart disease in his brother; Lung cancer in his  brother; Prostate cancer in his brother; Stroke in his father. Allergies  Allergen Reactions  . Ferrous Sulfate     REACTION: Stomach upset     Review of Systems  Constitutional: Positive for fatigue. Negative for chills, fever and unexpected weight change.  Respiratory: Negative for cough and shortness of breath.   Cardiovascular: Negative for chest pain, palpitations and leg swelling.  Gastrointestinal: Negative for abdominal pain, diarrhea, nausea and vomiting.  Genitourinary: Negative for dysuria.       Objective:   Physical Exam  Constitutional: He appears well-developed and well-nourished.  HENT:  Right Ear: External ear normal.  Left Ear: External ear normal.  Mouth/Throat: Oropharynx is clear and moist.  Neck: No thyromegaly present.  Cardiovascular: Normal rate and regular rhythm.  Pulmonary/Chest: Effort normal and breath sounds normal.  Musculoskeletal: He exhibits no edema.       Assessment:     Patient presents with multiple symptoms including generalized weakness, bilateral ear fullness, and feeling somewhat off balance. He does have hypertension history but blood pressures very stable today    Plan:     -check labs with repeat TSH and free T4, B12 level (chronic PPI use), and basic metabolic panel -Set up physical therapy for general strengthening and also to help reduce risk of falls -Stay well-hydrated -consider CT head if symptoms not improving over next few weeks.  Alinda Sierras  Jonne Rote MD [facility] at [facility]

## 2017-09-19 ENCOUNTER — Other Ambulatory Visit: Payer: Self-pay | Admitting: Internal Medicine

## 2017-09-19 DIAGNOSIS — E039 Hypothyroidism, unspecified: Secondary | ICD-10-CM

## 2017-09-19 MED ORDER — LEVOTHYROXINE SODIUM 88 MCG PO TABS: 88.0000 ug | ORAL_TABLET | Freq: Every day | ORAL | 5 refills | Status: DC

## 2017-09-20 ENCOUNTER — Telehealth: Payer: Self-pay | Admitting: Family Medicine

## 2017-09-20 NOTE — Telephone Encounter (Signed)
Provided results per Dr.  Elease Hashimoto on 09/20/17.  Related to Patient that Dr. Elease Hashimoto wants him to reduce his Lisinopril to 20mg  daily.  Scheduled a lab appointment for on   Sept 20,2019 @ 9:00am for repeat BMP Patient voices understanding.

## 2017-09-25 DIAGNOSIS — R69 Illness, unspecified: Secondary | ICD-10-CM | POA: Diagnosis not present

## 2017-09-26 ENCOUNTER — Other Ambulatory Visit: Payer: Self-pay

## 2017-09-26 ENCOUNTER — Ambulatory Visit: Payer: Medicare HMO | Attending: Family Medicine

## 2017-09-26 DIAGNOSIS — M6281 Muscle weakness (generalized): Secondary | ICD-10-CM | POA: Insufficient documentation

## 2017-09-26 DIAGNOSIS — R2689 Other abnormalities of gait and mobility: Secondary | ICD-10-CM | POA: Diagnosis not present

## 2017-09-26 NOTE — Therapy (Signed)
[facility] [address] [place], [place], [address] Phone: [phone]   Fax:  [phone]  Physical Therapy Evaluation  Patient Details  Name: Ronald Gutierrez MRN: [id number] Date of Birth: [birth year] Referring Provider: Carolann Littler, MD   Encounter Date: 09/26/2017  PT End of Session - 09/26/17 0836    Visit Number  1    Date for PT Re-Evaluation  11/21/17    Authorization Type  [employer] Medicare    PT Start Time  0802    PT Stop Time  0839    PT Time Calculation (min)  37 min    Activity Tolerance  Patient tolerated treatment well    Behavior During Therapy  [facility] for tasks assessed/performed       Past Medical History:  Diagnosis Date  . ANEMIA   . Colon adenocarcinoma ([place]) 05/2008   T4, N0  . Diverticulitis   . FATIGUE 05/13/2009  . GERD 05/25/2008  . Guillain-Barre syndrome ([place])   . HYPERLIPIDEMIA 05/20/2008  . HYPERTENSION 05/20/2008  . Hypothyroidism   . KERATOSIS 10/21/2008  . Tubular adenoma of colon 10/2009    Past Surgical History:  Procedure Laterality Date  . APPENDECTOMY  1948  . CATARACT EXTRACTION    . CERVICAL LAMINECTOMY  1968  . COLECTOMY  07/2008   transverse  . HEMORRHOID SURGERY    . HERNIA REPAIR     inguinal  . LUMBAR LAMINECTOMY  1970  . OMENTECTOMY      There were no vitals filed for this visit.   Subjective Assessment - 09/26/17 0802    Subjective  Pt reports that he has gotten progressively weak due to lack of appetite since 01/2017. Pt had a dental procedure and this impacted his taste buds.  Pt has lost 18 lbs since January.  Pt continues to golf 2x/wk with a group.      Pertinent History  Guillian Barre, HTN, colon cancer    Limitations  Walking;Standing    How long can you stand comfortably?  imbalance    How long can you walk comfortably?  imbalance and fatigue with golf    Patient Stated Goals  improve strength, walk better    Currently in Pain?  No/denies          [facility] PT Assessment - 09/26/17 0001      Assessment   Medical Diagnosis  weakness    Referring Provider  Carolann Littler, MD    Onset Date/Surgical Date  01/26/17    Next MD Visit  10/05/17    Prior Therapy  none      Precautions   Precautions  Fall;Other (comment)   history of colon cancer     Restrictions   Weight Bearing Restrictions  No      Balance Screen   Has the patient fallen in the past 6 months  No    Has the patient had a decrease in activity level because of a fear of falling?   No    Is the patient reluctant to leave their home because of a fear of falling?   No      Home Environment   Living Environment  Private residence    Living Arrangements  Spouse/significant other    Type of [place] Access  Level entry      Prior Function   Level of [place]  Retired    Leisure  golf  2x/wk, play cards      Cognition   Overall Cognitive Status  Within Functional Limits for tasks assessed      Observation/Other Assessments   Focus on Therapeutic Outcomes (FOTO)   NA- pt here for balance/strength      Posture/Postural Control   Posture/Postural Control  Postural limitations    Postural Limitations  Rounded Shoulders;Forward head      ROM / Strength   AROM / PROM / Strength  Strength;AROM      AROM   Overall AROM   Within functional limits for tasks performed    Overall AROM Comments  hamstring length is limited with long arc quads      Strength   Overall Strength  Deficits    Overall Strength Comments  Lt shoulder strength 4-/5 due to rotator cuff damage per pt report.  Rt UE 4+/5    Strength Assessment Site  Hip;Knee;Ankle    Right/Left Hip  Right;Left    Right Hip Flexion  4/5    Right Hip ABduction  4/5    Left Hip Flexion  4/5    Left Hip ABduction  4/5    Right/Left Knee  Right;Left    Right Knee Flexion  4/5    Right Knee Extension  4/5    Left Knee Flexion  4/5    Left Knee Extension  4/5     Right/Left Ankle  Right;Left    Right Ankle Dorsiflexion  4+/5    Left Ankle Dorsiflexion  4+/5      Transfers   Transfers  Sit to Stand;Stand to Sit    Sit to Stand  6: Modified independent (Device/Increase time)    Five time sit to stand comments   21.04 seconds    Stand to Sit  6: Modified independent (Device/Increase time)   uncontrolled descent   Comments  uncontrolled descend with stand to sit      Ambulation/Gait   Ambulation/Gait  Yes    Ambulation/Gait Assistance  6: Modified independent (Device/Increase time)    Gait Pattern  Step-through pattern;Decreased stride length;Scissoring;Wide base of support    Gait Comments  instability with change of direction      Balance   Balance Assessed  Yes      High Level Balance   High Level Balance Comments  3 minute walk test: 523 feet- mild shortness of breath that recovered in 1.5 minutes      Functional Gait  Assessment   Gait assessed   Yes                Objective measurements completed on examination: See above findings.              PT Education - 09/26/17 0831    Education Details   Access Code: [email or web address]     Person(s) Educated  Patient    Methods  Explanation;Demonstration;Handout    Comprehension  Verbalized understanding;Returned demonstration       PT Short Term Goals - 09/26/17 0838      PT SHORT TERM GOAL #1   Title  be independent in initial HEP    Time  4    Period  Weeks    Status  New    Target Date  10/24/17      PT SHORT TERM GOAL #2   Title  improved endurance to walk 600 feet in 3 minutes    Time  4    Period  Weeks    Status  New    Target Date  10/24/17      PT SHORT TERM GOAL #3   Title  perform 5x sit to stand in < or = to 17 seconds to improve endurance    Time  4    Period  Weeks    Status  New    Target Date  10/24/17        PT Long Term Goals - 09/26/17 0839      PT LONG TERM GOAL #1   Title  be independent in advanced HEP    Time  8    Period  Weeks     Target Date  11/21/17      PT LONG TERM GOAL #2   Title  ambulate 650 feet in 3 minutes to improve endurance for community ambulation    Time  8    Period  Weeks    Status  New    Target Date  11/21/17      PT LONG TERM GOAL #3   Title  perform 5x sit to stand in < or = to 15 seconds to improve balance    Time  8    Period  Weeks    Status  New    Target Date  11/21/17      PT LONG TERM GOAL #4   Title  demonstrate gait stability with change of direction or rounding a corner to reduce falls risk    Time  8    Period  Weeks    Status  New    Target Date  11/21/17      PT LONG TERM GOAL #5   Title  improve LE strength to perform stand to sit with good eccentric control    Time  8    Period  Weeks    Status  New    Target Date  11/21/17             Plan - 09/26/17 0953    Clinical Impression Statement  Pt presents to PT with complaints of progressive weakness since 01/2017. Pt had an oral surgery and this caused a decrease in appetite.  Pt reports that he golfs regularly and has noticed a decline in his overall endurance and balance. Pt completed 523 feet  in 3 minutes with 3 minute walk test.  Pt performed 5x sit to stand in 21.04 seconds indicating falls risk.  Pt with gross LE strength of 4/5 and demonstrates uncontrolled descent with stand to sit transition.  Pt also demonstrates instability with change of direction with gait.  Pt will benefit from skilled PT for balance, endurance and strength progression to improve safety at home and in the community.       History and Personal Factors relevant to plan of care:  HTN    Clinical Presentation  Stable    Clinical Presentation due to:  weakness over 8 months, good home support, not worsening    Clinical Decision Making  Low    Rehab Potential  Good    PT Frequency  2x / week    PT Duration  8 weeks    PT Treatment/Interventions  ADLs/Self Care Home Management;Balance training;Therapeutic exercise;Functional mobility  training;Therapeutic activities;Stair training;Neuromuscular re-education;Patient/family education;Passive range of motion    PT Next Visit Plan  hamstring flexibility, review HEP, balance and endurance progression.  Pt plans to transfer to [place] at [place]: [phone]  Consulted and Agree with Plan of Care  Patient       Patient will benefit from skilled therapeutic intervention in order to improve the following deficits and impairments:  Impaired flexibility, Decreased balance, Decreased activity tolerance, Decreased endurance, Decreased strength, Decreased mobility, Postural dysfunction  Visit Diagnosis: Muscle weakness (generalized) - Plan: PT plan of care cert/re-cert  Other abnormalities of gait and mobility - Plan: PT plan of care cert/re-cert     Problem List Patient Active Problem List   Diagnosis Date Noted  . History of Guillain-Barre syndrome 12/03/2013  . History of malignant neoplasm of large intestine 09/22/2009  . KERATOSIS 10/21/2008  . 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 GERD 05/25/2008  . PERSONAL HX COLONIC POLYPS 05/25/2008  . Hypothyroidism 05/20/2008  . Hyperlipidemia 05/20/2008  . ANEMIA 05/20/2008  . Essential hypertension 05/20/2008     Sigurd Sos, PT 09/26/17 10:13 AM  [facility] [address], [place], [place], [address] Phone: [phone]   Fax:  [phone]  Name: Ronald Gutierrez MRN: [id number] Date of Birth: June 20, 1926

## 2017-09-26 NOTE — Patient Instructions (Signed)
Access Code: [email or web address]  URL: [email or web address]  Date: 09/26/2017  Prepared by: Sigurd Sos   Exercises  Seated Long Arc Quad - 10 reps - 2 sets - 5 hold - 3x daily - 7x weekly  Seated March - 10 reps - 3 sets - 3x daily - 7x weekly  Seated Heel Toe Raises - 10 reps - 2 sets - 3x daily - 7x weekly  Seated Heel Raise - 10 reps - 2 sets - 3x daily - 7x weekly  Seated Isometric Hip Adduction with Ball - 10 reps - 2 sets - 3x daily - 7x weekly

## 2017-09-28 ENCOUNTER — Ambulatory Visit: Payer: Self-pay | Admitting: Family Medicine

## 2017-09-28 NOTE — Telephone Encounter (Signed)
ret'd call to pt's wife.  Discussed recommendation for pt. To decrease Lisinopril to 20 mg dose, after last BMP on 09/18/17.  Wife stated she has been cutting his 40 mg. tablet in 1/2, but questioned if he should have had a new Rx called in for the lower dose.  Noted that Dr. Elease Hashimoto recommended pt. To return in 2 weeks and to repeat BMP at that time.  Advised wife that new Rx has not been ordered; suggested that we will await the results of the repeat lab work in 2 weeks, before changing the prescription.  Questioned if wife is comfortable cutting the tablet in 1/2; reported she is.  Enc. to keep pt. on the Lisinopril 20 mg. Daily, until further recommendation.  Also, scheduled pt. for a 2 week f/u appt. with Dr. Elease Hashimoto.  Pt. has both lab appt. And PCP appt. on 10/05/17.  Verb. understanding and agreed with plan.          Reason for Disposition . Health Information question, no triage required and triager able to answer question  Answer Assessment - Initial Assessment Questions 1. REASON FOR CALL or QUESTION: "What is your reason for calling today?" or "How can I best help you?" or "What question do you have that I can help answer?"     Wife questioned if a new Rx was supposed to be called in for Lisinopril 20 mg.  Stated she has been cutting the 40 mg. tablet in half, for him to take the 20 mg dose.  Stated she is comfortable doing this, and just picked up a 90 day supply of the 40 mg. Tablets.  Protocols used: INFORMATION ONLY CALL-A-AH

## 2017-10-01 ENCOUNTER — Other Ambulatory Visit: Payer: Self-pay

## 2017-10-01 MED ORDER — SIMVASTATIN 20 MG PO TABS: 20.0000 mg | ORAL_TABLET | Freq: Every day | ORAL | 3 refills | Status: DC

## 2017-10-01 MED ORDER — LISINOPRIL 20 MG PO TABS: 20.0000 mg | ORAL_TABLET | Freq: Every day | ORAL | 1 refills | Status: DC

## 2017-10-05 ENCOUNTER — Other Ambulatory Visit (INDEPENDENT_AMBULATORY_CARE_PROVIDER_SITE_OTHER): Payer: Medicare HMO

## 2017-10-05 ENCOUNTER — Other Ambulatory Visit: Payer: Self-pay

## 2017-10-05 ENCOUNTER — Ambulatory Visit (INDEPENDENT_AMBULATORY_CARE_PROVIDER_SITE_OTHER): Payer: Medicare HMO | Admitting: Family Medicine

## 2017-10-05 ENCOUNTER — Encounter: Payer: Self-pay | Admitting: Family Medicine

## 2017-10-05 VITALS — BP 128/74 | HR 63 | Temp 97.7°F | Wt 173.2 lb

## 2017-10-05 DIAGNOSIS — I10 Essential (primary) hypertension: Secondary | ICD-10-CM

## 2017-10-05 DIAGNOSIS — E871 Hypo-osmolality and hyponatremia: Secondary | ICD-10-CM | POA: Diagnosis not present

## 2017-10-05 DIAGNOSIS — R531 Weakness: Secondary | ICD-10-CM | POA: Diagnosis not present

## 2017-10-05 LAB — BASIC METABOLIC PANEL
BUN: 21 mg/dL (ref 6–23)
CALCIUM: 8.9 mg/dL (ref 8.4–10.5)
CO2: 26 mEq/L (ref 19–32)
CREATININE: 1.05 mg/dL (ref 0.40–1.50)
Chloride: 100 mEq/L (ref 96–112)
GFR: 70.66 mL/min (ref >60.00)
Glucose, Bld: 102 mg/dL — ABNORMAL HIGH (ref 70–99)
Potassium: 4.7 mEq/L (ref 3.5–5.1)
Sodium: 133 mEq/L — ABNORMAL LOW (ref 135–145)

## 2017-10-05 NOTE — Progress Notes (Signed)
Subjective:     Patient ID: Ronald Gutierrez, male   DOB: 1926/10/28, 84 y.o.   MRN: [id number]  HPI   Patient seen for follow-up regarding recent generalized weakness and fatigue. We obtained labs and his B12 was normal. Sodium 129.  Thyroid slightly over replaced and adjustment in dosage per endocrinology. Patient had been playing golf out in the heat considerably. We reduced his dosage of lisinopril from 40 to 20. We set up physical therapy and he has been for couple times now. They're working mostly on lower extremity strengthening. He also liberalized his salt intake slightly. He feels much better at this time. No dizziness. No chest pains. Denies any nausea or vomiting.  He also had slightly elevated potassium of 5.2. We suspected hemolyzed.  Past Medical History:  Diagnosis Date  . ANEMIA   . Colon adenocarcinoma ([place]) 05/2008   T4, N0  . Diverticulitis   . FATIGUE 05/13/2009  . GERD 05/25/2008  . Guillain-Barre syndrome ([place])   . HYPERLIPIDEMIA 05/20/2008  . HYPERTENSION 05/20/2008  . Hypothyroidism   . KERATOSIS 10/21/2008  . Tubular adenoma of colon 10/2009   Past Surgical History:  Procedure Laterality Date  . APPENDECTOMY  1948  . CATARACT EXTRACTION    . CERVICAL LAMINECTOMY  1968  . COLECTOMY  07/2008   transverse  . HEMORRHOID SURGERY    . HERNIA REPAIR     inguinal  . LUMBAR LAMINECTOMY  1970  . OMENTECTOMY      reports that he quit smoking about 49 years ago. His smoking use included cigarettes. He has a 22.50 pack-year smoking history. He has never used smokeless tobacco. He reports that he drinks alcohol. He reports that he does not use drugs. family history includes Heart disease in his brother; Lung cancer in his brother; Prostate cancer in his brother; Stroke in his father. Allergies  Allergen Reactions  . Ferrous Sulfate     REACTION: Stomach upset     Review of Systems  Constitutional: Negative for fatigue.  Eyes: Negative for visual disturbance.   Respiratory: Negative for cough, chest tightness and shortness of breath.   Cardiovascular: Negative for chest pain, palpitations and leg swelling.  Endocrine: Negative for polydipsia and polyuria.  Neurological: Negative for dizziness, syncope, weakness, light-headedness and headaches.       Objective:   Physical Exam  Constitutional: He is oriented to person, place, and time. He appears well-developed and well-nourished.  HENT:  Right Ear: External ear normal.  Left Ear: External ear normal.  Mouth/Throat: Oropharynx is clear and moist.  Eyes: Pupils are equal, round, and reactive to light.  Neck: Neck supple. No thyromegaly present.  Cardiovascular: Normal rate and regular rhythm.  Pulmonary/Chest: Effort normal and breath sounds normal. No respiratory distress. He has no wheezes. He has no rales.  Musculoskeletal: He exhibits no edema.  Neurological: He is alert and oriented to person, place, and time.       Assessment:     #1 hyponatremia improved by labs this morning with sodium now 133. Repeat potassium normal at 4.7  #2 generalized weakness overall improved  #3 hypertension stable with recent reduction in lisinopril from 40-20 mg daily    Plan:     -continue good hydration -Continue physical therapy for balance training and general strengthening -Cautioned again to try to avoid peak times of day for heat while golfing. -Routine follow-up in 3 months. Follow-up sooner for any consistent blood pressures over 409W systolic  Eulas Post MD  [facility]

## 2017-10-09 ENCOUNTER — Ambulatory Visit: Payer: Medicare HMO

## 2017-10-09 DIAGNOSIS — D1721 Benign lipomatous neoplasm of skin and subcutaneous tissue of right arm: Secondary | ICD-10-CM | POA: Diagnosis not present

## 2017-10-09 DIAGNOSIS — L821 Other seborrheic keratosis: Secondary | ICD-10-CM | POA: Diagnosis not present

## 2017-10-09 DIAGNOSIS — Z85828 Personal history of other malignant neoplasm of skin: Secondary | ICD-10-CM | POA: Diagnosis not present

## 2017-10-09 DIAGNOSIS — D171 Benign lipomatous neoplasm of skin and subcutaneous tissue of trunk: Secondary | ICD-10-CM | POA: Diagnosis not present

## 2017-10-09 DIAGNOSIS — C44319 Basal cell carcinoma of skin of other parts of face: Secondary | ICD-10-CM | POA: Diagnosis not present

## 2017-10-09 DIAGNOSIS — D485 Neoplasm of uncertain behavior of skin: Secondary | ICD-10-CM | POA: Diagnosis not present

## 2017-11-21 DIAGNOSIS — C44319 Basal cell carcinoma of skin of other parts of face: Secondary | ICD-10-CM | POA: Diagnosis not present

## 2017-11-21 DIAGNOSIS — Z85828 Personal history of other malignant neoplasm of skin: Secondary | ICD-10-CM | POA: Diagnosis not present

## 2017-12-19 ENCOUNTER — Other Ambulatory Visit: Payer: Self-pay | Admitting: Family Medicine

## 2018-02-24 ENCOUNTER — Other Ambulatory Visit: Payer: Self-pay | Admitting: Family Medicine

## 2018-03-22 ENCOUNTER — Ambulatory Visit: Payer: Medicare HMO | Admitting: Internal Medicine

## 2018-03-22 ENCOUNTER — Other Ambulatory Visit: Payer: Self-pay

## 2018-03-22 ENCOUNTER — Encounter: Payer: Self-pay | Admitting: Internal Medicine

## 2018-03-22 VITALS — BP 122/60 | HR 74 | Ht 67.5 in | Wt 171.0 lb

## 2018-03-22 DIAGNOSIS — E039 Hypothyroidism, unspecified: Secondary | ICD-10-CM

## 2018-03-22 LAB — T4, FREE: Free T4: 1.25 ng/dL (ref 0.60–1.60)

## 2018-03-22 LAB — TSH: TSH: 0.63 u[IU]/mL (ref 0.35–4.50)

## 2018-03-22 MED ORDER — LEVOTHYROXINE SODIUM 88 MCG PO TABS: 88.0000 ug | ORAL_TABLET | Freq: Every day | ORAL | 3 refills | Status: DC

## 2018-03-22 NOTE — Patient Instructions (Signed)
Please stop at the lab.  Please continue levothyroxine 88 mcg daily  Take the thyroid hormone every day, with water, at least 30 minutes before breakfast, separated by at least 4 hours from: - acid reflux medications - calcium - iron - multivitamins  Please come back for a follow-up appointment in 1 year.

## 2018-03-22 NOTE — Progress Notes (Signed)
Patient ID: Ronald Gutierrez, male   DOB: 1926/10/10, 85 y.o.   MRN: [id number]   HPI  Ronald Gutierrez is a 85 y.o.-year-old male, returning for f/u for hypothyroidism, dx 2010. Last visit 1 year ago.  Reviewed and addended history: Pt has a history of poorly controlled hypothyroidism, with previous TSH levels fluctuating, but mostly elevated due to taking levothyroxine along with Orange juice, multivitamins, PPIs.    After we separated his levothyroxine from his supplements and medications, his tests improved to the point of a suppressed TSH, for which we had to decrease his levothyroxine dose from 137 mcg down.  Since last visit, we continued to decrease his levothyroxine dose as his TFTs repeatedly returned suppressed.  Last dose change was 09/2017.  Pt is on levothyroxine 88 Mcg daily, taken: - in am - fasting - + coffee + creamer - at least 1.5 hours from b'fast - no Ca, Fe - + MVI, PPIs later in the day - not on Biotin  Reviewed patient's TFTs: Component Value Date   TSH 0.16 (L) 09/18/2017   TSH 0.02 (L) 06/12/2017   TSH <0.01 Repeated and verified X2. (L) 03/21/2017   TSH 0.01 (L) 03/22/2016   TSH 4.83 (H) 05/11/2015   TSH 8.76 (H) 03/23/2015   TSH 1.99 11/03/2014   TSH 3.70 09/22/2014   TSH 7.13 (H) 03/17/2014   TSH 8.85 (H) 10/28/2013   FREET4 1.03 09/18/2017   FREET4 1.41 06/12/2017   FREET4 1.43 03/21/2017   FREET4 1.53 03/22/2016   FREET4 1.40 05/11/2015   FREET4 1.38 03/23/2015   FREET4 1.17 09/22/2014   FREET4 1.32 03/17/2014   FREET4 1.13 10/28/2013   FREET4 1.85 (H) 09/16/2013    Pt denies: - feeling nodules in neck - hoarseness - choking - SOB with lying down But has chronic dysphagia.  He also has a history of colon cancer (s/p colectomy in 2010), HL, HTN, GERD.  No FH of thyroid cancer. No h/o radiation tx to head or neck.  No herbal supplements. No Biotin use. No recent steroids use.   He was not golfing for 3 weeks >> restarted yesterday >> felt  more fatigued. He organizes a Aeronautical engineer.  ROS: Constitutional: no weight gain/no weight loss, no fatigue, no subjective hyperthermia, no subjective hypothermia Eyes: no blurry vision, no xerophthalmia ENT: no sore throat, + see HPI Cardiovascular: no CP/no SOB/no palpitations/no leg swelling Respiratory: no cough/no SOB/no wheezing Gastrointestinal: no N/no V/no D/no C/no acid reflux Musculoskeletal: no muscle aches/no joint aches Skin: no rashes, no hair loss Neurological: no tremors/no numbness/no tingling/no dizziness  I reviewed pt's medications, allergies, PMH, social hx, family hx, and changes were documented in the history of present illness. Otherwise, unchanged from my initial visit note. Past Medical History:  Diagnosis Date  . ANEMIA   . Colon adenocarcinoma ([place]) 05/2008   T4, N0  . Diverticulitis   . FATIGUE 05/13/2009  . GERD 05/25/2008  . Guillain-Barre syndrome ([place])   . HYPERLIPIDEMIA 05/20/2008  . HYPERTENSION 05/20/2008  . Hypothyroidism   . KERATOSIS 10/21/2008  . Tubular adenoma of colon 10/2009   Past Surgical History:  Procedure Laterality Date  . APPENDECTOMY  1948  . CATARACT EXTRACTION    . CERVICAL LAMINECTOMY  1968  . COLECTOMY  07/2008   transverse  . HEMORRHOID SURGERY    . HERNIA REPAIR     inguinal  . LUMBAR LAMINECTOMY  1970  . OMENTECTOMY     Social History   Socioeconomic History  .  Marital status: Married    Spouse name: Not on file  . Number of children: Not on file  . Years of education: Not on file  . Highest education level: Not on file  Occupational History  . Occupation: Retired    Fish farm manager: RETIRED  Social Needs  . Financial resource strain: Not on file  . Food insecurity:    Worry: Not on file    Inability: Not on file  . Transportation needs:    Medical: Not on file    Non-medical: Not on file  Tobacco Use  . Smoking status: Former Smoker    Packs/day: 1.50    Years: 15.00    Pack years: 22.50    Types: Cigarettes     Last attempt to quit: 01/17/1968    Years since quitting: 50.2  . Smokeless tobacco: Never Used  . Tobacco comment: aged out of preventive screens  Substance and Sexual Activity  . Alcohol use: Yes  . Drug use: No  . Sexual activity: Not on file  Lifestyle  . Physical activity:    Days per week: Not on file    Minutes per session: Not on file  . Stress: Not on file  Relationships  . Social connections:    Talks on phone: Not on file    Gets together: Not on file    Attends religious service: Not on file    Active member of club or organization: Not on file    Attends meetings of clubs or organizations: Not on file    Relationship status: Not on file  . Intimate partner violence:    Fear of current or ex partner: Not on file    Emotionally abused: Not on file    Physically abused: Not on file    Forced sexual activity: Not on file  Other Topics Concern  . Not on file  Social History Narrative  . Not on file   Current Outpatient Medications on File Prior to Visit  Medication Sig Dispense Refill  . aspirin 81 MG tablet Take 81 mg by mouth daily.    Marland Kitchen azelastine (ASTELIN) 0.1 % nasal spray Place 1 spray into both nostrils 2 (two) times daily. Use in each nostril as directed 30 mL 12  . fexofenadine (ALLEGRA) 180 MG tablet Take 180 mg by mouth daily.    Marland Kitchen levothyroxine (SYNTHROID, LEVOTHROID) 88 MCG tablet Take 1 tablet (88 mcg total) by mouth daily with breakfast. 45 tablet 5  . lisinopril (PRINIVIL,ZESTRIL) 20 MG tablet Take 1 tablet (20 mg total) by mouth daily. 90 tablet 1  . Multiple Vitamins-Minerals (CENTRUM ULTRA MENS PO) Take 1 tablet by mouth daily.      Marland Kitchen omeprazole (PRILOSEC) 20 MG capsule TAKE ONE CAPSULE BY MOUTH DAILY 90 capsule 0  . simvastatin (ZOCOR) 20 MG tablet Take 1 tablet (20 mg total) by mouth at bedtime. 90 tablet 3  . simvastatin (ZOCOR) 40 MG tablet TAKE 1/2 TABLET BY MOUTH DAILY 45 tablet 0  . triamcinolone (NASACORT ALLERGY 24HR) 55 MCG/ACT AERO  nasal inhaler Place 2 sprays into the nose daily.     No current facility-administered medications on file prior to visit.    Allergies  Allergen Reactions  . Ferrous Sulfate     REACTION: Stomach upset   Family History  Problem Relation Age of Onset  . Lung cancer Brother   . Heart disease Brother   . Prostate cancer Brother   . Stroke Father    PE: BP  122/60   Pulse 74   Ht 5' 7.5" (1.715 m) Comment: measured without shoes  Wt 171 lb (77.6 kg)   SpO2 95%   BMI 26.39 kg/m  Wt Readings from Last 3 Encounters:  03/22/18 171 lb (77.6 kg)  10/05/17 173 lb 3.2 oz (78.6 kg)  09/18/17 173 lb 4.8 oz (78.6 kg)   Constitutional: normal weight, in NAD Eyes: PERRLA, EOMI, no exophthalmos ENT: moist mucous membranes, no thyromegaly, no cervical lymphadenopathy Cardiovascular: RRR, No MRG Respiratory: CTA B Gastrointestinal: abdomen soft, NT, ND, BS+ Musculoskeletal: no deformities, strength intact in all 4 Skin: moist, warm, no rashes Neurological: no tremor with outstretched hands, DTR undetectable all 4 (h/o Guillain-Barre)  ASSESSMENT: 1. Hypothyroidism - uncontrolled  PLAN:  1.  Patient with long history of hypothyroidism, uncontrolled, on levothyroxine therapy, with fluctuating TFTs, initially due to not taking his medication correctly.  In the last 3 years, he started to take his medications correctly and his TFTs were improving to the point of a suppressed TSH.  We had to keep decreasing the doses of his levothyroxine from an original 137 mcg daily to a current dose of 88 mcg daily.  Last dose change 09/2017. - latest thyroid labs reviewed with pt >> TSH improved, but still suppressed in 09/2017 - he continues on LT4 88 mcg daily - pt feels good on this dose. - we discussed about taking the thyroid hormone every day, with water, >30 minutes before breakfast, separated by >4 hours from acid reflux medications, calcium, iron, multivitamins. Pt. is taking it correctly. - will  check thyroid tests today: TSH and fT4 - If labs are abnormal, he will need to return for repeat TFTs in 1.5 months  Needs refills  - time spent with the patient: 15 min, of which >50% was spent in obtaining information about his symptoms, reviewing his previous labs, evaluations, and treatments, counseling him about his condition (please see the discussed topics above), and developing a plan to further investigate and treat it.  Office Visit on 03/22/2018  Component Date Value Ref Range Status  . TSH 03/22/2018 0.63  0.35 - 4.50 uIU/mL Final  . Free T4 03/22/2018 1.25  0.60 - 1.60 ng/dL Final   Comment: Specimens from patients who are undergoing biotin therapy and /or ingesting biotin supplements may contain high levels of biotin.  The higher biotin concentration in these specimens interferes with this Free T4 assay.  Specimens that contain high levels  of biotin may cause false high results for this Free T4 assay.  Please interpret results in light of the total clinical presentation of the patient.     Normal TFTs.  Philemon Kingdom, MD PhD [facility] Endocrinology

## 2018-04-02 DIAGNOSIS — R69 Illness, unspecified: Secondary | ICD-10-CM | POA: Diagnosis not present

## 2018-05-25 ENCOUNTER — Other Ambulatory Visit: Payer: Self-pay | Admitting: Family Medicine

## 2018-05-27 ENCOUNTER — Other Ambulatory Visit: Payer: Self-pay

## 2018-05-27 ENCOUNTER — Ambulatory Visit (INDEPENDENT_AMBULATORY_CARE_PROVIDER_SITE_OTHER): Payer: Medicare HMO | Admitting: Family Medicine

## 2018-05-27 DIAGNOSIS — K219 Gastro-esophageal reflux disease without esophagitis: Secondary | ICD-10-CM | POA: Diagnosis not present

## 2018-05-27 DIAGNOSIS — I10 Essential (primary) hypertension: Secondary | ICD-10-CM | POA: Diagnosis not present

## 2018-05-27 DIAGNOSIS — E785 Hyperlipidemia, unspecified: Secondary | ICD-10-CM | POA: Diagnosis not present

## 2018-05-27 MED ORDER — OMEPRAZOLE 20 MG PO CPDR: 20.0000 mg | DELAYED_RELEASE_CAPSULE | Freq: Every day | ORAL | 3 refills | Status: DC

## 2018-05-27 MED ORDER — LISINOPRIL 20 MG PO TABS: 20.0000 mg | ORAL_TABLET | Freq: Every day | ORAL | 3 refills | Status: DC

## 2018-05-27 NOTE — Telephone Encounter (Signed)
Patient has a Doxy appointment today at 4:15pm for medication refill

## 2018-05-27 NOTE — Progress Notes (Signed)
Patient ID: Ronald Gutierrez, male   DOB: 07/06/1926, 85 y.o.   MRN: [id number]  This visit type was conducted due to national recommendations for restrictions regarding the COVID-19 pandemic in an effort to limit this patient's exposure and mitigate transmission in our community.   Virtual Visit via Video Note  I connected with [name] on 05/27/18 at  4:15 PM EDT by a video enabled telemedicine application and verified that I am speaking with the correct person using two identifiers.  Location patient: home Location provider:work or home office Persons participating in the virtual visit: patient, provider  I discussed the limitations of evaluation and management by telemedicine and the availability of in person appointments. The patient expressed understanding and agreed to proceed.   HPI: Has chronic problems including hypertension, history of colon cancer, GERD, hypothyroidism, hyperlipidemia, and history of Guillain- Barr syndrome.  He states he feels good at this time.  In fact, he shot just 2 strokes over [place] today on 18 holes.  He is still playing golf a few times per week.  He is taking great measures to keep social distancing.  Hypertension treated with lisinopril.  Needs refills.  Blood pressure today 124/69 with pulse of 68.  No side effects from medication.  This blood pressure is typical of recent readings.  No recent chest pains.  History of GERD.  Takes omeprazole 20 mg daily.  This is controlling his reflux symptoms.  No dysphasia.  No appetite or weight changes.  Hypothyroidism followed by endocrinology.  Patient has hyperlipidemia and is on simvastatin.  His lipids were checked last June.  No myalgias.   ROS: See pertinent positives and negatives per HPI.  Past Medical History:  Diagnosis Date  . ANEMIA   . Colon adenocarcinoma ([place]) 05/2008   T4, N0  . Diverticulitis   . FATIGUE 05/13/2009  . GERD 05/25/2008  . Guillain-Barre syndrome ([place])   . HYPERLIPIDEMIA  05/20/2008  . HYPERTENSION 05/20/2008  . Hypothyroidism   . KERATOSIS 10/21/2008  . Tubular adenoma of colon 10/2009    Past Surgical History:  Procedure Laterality Date  . APPENDECTOMY  1948  . CATARACT EXTRACTION    . CERVICAL LAMINECTOMY  1968  . COLECTOMY  07/2008   transverse  . HEMORRHOID SURGERY    . HERNIA REPAIR     inguinal  . LUMBAR LAMINECTOMY  1970  . OMENTECTOMY      Family History  Problem Relation Age of Onset  . Lung cancer Brother   . Heart disease Brother   . Prostate cancer Brother   . Stroke Father     SOCIAL HX: Non-smoker.  He is married.  Avid golfer   Current Outpatient Medications:  .  aspirin 81 MG tablet, Take 81 mg by mouth daily., Disp: , Rfl:  .  azelastine (ASTELIN) 0.1 % nasal spray, Place 1 spray into both nostrils 2 (two) times daily. Use in each nostril as directed, Disp: 30 mL, Rfl: 12 .  fexofenadine (ALLEGRA) 180 MG tablet, Take 180 mg by mouth daily., Disp: , Rfl:  .  levothyroxine (SYNTHROID, LEVOTHROID) 88 MCG tablet, Take 1 tablet (88 mcg total) by mouth daily before breakfast., Disp: 90 tablet, Rfl: 3 .  lisinopril (ZESTRIL) 20 MG tablet, Take 1 tablet (20 mg total) by mouth daily., Disp: 90 tablet, Rfl: 3 .  Multiple Vitamins-Minerals (CENTRUM ULTRA MENS PO), Take 1 tablet by mouth daily.  , Disp: , Rfl:  .  omeprazole (PRILOSEC) 20 MG capsule, Take  1 capsule (20 mg total) by mouth daily., Disp: 90 capsule, Rfl: 3 .  simvastatin (ZOCOR) 20 MG tablet, Take 1 tablet (20 mg total) by mouth at bedtime., Disp: 90 tablet, Rfl: 3 .  triamcinolone (NASACORT ALLERGY 24HR) 55 MCG/ACT AERO nasal inhaler, Place 2 sprays into the nose daily., Disp: , Rfl:   EXAM:  VITALS per patient if applicable:  GENERAL: alert, oriented, appears well and in no acute distress  HEENT: atraumatic, conjunttiva clear, no obvious abnormalities on inspection of external nose and ears  NECK: normal movements of the head and neck  LUNGS: on inspection no signs  of respiratory distress, breathing rate appears normal, no obvious gross SOB, gasping or wheezing  CV: no obvious cyanosis  MS: moves all visible extremities without noticeable abnormality  PSYCH/NEURO: pleasant and cooperative, no obvious depression or anxiety, speech and thought processing grossly intact  ASSESSMENT AND PLAN:  Discussed the following assessment and plan:  #1 hypertension stable by home readings -Refill lisinopril for 1 year  #2 GERD stable on omeprazole -Refill omeprazole for 1 year  #3 hyperlipidemia -Continue simvastatin.  We discussed possibly repeating lipids in about 3 to 4 months and hopefully can get office visit in at that point     I discussed the assessment and treatment plan with the patient. The patient was provided an opportunity to ask questions and all were answered. The patient agreed with the plan and demonstrated an understanding of the instructions.   The patient was advised to call back or seek an in-person evaluation if the symptoms worsen or if the condition fails to improve as anticipated.     Carolann Littler, MD

## 2018-05-29 NOTE — Telephone Encounter (Signed)
I see the 20 mg in medication list.   OK to send in the 40 mg per instructions for half tablet daily or better to keep 20 mg?

## 2018-05-29 NOTE — Telephone Encounter (Signed)
OK to send in the 40 mg if they prefer.

## 2018-08-21 ENCOUNTER — Ambulatory Visit: Payer: Self-pay | Admitting: Family Medicine

## 2018-08-21 NOTE — Telephone Encounter (Signed)
Called patient and he has an appointment on Friday at [place] in office

## 2018-08-21 NOTE — Telephone Encounter (Signed)
Do you want this patient to come in office for visit?

## 2018-08-21 NOTE — Telephone Encounter (Signed)
Yes.  Set up 30 minute office follow up.

## 2018-08-21 NOTE — Telephone Encounter (Signed)
Pt. Reports he has had abdominal pain x 2-3 weeks. Hurts upper left side near his rib cage. Becoming worse. Pain is 7/10. No change in bowel habits or fever. States he has had colon cancer in the past so he is concerned. Warm transfer to [facility] in the practice.  Answer Assessment - Initial Assessment Questions 1. LOCATION: "Where does it hurt?"      Left side near his ribs 2. RADIATION: "Does the pain shoot anywhere else?" (e.g., chest, back)     Yes to back 3. ONSET: "When did the pain begin?" (Minutes, hours or days ago)      2-3 weeks ago 4. SUDDEN: "Gradual or sudden onset?"     Gradual 5. PATTERN "Does the pain come and go, or is it constant?"    - If constant: "Is it getting better, staying the same, or worsening?"      (Note: Constant means the pain never goes away completely; most serious pain is constant and it progresses)     - If intermittent: "How long does it last?" "Do you have pain now?"     (Note: Intermittent means the pain goes away completely between bouts)     Constant 6. SEVERITY: "How bad is the pain?"  (e.g., Scale 1-10; mild, moderate, or severe)    - MILD (1-3): doesn't interfere with normal activities, abdomen soft and not tender to touch     - MODERATE (4-7): interferes with normal activities or awakens from sleep, tender to touch     - SEVERE (8-10): excruciating pain, doubled over, unable to do any normal activities       7 7. RECURRENT SYMPTOM: "Have you ever had this type of abdominal pain before?" If so, ask: "When was the last time?" and "What happened that time?"      No 8. CAUSE: "What do you think is causing the abdominal pain?"     Unsure 9. RELIEVING/AGGRAVATING FACTORS: "What makes it better or worse?" (e.g., movement, antacids, bowel movement)     No 10. OTHER SYMPTOMS: "Has there been any vomiting, diarrhea, constipation, or urine problems?"       Weak  Protocols used: ABDOMINAL PAIN - MALE-A-AH

## 2018-08-23 ENCOUNTER — Ambulatory Visit: Payer: Medicare HMO

## 2018-08-23 ENCOUNTER — Ambulatory Visit (INDEPENDENT_AMBULATORY_CARE_PROVIDER_SITE_OTHER): Payer: Medicare HMO | Admitting: Family Medicine

## 2018-08-23 ENCOUNTER — Encounter: Payer: Self-pay | Admitting: Family Medicine

## 2018-08-23 ENCOUNTER — Other Ambulatory Visit: Payer: Self-pay

## 2018-08-23 VITALS — BP 110/70 | HR 70 | Temp 97.9°F | Ht 67.5 in | Wt 173.6 lb

## 2018-08-23 DIAGNOSIS — R05 Cough: Secondary | ICD-10-CM

## 2018-08-23 DIAGNOSIS — R1012 Left upper quadrant pain: Secondary | ICD-10-CM

## 2018-08-23 DIAGNOSIS — C184 Malignant neoplasm of transverse colon: Secondary | ICD-10-CM

## 2018-08-23 DIAGNOSIS — R059 Cough, unspecified: Secondary | ICD-10-CM

## 2018-08-23 DIAGNOSIS — R531 Weakness: Secondary | ICD-10-CM

## 2018-08-23 LAB — CBC WITH DIFFERENTIAL/PLATELET
Basophils Absolute: 0 10*3/uL (ref 0.0–0.1)
Basophils Relative: 0.6 % (ref 0.0–3.0)
Eosinophils Absolute: 0.3 10*3/uL (ref 0.0–0.7)
Eosinophils Relative: 4.1 % (ref 0.0–5.0)
HCT: 36.8 % — ABNORMAL LOW (ref 39.0–52.0)
Hemoglobin: 12.5 g/dL — ABNORMAL LOW (ref 13.0–17.0)
Lymphocytes Relative: 18.2 % (ref 12.0–46.0)
Lymphs Abs: 1.3 10*3/uL (ref 0.7–4.0)
MCHC: 33.8 g/dL (ref 30.0–36.0)
MCV: 103 fl — ABNORMAL HIGH (ref 78.0–100.0)
Monocytes Absolute: 0.8 10*3/uL (ref 0.1–1.0)
Monocytes Relative: 10.9 % (ref 3.0–12.0)
Neutro Abs: 4.6 10*3/uL (ref 1.4–7.7)
Neutrophils Relative %: 66.2 % (ref 43.0–77.0)
Platelets: 289 10*3/uL (ref 150.0–400.0)
RBC: 3.58 Mil/uL — ABNORMAL LOW (ref 4.22–5.81)
RDW: 13.1 % (ref 11.5–15.5)
WBC: 7 10*3/uL (ref 4.0–10.5)

## 2018-08-23 LAB — POCT URINALYSIS DIPSTICK
Bilirubin, UA: NEGATIVE
Blood, UA: NEGATIVE
Glucose, UA: NEGATIVE
Ketones, UA: NEGATIVE
Leukocytes, UA: NEGATIVE
Nitrite, UA: NEGATIVE
Odor: NEGATIVE
Protein, UA: NEGATIVE
Spec Grav, UA: 1.015 (ref 1.010–1.025)
Urobilinogen, UA: 0.2 E.U./dL
pH, UA: 6 (ref 5.0–8.0)

## 2018-08-23 LAB — COMPREHENSIVE METABOLIC PANEL
ALT: 11 U/L (ref 0–53)
AST: 17 U/L (ref 0–37)
Albumin: 4.1 g/dL (ref 3.5–5.2)
Alkaline Phosphatase: 74 U/L (ref 39–117)
BUN: 25 mg/dL — ABNORMAL HIGH (ref 6–23)
CO2: 26 mEq/L (ref 19–32)
Calcium: 9.4 mg/dL (ref 8.4–10.5)
Chloride: 97 mEq/L (ref 96–112)
Creatinine, Ser: 1.16 mg/dL (ref 0.40–1.50)
GFR: 59.14 mL/min — ABNORMAL LOW (ref >60.00)
Glucose, Bld: 101 mg/dL — ABNORMAL HIGH (ref 70–99)
Potassium: 4.9 mEq/L (ref 3.5–5.1)
Sodium: 130 mEq/L — ABNORMAL LOW (ref 135–145)
Total Bilirubin: 0.6 mg/dL (ref 0.2–1.2)
Total Protein: 7.2 g/dL (ref 6.0–8.3)

## 2018-08-23 NOTE — Patient Instructions (Signed)
Reduce the Lisinopril 20 mg to one half tablet daily.

## 2018-08-23 NOTE — Progress Notes (Signed)
Subjective:     Patient ID: Ronald Gutierrez, male   DOB: 21-Jun-1926, 85 y.o.   MRN: [id number]  HPI Patient is seen with multiple issues today.  He has history of hypertension, past history of colon cancer, GERD, hypothyroidism, hyperlipidemia.  He had surgery for colon cancer about 10 years ago.  He has remote history of Guillain-Barr syndrome.  He is generally very active and has still been playing golf 2 days/week but recently had decreased stamina and weakness.  He is noted about 2 months of progressive fatigue and weakness.  He struggled to play 9 holes of golf earlier this week.  He states his appetite is "okay "but wife thinks this is diminished.  He has not had any documented weight loss.  He has had some mild dyspnea with walking but no chest pain.  He states he feels very similar to the way he felt when he had anemia when his colon cancer was diagnosed.  He has had some left upper quadrant abdominal pains intermittently.  No change in bowel habits.  Denies any fever or chills.  Occasional chronic cough.  Quit smoking age 78.  He had CT abdomen pelvis last July (2019) with no acute findings.  Denies any recent nausea or vomiting.  He is on lisinopril 20 mg daily for hypertension.  Denies any consistent orthostatic symptoms but does have some lightheadedness intermittently.  He has been conscious to drink plenty of fluids  Past Medical History:  Diagnosis Date  . ANEMIA   . Colon adenocarcinoma ([place]) 05/2008   T4, N0  . Diverticulitis   . FATIGUE 05/13/2009  . GERD 05/25/2008  . Guillain-Barre syndrome ([place])   . HYPERLIPIDEMIA 05/20/2008  . HYPERTENSION 05/20/2008  . Hypothyroidism   . KERATOSIS 10/21/2008  . Tubular adenoma of colon 10/2009   Past Surgical History:  Procedure Laterality Date  . APPENDECTOMY  1948  . CATARACT EXTRACTION    . CERVICAL LAMINECTOMY  1968  . COLECTOMY  07/2008   transverse  . HEMORRHOID SURGERY    . HERNIA REPAIR     inguinal  . LUMBAR LAMINECTOMY  1970  .  OMENTECTOMY      reports that he quit smoking about 50 years ago. His smoking use included cigarettes. He has a 22.50 pack-year smoking history. He has never used smokeless tobacco. He reports current alcohol use. He reports that he does not use drugs. family history includes Heart disease in his brother; Lung cancer in his brother; Prostate cancer in his brother; Stroke in his father. Allergies  Allergen Reactions  . Ferrous Sulfate     REACTION: Stomach upset     Review of Systems  Constitutional: Positive for fatigue. Negative for chills and fever.  Respiratory: Positive for cough and shortness of breath. Negative for wheezing.   Cardiovascular: Negative for chest pain, palpitations and leg swelling.  Gastrointestinal: Positive for abdominal pain. Negative for abdominal distention, blood in stool, constipation, diarrhea, nausea and vomiting.  Genitourinary: Negative for difficulty urinating and dysuria.  Neurological: Positive for weakness.       Objective:   Physical Exam Constitutional:      Appearance: He is well-developed.  Cardiovascular:     Rate and Rhythm: Normal rate and regular rhythm.  Pulmonary:     Breath sounds: Normal breath sounds.  Abdominal:     General: Abdomen is flat. Bowel sounds are normal. There is no distension.     Tenderness: There is abdominal tenderness.     Hernia:  A hernia is present. Hernia is present in the ventral area.     Comments: Large ventral hernia which is soft and nontender Mild tenderness left upper quadrant to deep palpation  Genitourinary:    Scrotum/Testes:        Right: Swelling not present.        Left: Swelling not present.  Skin:    Findings: No rash.  Neurological:     Mental Status: He is alert.        Assessment:     Patient presents with at least several week history of nonspecific symptoms of increased malaise and weakness, intermittent lightheadedness (no orthostatic drop on blood pressure today with standing  blood pressure 110/70), left upper quadrant abdominal pain, and some dyspnea.  No documented weight loss    Plan:     -Check further labs with CBC, comprehensive metabolic panel, CEA, urinalysis -Obtain chest x-ray -Consider abdominal imaging if the above unremarkable -Reduce lisinopril to 10 mg daily  Eulas Post MD [facility] at [facility]

## 2018-08-25 NOTE — Addendum Note (Signed)
Addended by: Eulas Post on: 08/25/2018 01:47 PM   Modules accepted: Orders

## 2018-08-26 LAB — CEA: CEA: 5.8 ng/mL — ABNORMAL HIGH

## 2018-09-02 ENCOUNTER — Ambulatory Visit
Admission: RE | Admit: 2018-09-02 | Discharge: 2018-09-02 | Disposition: A | Discharge status: Home / Self Care | Payer: Medicare HMO | Source: Ambulatory Visit | Attending: Family Medicine | Admitting: Family Medicine

## 2018-09-02 DIAGNOSIS — R1010 Upper abdominal pain, unspecified: Secondary | ICD-10-CM | POA: Diagnosis not present

## 2018-09-02 DIAGNOSIS — R1012 Left upper quadrant pain: Secondary | ICD-10-CM

## 2018-09-09 ENCOUNTER — Encounter: Payer: Medicare HMO | Admitting: Family Medicine

## 2018-10-02 ENCOUNTER — Other Ambulatory Visit: Payer: Self-pay | Admitting: Family Medicine

## 2018-11-04 ENCOUNTER — Ambulatory Visit: Payer: Self-pay | Admitting: *Deleted

## 2018-11-04 NOTE — Telephone Encounter (Signed)
Opened chart to answer a question for an agent but I didn't need to speak with the pt.

## 2018-11-05 ENCOUNTER — Ambulatory Visit (INDEPENDENT_AMBULATORY_CARE_PROVIDER_SITE_OTHER): Payer: Medicare HMO | Admitting: Family Medicine

## 2018-11-05 ENCOUNTER — Encounter: Payer: Self-pay | Admitting: Family Medicine

## 2018-11-05 ENCOUNTER — Other Ambulatory Visit: Payer: Self-pay

## 2018-11-05 VITALS — BP 104/62 | HR 67 | Temp 97.0°F | Ht 67.5 in | Wt 173.3 lb

## 2018-11-05 DIAGNOSIS — R0609 Other forms of dyspnea: Secondary | ICD-10-CM

## 2018-11-05 DIAGNOSIS — I10 Essential (primary) hypertension: Secondary | ICD-10-CM | POA: Diagnosis not present

## 2018-11-05 DIAGNOSIS — R0600 Dyspnea, unspecified: Secondary | ICD-10-CM | POA: Diagnosis not present

## 2018-11-05 DIAGNOSIS — R5383 Other fatigue: Secondary | ICD-10-CM | POA: Diagnosis not present

## 2018-11-05 DIAGNOSIS — E871 Hypo-osmolality and hyponatremia: Secondary | ICD-10-CM | POA: Diagnosis not present

## 2018-11-05 LAB — BASIC METABOLIC PANEL
BUN: 17 mg/dL (ref 6–23)
CO2: 28 mEq/L (ref 19–32)
Calcium: 9.3 mg/dL (ref 8.4–10.5)
Chloride: 96 mEq/L (ref 96–112)
Creatinine, Ser: 1.08 mg/dL (ref 0.40–1.50)
GFR: 64.19 mL/min (ref >60.00)
Glucose, Bld: 93 mg/dL (ref 70–99)
Potassium: 5 mEq/L (ref 3.5–5.1)
Sodium: 129 mEq/L — ABNORMAL LOW (ref 135–145)

## 2018-11-05 LAB — VITAMIN D 25 HYDROXY (VIT D DEFICIENCY, FRACTURES): VITD: 43.48 ng/mL (ref 30.00–100.00)

## 2018-11-05 NOTE — Progress Notes (Addendum)
Subjective:     Patient ID: Ronald Gutierrez, male   DOB: 1926-08-07, 85 y.o.   MRN: [id number]  HPI Mr. Sanfilippo has history of hypothyroidism, hypertension, remote history of colon cancer, hyperlipidemia.  Remote history of Guillain-Barr syndrome.  He over the summer had extreme fatigue and had some orthostatic issues.  We reduce his lisinopril to half dose and symptoms seem to improve.  He was doing fairly well until few weeks ago.  He noted that his blood pressures been very erratic.  He had 1 low 105/50 and he reduced his blood pressure back to half dose again.  He then had blood pressure 175/56.  He is currently taking lisinopril 20 mg once daily.  He thinks he is hydrating well although his wife questions this.  He had labs over the summer including hemoglobin 12.5.  He has some chronic hyponatremia.  No thiazide use.  He is playing up to 18 holes of golf including last Thursday but is having difficulty getting through the rounds.  He has not had any chest pain whatsoever but has had some exertional dyspnea.  No orthopnea.  Appetite and weight are stable.  Denies peripheral edema issues.  He does have hypothyroidism and is on replacement followed by endocrinology.  Compliant with medications. Has had previous B12 which has been normal  Past Medical History:  Diagnosis Date  . ANEMIA   . Colon adenocarcinoma ([place]) 05/2008   T4, N0  . Diverticulitis   . FATIGUE 05/13/2009  . GERD 05/25/2008  . Guillain-Barre syndrome ([place])   . HYPERLIPIDEMIA 05/20/2008  . HYPERTENSION 05/20/2008  . Hypothyroidism   . KERATOSIS 10/21/2008  . Tubular adenoma of colon 10/2009   Past Surgical History:  Procedure Laterality Date  . APPENDECTOMY  1948  . CATARACT EXTRACTION    . CERVICAL LAMINECTOMY  1968  . COLECTOMY  07/2008   transverse  . HEMORRHOID SURGERY    . HERNIA REPAIR     inguinal  . LUMBAR LAMINECTOMY  1970  . OMENTECTOMY      reports that he quit smoking about 50 years ago. His smoking use included  cigarettes. He has a 22.50 pack-year smoking history. He has never used smokeless tobacco. He reports current alcohol use. He reports that he does not use drugs. family history includes Heart disease in his brother; Lung cancer in his brother; Prostate cancer in his brother; Stroke in his father. Allergies  Allergen Reactions  . Ferrous Sulfate     REACTION: Stomach upset     Review of Systems  Constitutional: Negative for appetite change, chills, fever and unexpected weight change.  Respiratory: Positive for shortness of breath. Negative for cough and wheezing.   Cardiovascular: Negative for chest pain, palpitations and leg swelling.  Gastrointestinal: Negative for abdominal pain.  Genitourinary: Negative for dysuria.  Neurological: Negative for headaches.  Hematological: Negative for adenopathy. Does not bruise/bleed easily.  Psychiatric/Behavioral: Negative for confusion and dysphoric mood.       Objective:   Physical Exam Constitutional:      Appearance: He is well-developed.  HENT:     Right Ear: External ear normal.     Left Ear: External ear normal.  Eyes:     Pupils: Pupils are equal, round, and reactive to light.  Neck:     Musculoskeletal: Neck supple.     Thyroid: No thyromegaly.  Cardiovascular:     Rate and Rhythm: Normal rate and regular rhythm.  Pulmonary:     Effort: Pulmonary effort  is normal. No respiratory distress.     Breath sounds: Normal breath sounds. No wheezing or rales.  Musculoskeletal:     Right lower leg: No edema.     Left lower leg: No edema.  Neurological:     Mental Status: He is alert and oriented to person, place, and time.     Comments: No focal strength deficits.        Assessment:     #1 generalized weakness and fatigue.  He is describing some increased exertional dyspnea.  Recent stable hemoglobin 12.5.  No signs of overt pulmonary edema.  No peripheral edema.  #2 history of mild chronic hyponatremia  #3 hypothyroidism with  most recent TSH at goal  #4 hypertension.  Has had somewhat erratic readings as above but currently stable today.  Blood pressure by me left arm seated 142/60 and standing 136/60    Plan:     -Check repeat basic metabolic panel and check 43-PIRJJOA vitamin D - set up echocardiogram to further assess -Would not make any changes in blood pressure medication at this point but continue to monitor -Stressed importance of adequate hydration  Eulas Post MD [facility] at [facility]  Serum sodium remains low.  We will put an order for urine sodium and urine osmolality  Eulas Post MD [facility] at [facility]

## 2018-11-05 NOTE — Addendum Note (Signed)
Addended by: Eulas Post on: 11/05/2018 02:13 PM   Modules accepted: Orders

## 2018-11-05 NOTE — Patient Instructions (Signed)
Consider OTC Vit D 2,000 IU once daily.  We will set up echocardiogram.

## 2018-11-11 ENCOUNTER — Other Ambulatory Visit: Payer: Self-pay | Admitting: Family Medicine

## 2018-11-11 ENCOUNTER — Other Ambulatory Visit: Payer: Self-pay

## 2018-11-11 ENCOUNTER — Ambulatory Visit (HOSPITAL_COMMUNITY): Payer: Medicare HMO | Attending: Cardiology

## 2018-11-11 DIAGNOSIS — R0609 Other forms of dyspnea: Secondary | ICD-10-CM

## 2018-11-11 DIAGNOSIS — R0600 Dyspnea, unspecified: Secondary | ICD-10-CM | POA: Diagnosis not present

## 2018-11-13 ENCOUNTER — Other Ambulatory Visit: Payer: Self-pay

## 2018-11-13 ENCOUNTER — Other Ambulatory Visit: Payer: Medicare HMO

## 2018-11-13 DIAGNOSIS — E871 Hypo-osmolality and hyponatremia: Secondary | ICD-10-CM

## 2018-11-15 LAB — OSMOLALITY, URINE: Osmolality, Ur: 393 mOsm/kg (ref 50–1200)

## 2018-11-15 LAB — SODIUM, URINE, RANDOM: Sodium, Ur: 51 mmol/L (ref 28–272)

## 2018-11-28 ENCOUNTER — Telehealth: Payer: Self-pay | Admitting: Family Medicine

## 2018-11-28 NOTE — Telephone Encounter (Signed)
Patient is calling back for lab results. Patient stated that she received the results on MyChart.

## 2018-11-29 NOTE — Telephone Encounter (Signed)
Called pt, from result note provider wanted pt to come in for a visit. Pt agreed and this visit was scheduled for next available appt on 12/03/18.

## 2018-12-03 ENCOUNTER — Encounter: Payer: Self-pay | Admitting: Family Medicine

## 2018-12-03 ENCOUNTER — Ambulatory Visit (INDEPENDENT_AMBULATORY_CARE_PROVIDER_SITE_OTHER): Payer: Medicare HMO | Admitting: Family Medicine

## 2018-12-03 ENCOUNTER — Other Ambulatory Visit: Payer: Self-pay

## 2018-12-03 VITALS — BP 136/78 | HR 46 | Temp 97.2°F | Ht 67.5 in | Wt 173.7 lb

## 2018-12-03 DIAGNOSIS — R531 Weakness: Secondary | ICD-10-CM | POA: Diagnosis not present

## 2018-12-03 DIAGNOSIS — L821 Other seborrheic keratosis: Secondary | ICD-10-CM | POA: Diagnosis not present

## 2018-12-03 DIAGNOSIS — C44622 Squamous cell carcinoma of skin of right upper limb, including shoulder: Secondary | ICD-10-CM | POA: Diagnosis not present

## 2018-12-03 DIAGNOSIS — E871 Hypo-osmolality and hyponatremia: Secondary | ICD-10-CM | POA: Diagnosis not present

## 2018-12-03 DIAGNOSIS — R5383 Other fatigue: Secondary | ICD-10-CM

## 2018-12-03 DIAGNOSIS — Z85828 Personal history of other malignant neoplasm of skin: Secondary | ICD-10-CM | POA: Diagnosis not present

## 2018-12-03 DIAGNOSIS — D485 Neoplasm of uncertain behavior of skin: Secondary | ICD-10-CM | POA: Diagnosis not present

## 2018-12-03 DIAGNOSIS — C44529 Squamous cell carcinoma of skin of other part of trunk: Secondary | ICD-10-CM | POA: Diagnosis not present

## 2018-12-03 DIAGNOSIS — L570 Actinic keratosis: Secondary | ICD-10-CM | POA: Diagnosis not present

## 2018-12-03 DIAGNOSIS — C44329 Squamous cell carcinoma of skin of other parts of face: Secondary | ICD-10-CM | POA: Diagnosis not present

## 2018-12-03 LAB — BASIC METABOLIC PANEL
BUN: 16 mg/dL (ref 6–23)
CO2: 26 mEq/L (ref 19–32)
Calcium: 9.2 mg/dL (ref 8.4–10.5)
Chloride: 98 mEq/L (ref 96–112)
Creatinine, Ser: 0.9 mg/dL (ref 0.40–1.50)
GFR: 79.21 mL/min (ref >60.00)
Glucose, Bld: 123 mg/dL — ABNORMAL HIGH (ref 70–99)
Potassium: 4.2 mEq/L (ref 3.5–5.1)
Sodium: 133 mEq/L — ABNORMAL LOW (ref 135–145)

## 2018-12-03 NOTE — Progress Notes (Signed)
Subjective:     Patient ID: Ronald Gutierrez, male   DOB: 06/17/1926, 85 y.o.   MRN: [id number]  HPI Ronald Gutierrez is here to discuss ongoing fatigue issues and recent hyponatremia.  Generally very active playing golf several times per week and he came in back in August with some generalized weakness and difficulties getting through 18 holes of golf.  He has had decreased stamina.  He was noted to have slightly low blood pressure though no consistent orthostatic drop at that time.  We reduced his blood pressure medication dose and had gotten several labs.  Sodium came back slightly low at 130.  He was having some abdominal pain at that time and abdominal ultrasound revealed hepatic steatosis but no other acute findings.  He has seen some slight improvement in energy but not much over the past several weeks as weather has cooled.  He states that his golf game has suffered and he is consistently shooting about 9 points higher than usual for 18 holes.  Repeat sodium October 20 came back 129. No thiazide use.   We ordered urine osmolality and urine sodium studies with osmolality 393 and urine sodium of 51.  We discussed other possible etiologies such as adrenal insufficiency.  He does have hypothyroidism and consistently takes his medications.  He had thyroid functions back in March which were stable. His weight is stable over the past few months but still has poor appetite  We had order some physical therapy for some general strengthening and he has seen some improvement from that and is doing some home exercises currently  Wt Readings from Last 3 Encounters:  12/03/18 173 lb 11.2 oz (78.8 kg)  11/05/18 173 lb 4.8 oz (78.6 kg)  08/23/18 173 lb 9.6 oz (78.7 kg)   Past Medical History:  Diagnosis Date  . ANEMIA   . Colon adenocarcinoma ([place]) 05/2008   T4, N0  . Diverticulitis   . FATIGUE 05/13/2009  . GERD 05/25/2008  . Guillain-Barre syndrome ([place])   . HYPERLIPIDEMIA 05/20/2008  . HYPERTENSION 05/20/2008  .  Hypothyroidism   . KERATOSIS 10/21/2008  . Tubular adenoma of colon 10/2009   Past Surgical History:  Procedure Laterality Date  . APPENDECTOMY  1948  . CATARACT EXTRACTION    . CERVICAL LAMINECTOMY  1968  . COLECTOMY  07/2008   transverse  . HEMORRHOID SURGERY    . HERNIA REPAIR     inguinal  . LUMBAR LAMINECTOMY  1970  . OMENTECTOMY      reports that he quit smoking about 50 years ago. His smoking use included cigarettes. He has a 22.50 pack-year smoking history. He has never used smokeless tobacco. He reports current alcohol use. He reports that he does not use drugs. family history includes Heart disease in his brother; Lung cancer in his brother; Prostate cancer in his brother; Stroke in his father. Allergies  Allergen Reactions  . Ferrous Sulfate     REACTION: Stomach upset    Review of Systems  Constitutional: Positive for appetite change and fatigue. Negative for fever and unexpected weight change.  Respiratory: Negative for cough and shortness of breath.   Cardiovascular: Negative for chest pain.  Gastrointestinal: Negative for abdominal pain.  Endocrine: Negative for polydipsia and polyuria.  Genitourinary: Negative for dysuria.  Neurological: Positive for weakness. Negative for seizures and syncope.  Hematological: Negative for adenopathy. Does not bruise/bleed easily.  Psychiatric/Behavioral: Negative for confusion.       Objective:   Physical Exam Vitals signs  reviewed.  Constitutional:      Appearance: Normal appearance.  Cardiovascular:     Rate and Rhythm: Normal rate and regular rhythm.  Pulmonary:     Effort: Pulmonary effort is normal.     Breath sounds: Normal breath sounds.  Musculoskeletal:     Right lower leg: No edema.     Left lower leg: No edema.  Neurological:     General: No focal deficit present.     Mental Status: He is alert and oriented to person, place, and time.        Assessment:     #1 hypertension stable on lisinopril.  No  orthostatic symptoms  #2 hyponatremia.  Recent urine sodium and osmolality studies as above.  He has liberalized his dietary sodium intake over the past few weeks and was very strict prior to that time  #3 generalized weakness and fatigue    Plan:     -Recheck basic metabolic panel today.  If hyponatremia persists consider referral back to endocrinology.  ?consider early AM cortisol level and ACTH stim test based on the above labs -continue with home strengthening exercises.  Eulas Post MD [facility] at [facility]

## 2018-12-03 NOTE — Patient Instructions (Signed)
Hyponatremia Hyponatremia is when the amount of salt (sodium) in your blood is too low. When sodium levels are low, your cells absorb extra water, which causes them to swell. The swelling happens throughout the body, but it mostly affects the brain. What are the causes? This condition may be caused by:  Certain medical conditions, such as: ? Heart, kidney, or liver problems. ? Thyroid problems. ? Adrenal gland problems. ? Metabolic conditions, such as Addison disease or syndrome of inappropriate antidiuretic hormone (SIADH).  Severe vomiting or diarrhea.  Certain medicines or illegal drugs.  Dehydration.  Drinking too much water.  Eating a diet that is low in sodium.  Large burns on your body.  Excessive sweating. What increases the risk? You are more likely to develop this condition if you:  Have long-term (chronic) kidney disease.  Have heart failure.  Have a medical condition that causes frequent or excessive diarrhea.  Participate in intense physical activities, such as marathon running.  Take certain medicines that affect the sodium and fluid balance in the blood. Some of these medicine types include: ? Diuretics. ? NSAIDs. ? Some opioid pain medicines. ? Some antidepressants. ? Some seizure prevention medicines. What are the signs or symptoms? Symptoms of this condition include:  Headache.  Nausea and vomiting.  Being very tired (lethargic).  Muscle weakness and cramping.  Loss of appetite.  Feeling weak or light-headed. Severe symptoms of this condition include:  Confusion.  Agitation.  Having a rapid heart rate.  Passing out (fainting).  Seizures.  Coma. How is this diagnosed? This condition is diagnosed based on:  A physical exam.  Your medical history.  Tests, including: ? Blood tests. ? Urine tests. How is this treated? Treatment for this condition depends on the cause. Treatment may include:  Getting fluids through an IV  that is inserted into one of your veins.  Medicines to correct the sodium imbalance. If medicines are causing the condition, the medicines will need to be adjusted.  Limiting your water or fluid intake to get the correct sodium balance.  Monitoring in the hospital setting to closely watch your symptoms for improvement. Follow these instructions at home:   Take over-the-counter and prescription medicines only as told by your health care provider. Many medicines can make this condition worse. Talk with your health care provider about any medicines that you are currently taking.  Carefully follow a recommended diet as told by your health care provider.  Carefully follow instructions from your health care provider about fluid restrictions.  Do not drink alcohol.  Keep all follow-up visits as told by your health care provider. This is important. Contact a health care provider if:  You develop worsening nausea, fatigue, headache, confusion, or weakness.  Your symptoms go away and then return.  You have problems following the recommended diet. Get help right away if:  You have a seizure.  You pass out.  You have ongoing diarrhea or vomiting. Summary  Hyponatremia is when the amount of salt (sodium) in your blood is too low.  When sodium levels are low, your cells absorb extra water, which causes them to swell.  The swelling happens throughout the body, but it mostly affects the brain.  Treatment for this condition depends on the cause. It may include IV fluids, medicines, and limiting your fluid intake. This information is not intended to replace advice given to you by your health care provider. Make sure you discuss any questions you have with your health care provider. Document  Released: 12/23/2001 Document Revised: 11/16/2017 Document Reviewed: 11/16/2017 Elsevier Patient Education  2020 [employer].

## 2018-12-17 ENCOUNTER — Other Ambulatory Visit: Payer: Self-pay

## 2018-12-17 ENCOUNTER — Ambulatory Visit (INDEPENDENT_AMBULATORY_CARE_PROVIDER_SITE_OTHER): Payer: Medicare HMO | Admitting: Internal Medicine

## 2018-12-17 VITALS — BP 130/80 | HR 88 | Ht 67.5 in | Wt 172.0 lb

## 2018-12-17 DIAGNOSIS — E871 Hypo-osmolality and hyponatremia: Secondary | ICD-10-CM

## 2018-12-17 DIAGNOSIS — E039 Hypothyroidism, unspecified: Secondary | ICD-10-CM

## 2018-12-17 DIAGNOSIS — R5383 Other fatigue: Secondary | ICD-10-CM

## 2018-12-17 NOTE — Progress Notes (Signed)
Patient ID: Ronald Gutierrez, male   DOB: [birth year], 85 y.o.   MRN: [id number]  This visit occurred during the SARS-CoV-2 public health emergency.  Safety protocols were in place, including screening questions prior to the visit, additional usage of staff PPE, and extensive cleaning of exam room while observing appropriate contact time as indicated for disinfecting solutions.   HPI  Ronald Gutierrez is a 85 y.o.-year-old male, returning for f/u for hypothyroidism, dx 2010. Last visit 8 months ago.  He c/o balance problems >> fell 2x [place] the golf [place] in the last month.  He complains of fatigue at his recent visit with PCP.  Of note, over the summer, his lisinopril dose was decreased due to orthostasis but his blood pressure were erratic afterwards.  He was also found to have more significant hyponatremia (sodium 129) at that time.  He has a chronic history of this and he relaxed his fluid restrictions recently.  PCP obtained a random urine sodium and this was 51 in his urine osmolality was 393.  He was advised to restart his fluid restrictions >> he reduced alcohol.  Sodium level obtained 12/03/2018 was 133.  Reviewed and addended history: Pt has a history of poorly controlled hypothyroidism, with previous TSH levels fluctuating, but mostly elevated due to taking levothyroxine along with Orange juice, multivitamins, PPIs.    After we separated his levothyroxine from his supplements and medications, his tests improved to the point of a suppressed TSH, for which we had to decrease his levothyroxine dose from 137 mcg down.  Afterwards, we continue to decrease his levothyroxine dose as his TSH remained suppressed.    Pt is on levothyroxine 88 mcg daily, taken: - in am - fasting - coffee and creamer in a.m. - at least 0.5 hours from b'fast - no Ca, Fe - + MVI, PPIs later in the day - not on Biotin  Reviewed his TFTs: Lab Results  Component Value Date   TSH 0.63 03/22/2018   TSH 0.16 (L) 09/18/2017    TSH 0.02 (L) 06/12/2017   TSH <0.01 Repeated and verified X2. (L) 03/21/2017   TSH 0.01 (L) 03/22/2016   Lab Results  Component Value Date   FREET4 1.25 03/22/2018   FREET4 1.03 09/18/2017   FREET4 1.41 06/12/2017   FREET4 1.43 03/21/2017   FREET4 1.53 03/22/2016   Previously: Component Value Date   TSH 4.83 (H) 05/11/2015   TSH 8.76 (H) 03/23/2015   TSH 1.99 11/03/2014   TSH 3.70 09/22/2014   TSH 7.13 (H) 03/17/2014   TSH 8.85 (H) 10/28/2013   FREET4 1.03 09/18/2017   FREET4 1.41 06/12/2017   FREET4 1.43 03/21/2017   FREET4 1.53 03/22/2016   FREET4 1.40 05/11/2015   FREET4 1.38 03/23/2015   FREET4 1.17 09/22/2014   FREET4 1.32 03/17/2014   FREET4 1.13 10/28/2013   FREET4 1.85 (H) 09/16/2013    Pt denies: - feeling nodules in neck - hoarseness - choking - SOB with lying down But has chronic dysphagia.  He also has a history of colon cancer (s/p colectomy in 2010), HL, HTN, GERD.  No FH of thyroid cancer. No h/o radiation tx to head or neck.  No herbal supplements. No Biotin use. No recent steroids use.   He drinks 2 diet sodas a day, 2 glasses of water, 1 protein shake, 1 large mixed fruit drink, 3 cups of coffee. He already decreased alcohol from 1 cocktail daily to only occasionally now (not in last 3 day).  In 03/2018,  after he stopped golfing for 3 weeks >> he noticed that he was more fatigued. He organizes a Aeronautical engineer.  ROS: Constitutional: no weight gain/no weight loss, + fatigue, no subjective hyperthermia, no subjective hypothermia Eyes: no blurry vision, no xerophthalmia ENT: no sore throat, + see HPI Cardiovascular: no CP/no SOB/no palpitations/no leg swelling Respiratory: no cough/no SOB/no wheezing Gastrointestinal: no N/no V/no D/no C/no acid reflux Musculoskeletal: no muscle aches/+ joint aches Skin: no rashes, no hair loss Neurological: no tremors/no numbness/no tingling/no dizziness, + dysequilibrium  I reviewed pt's medications, allergies,  PMH, social hx, family hx, and changes were documented in the history of present illness. Otherwise, unchanged from my initial visit note.  Past Medical History:  Diagnosis Date  . ANEMIA   . Colon adenocarcinoma ([place]) 05/2008   T4, N0  . Diverticulitis   . FATIGUE 05/13/2009  . GERD 05/25/2008  . Guillain-Barre syndrome ([place])   . HYPERLIPIDEMIA 05/20/2008  . HYPERTENSION 05/20/2008  . Hypothyroidism   . KERATOSIS 10/21/2008  . Tubular adenoma of colon 10/2009   Past Surgical History:  Procedure Laterality Date  . APPENDECTOMY  1948  . CATARACT EXTRACTION    . CERVICAL LAMINECTOMY  1968  . COLECTOMY  07/2008   transverse  . HEMORRHOID SURGERY    . HERNIA REPAIR     inguinal  . LUMBAR LAMINECTOMY  1970  . OMENTECTOMY     Social History   Socioeconomic History  . Marital status: Married    Spouse name: Not on file  . Number of children: Not on file  . Years of education: Not on file  . Highest education level: Not on file  Occupational History  . Occupation: Retired    Fish farm manager: RETIRED  Social Needs  . Financial resource strain: Not on file  . Food insecurity    Worry: Not on file    Inability: Not on file  . Transportation needs    Medical: Not on file    Non-medical: Not on file  Tobacco Use  . Smoking status: Former Smoker    Packs/day: 1.50    Years: 15.00    Pack years: 22.50    Types: Cigarettes    Quit date: 01/17/1968    Years since quitting: 50.9  . Smokeless tobacco: Never Used  . Tobacco comment: aged out of preventive screens  Substance and Sexual Activity  . Alcohol use: Yes  . Drug use: No  . Sexual activity: Not on file  Lifestyle  . Physical activity    Days per week: Not on file    Minutes per session: Not on file  . Stress: Not on file  Relationships  . Social Herbalist on phone: Not on file    Gets together: Not on file    Attends religious service: Not on file    Active member of club or organization: Not on file    Attends  meetings of clubs or organizations: Not on file    Relationship status: Not on file  . Intimate partner violence    Fear of current or ex partner: Not on file    Emotionally abused: Not on file    Physically abused: Not on file    Forced sexual activity: Not on file  Other Topics Concern  . Not on file  Social History Narrative  . Not on file   Current Outpatient Medications on File Prior to Visit  Medication Sig Dispense Refill  . aspirin 81 MG tablet  Take 81 mg by mouth daily.    Marland Kitchen azelastine (ASTELIN) 0.1 % nasal spray SPRAY ONE SPRAY IN EACH NOSTRIL TWICE DAILY AS DIRECTED 30 mL 1  . fexofenadine (ALLEGRA) 180 MG tablet Take 180 mg by mouth daily.    Marland Kitchen levothyroxine (SYNTHROID, LEVOTHROID) 88 MCG tablet Take 1 tablet (88 mcg total) by mouth daily before breakfast. 90 tablet 3  . lisinopril (ZESTRIL) 20 MG tablet Take 1 tablet (20 mg total) by mouth daily. 90 tablet 3  . Multiple Vitamins-Minerals (CENTRUM ULTRA MENS PO) Take 1 tablet by mouth daily.      Marland Kitchen omeprazole (PRILOSEC) 20 MG capsule TAKE ONE CAPSULE BY MOUTH DAILY 90 capsule 0  . simvastatin (ZOCOR) 20 MG tablet TAKE ONE TABLET BY MOUTH AT BEDTIME 90 tablet 0  . triamcinolone (NASACORT ALLERGY 24HR) 55 MCG/ACT AERO nasal inhaler Place 2 sprays into the nose daily.     No current facility-administered medications on file prior to visit.    Allergies  Allergen Reactions  . Ferrous Sulfate     REACTION: Stomach upset   Family History  Problem Relation Age of Onset  . Lung cancer Brother   . Heart disease Brother   . Prostate cancer Brother   . Stroke Father    PE: BP 130/80   Pulse 88   Ht 5' 7.5" (1.715 m)   Wt 172 lb (78 kg)   SpO2 90%   BMI 26.54 kg/m  Wt Readings from Last 3 Encounters:  12/17/18 172 lb (78 kg)  12/03/18 173 lb 11.2 oz (78.8 kg)  11/05/18 173 lb 4.8 oz (78.6 kg)   Constitutional: normal weight, in NAD Eyes: PERRLA, EOMI, no exophthalmos ENT: moist mucous membranes, no thyromegaly, no  cervical lymphadenopathy Cardiovascular: RRR, No MRG Respiratory: CTA B Gastrointestinal: abdomen soft, NT, ND, BS+ Musculoskeletal: no deformities, strength intact in all 4 Skin: moist, warm, no rashes Neurological: no tremor with outstretched hands, DTR undetectable in all 4 (h/o Guillain-Barre)  ASSESSMENT: 1. Hypothyroidism - uncontrolled  2.  Fatigue  3.  Hyponatremia  PLAN:  1.  Patient with long history of uncontrolled hypothyroidism, on levothyroxine therapy, with fluctuating TFTs, initially due to not taking his medications correctly.  In the last 4 years, he started to take the medications correctly and his TFTs started to improve and he even developed suppressed TSH levels.  We had to keep decreasing the doses of his levothyroxine from the original 137 mcg daily to the current dose of 88 mcg daily. - latest thyroid labs reviewed with pt >> normal: Lab Results  Component Value Date   TSH 0.63 03/22/2018   - he continues on LT4 88 mcg daily - pt feels good on this dose except he still complains of fatigue. - we discussed about taking the thyroid hormone every day, with water, >30 minutes before breakfast, separated by >4 hours from acid reflux medications, calcium, iron, multivitamins. Pt. is taking it correctly. - will check thyroid tests at next lab draw: TSH and fT4 - If labs are abnormal, he will need to return for repeat TFTs in 1.5 months  2.  Fatigue -He was complaining about this also at last visit, in 03/2018 -We will recheck his TFTs today to see if we do not need to increase his levothyroxine dose -He does have hyponatremia, which has worsened since last visit, however, latest sodium was very close to normal (see below) -He has a history of colon cancer and most recent CEA was slightly  higher -He also has a history of low-normal vitamin B12 but the level from 09/2017 was well into the normal range -Recent vitamin D level was also normal -We will check a  cosyntropin stimulation test along with a B12, patient will need to return for the labs  3.  Hyponatremia -Longstanding -Previously on strict fluid restriction, which he relaxed recently >> drinking 2 alcohol drinks a day (he already started to reduce these) and drinking diet sodas and several cups of coffee a day >> I advised him to stop sodas and only keep 1 cup of coffee a day. If the Na does not improve, may need to also reduce the other liquids. -Lowest sodium level was 129, but this improved to 133 2 weeks ago (after he reduced alcohol) -Reviewed previous investigation by PCP.  Urine sodium was 51 (random), urine osmolality 393.  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  -At the next lab draw, we will recheck a sodium level  -To rule out adrenal insufficiency we will check a cosyntropin stimulation test  Orders Placed This Encounter  Procedures  . xtpit - TSH  . xtpit - free T4  . Vitamin B12  . Stim test - cortisol 0 min  . Stim test - ACTH 0 min  . Stim test - cortisol 30 min  . Stim test - cortisol 60 min  . Sodium   Component     Latest Ref Rng & Units 12/24/2018 12/24/2018 12/24/2018         8:33 AM  9:15 AM  9:43 AM  TSH     0.35 - 4.50 uIU/mL 0.27 (L)    T4,Free(Direct)     0.60 - 1.60 ng/dL 1.44    Sodium     135 - 145 mEq/L 137    Vitamin B12     211 - 911 pg/mL 458    Cortisol, Plasma     ug/dL 16.5 23.0 26.9  ACTH pending.  However, cortisol stimulation test is normal, ruling out adrenal deficiency.  Vitamin B12 normal.  Sodium normalized.  We will encourage him to continue with fluid restrictions.  TSH slightly low so I will advise him to decrease the dose of his levothyroxine to 75 mcg daily and recheck his thyroid tests in 1.5 months.  Philemon Kingdom, MD PhD [facility] Endocr[facility]

## 2018-12-17 NOTE — Patient Instructions (Signed)
Please come back for labs fasting, in am.  Please elmininate diet drinks and only drink 1 coffee a day.  Please continue levothyroxine 88 mcg daily  Take the thyroid hormone every day, with water, at least 30 minutes before breakfast, separated by at least 4 hours from: - acid reflux medications - calcium - iron - multivitamins  Please come back for a follow-up appointment in 6 months.

## 2018-12-24 ENCOUNTER — Other Ambulatory Visit: Payer: Self-pay

## 2018-12-24 ENCOUNTER — Other Ambulatory Visit (INDEPENDENT_AMBULATORY_CARE_PROVIDER_SITE_OTHER): Payer: Medicare HMO

## 2018-12-24 ENCOUNTER — Ambulatory Visit (INDEPENDENT_AMBULATORY_CARE_PROVIDER_SITE_OTHER): Payer: Medicare HMO

## 2018-12-24 DIAGNOSIS — E039 Hypothyroidism, unspecified: Secondary | ICD-10-CM | POA: Diagnosis not present

## 2018-12-24 DIAGNOSIS — R5383 Other fatigue: Secondary | ICD-10-CM

## 2018-12-24 DIAGNOSIS — E871 Hypo-osmolality and hyponatremia: Secondary | ICD-10-CM | POA: Diagnosis not present

## 2018-12-24 LAB — T4, FREE: Free T4: 1.44 ng/dL (ref 0.60–1.60)

## 2018-12-24 LAB — VITAMIN B12: Vitamin B-12: 458 pg/mL (ref 211–911)

## 2018-12-24 LAB — CORTISOL
Cortisol, Plasma: 16.5 ug/dL
Cortisol, Plasma: 23 ug/dL
Cortisol, Plasma: 26.9 ug/dL

## 2018-12-24 LAB — SODIUM: Sodium: 137 mEq/L (ref 135–145)

## 2018-12-24 LAB — TSH: TSH: 0.27 u[IU]/mL — ABNORMAL LOW (ref 0.35–4.50)

## 2018-12-24 MED ORDER — COSYNTROPIN 0.25 MG IJ SOLR
0.2500 mg | Freq: Once | INTRAMUSCULAR | Status: AC
  Administered 2018-12-24: 0.25 mg via INTRAMUSCULAR

## 2018-12-24 NOTE — Progress Notes (Signed)
Per orders of Dr. Gherghe injection of Cortrosyn given today by Melissa, Certified Medical Assistant. Patient tolerated injection well.  

## 2018-12-25 ENCOUNTER — Encounter: Payer: Self-pay | Admitting: Internal Medicine

## 2018-12-25 MED ORDER — LEVOTHYROXINE SODIUM 75 MCG PO TABS: 75.0000 ug | ORAL_TABLET | Freq: Every day | ORAL | 3 refills | Status: AC

## 2018-12-26 LAB — ACTH: C206 ACTH: 40 pg/mL (ref 6–50)

## 2019-01-21 DIAGNOSIS — R69 Illness, unspecified: Secondary | ICD-10-CM | POA: Diagnosis not present

## 2019-01-27 ENCOUNTER — Telehealth: Payer: Self-pay | Admitting: *Deleted

## 2019-01-27 NOTE — Telephone Encounter (Signed)
As of now, my understanding is that history of Guillain- Barr syndrome history does not contraindicate getting Covid vaccine

## 2019-01-27 NOTE — Telephone Encounter (Signed)
Please see message. °

## 2019-01-27 NOTE — Telephone Encounter (Signed)
Copied from [place] [id number]. Topic: General - Inquiry >> Jan 27, 2019 12:12 PM Ronald Gutierrez wrote: Reason for CRM: Patient is requesting PCP to call patient back, patient states he does not get the flu shot.  Patient is wondering if he can get the new covid vaccine. Call back [phone]

## 2019-01-28 NOTE — Telephone Encounter (Signed)
Called patient and spoke to his wife Butch Penny and gave them both the message from Dr. Elease Hashimoto as they were on speaker phone. Patient and wife verbalized an understanding.

## 2019-02-05 ENCOUNTER — Other Ambulatory Visit: Payer: Medicare HMO

## 2019-02-06 ENCOUNTER — Other Ambulatory Visit: Payer: Self-pay

## 2019-02-06 ENCOUNTER — Other Ambulatory Visit (INDEPENDENT_AMBULATORY_CARE_PROVIDER_SITE_OTHER): Payer: Medicare HMO

## 2019-02-06 DIAGNOSIS — E039 Hypothyroidism, unspecified: Secondary | ICD-10-CM | POA: Diagnosis not present

## 2019-02-06 LAB — TSH: TSH: 0.75 u[IU]/mL (ref 0.35–4.50)

## 2019-02-06 LAB — T4, FREE: Free T4: 1.05 ng/dL (ref 0.60–1.60)

## 2019-02-10 ENCOUNTER — Ambulatory Visit: Payer: Medicare HMO | Attending: Internal Medicine

## 2019-02-10 DIAGNOSIS — Z23 Encounter for immunization: Secondary | ICD-10-CM

## 2019-02-10 NOTE — Progress Notes (Signed)
   Covid-19 Vaccination Clinic  Name:  Ronald Gutierrez    MRN: [id number] DOB: Nov 07, 1926  02/10/2019  Ronald Gutierrez was observed post Covid-19 immunization for 15 minutes without incidence. He was provided with Vaccine Information Sheet and instruction to access the V-Safe system.   Ronald Gutierrez was instructed to call 911 with any severe reactions post vaccine: Marland Kitchen Difficulty breathing  . Swelling of your face and throat  . A fast heartbeat  . A bad rash all over your body  . Dizziness and weakness    Immunizations Administered    Name Date Dose VIS Date Route   Pfizer COVID-19 Vaccine 02/10/2019  9:18 AM 0.3 mL 12/27/2018 Intramuscular   Manufacturer: [place]   Lot: [id number]   [place]: [id number]

## 2019-02-27 DIAGNOSIS — H1131 Conjunctival hemorrhage, right eye: Secondary | ICD-10-CM | POA: Diagnosis not present

## 2019-03-03 ENCOUNTER — Ambulatory Visit: Payer: Medicare HMO | Attending: Internal Medicine

## 2019-03-03 DIAGNOSIS — Z23 Encounter for immunization: Secondary | ICD-10-CM | POA: Insufficient documentation

## 2019-03-03 NOTE — Progress Notes (Signed)
   Covid-19 Vaccination Clinic  Name:  Ronald Gutierrez    MRN: [id number] DOB: December 29, 1926  03/03/2019  [name] was observed post Covid-19 immunization for 15 minutes without incidence. He was provided with Vaccine Information Sheet and instruction to access the V-Safe system.   [name] was instructed to call 911 with any severe reactions post vaccine: Marland Kitchen Difficulty breathing  . Swelling of your face and throat  . A fast heartbeat  . A bad rash all over your body  . Dizziness and weakness    Immunizations Administered    Name Date Dose VIS Date Route   Pfizer COVID-19 Vaccine 03/03/2019 10:05 AM 0.3 mL 12/27/2018 Intramuscular   Manufacturer: [place]   Lot: [id number]   [place]: [id number]

## 2019-03-27 ENCOUNTER — Encounter (HOSPITAL_COMMUNITY): Payer: Self-pay

## 2019-03-27 ENCOUNTER — Emergency Department (HOSPITAL_COMMUNITY)
Admission: EM | Admit: 2019-03-27 | Discharge: 2019-03-27 | Disposition: A | Discharge status: Home / Self Care | Payer: Medicare HMO | Attending: Emergency Medicine | Admitting: Emergency Medicine

## 2019-03-27 ENCOUNTER — Telehealth: Payer: Self-pay | Admitting: *Deleted

## 2019-03-27 ENCOUNTER — Other Ambulatory Visit: Payer: Self-pay

## 2019-03-27 ENCOUNTER — Emergency Department (HOSPITAL_COMMUNITY): Payer: Medicare HMO

## 2019-03-27 DIAGNOSIS — I10 Essential (primary) hypertension: Secondary | ICD-10-CM | POA: Insufficient documentation

## 2019-03-27 DIAGNOSIS — Z85038 Personal history of other malignant neoplasm of large intestine: Secondary | ICD-10-CM | POA: Insufficient documentation

## 2019-03-27 DIAGNOSIS — Z87891 Personal history of nicotine dependence: Secondary | ICD-10-CM | POA: Diagnosis not present

## 2019-03-27 DIAGNOSIS — S0990XA Unspecified injury of head, initial encounter: Secondary | ICD-10-CM | POA: Diagnosis not present

## 2019-03-27 DIAGNOSIS — R42 Dizziness and giddiness: Secondary | ICD-10-CM

## 2019-03-27 DIAGNOSIS — E039 Hypothyroidism, unspecified: Secondary | ICD-10-CM | POA: Insufficient documentation

## 2019-03-27 DIAGNOSIS — R531 Weakness: Secondary | ICD-10-CM | POA: Insufficient documentation

## 2019-03-27 DIAGNOSIS — Z7982 Long term (current) use of aspirin: Secondary | ICD-10-CM | POA: Diagnosis not present

## 2019-03-27 DIAGNOSIS — Z79899 Other long term (current) drug therapy: Secondary | ICD-10-CM | POA: Insufficient documentation

## 2019-03-27 LAB — CBC WITH DIFFERENTIAL/PLATELET
Abs Immature Granulocytes: 0.03 10*3/uL (ref 0.00–0.07)
Basophils Absolute: 0.1 10*3/uL (ref 0.0–0.1)
Basophils Relative: 1 %
Eosinophils Absolute: 0.3 10*3/uL (ref 0.0–0.5)
Eosinophils Relative: 4 %
HCT: 38.8 % — ABNORMAL LOW (ref 39.0–52.0)
Hemoglobin: 12.8 g/dL — ABNORMAL LOW (ref 13.0–17.0)
Immature Granulocytes: 0 %
Lymphocytes Relative: 18 %
Lymphs Abs: 1.5 10*3/uL (ref 0.7–4.0)
MCH: 34 pg (ref 26.0–34.0)
MCHC: 33 g/dL (ref 30.0–36.0)
MCV: 102.9 fL — ABNORMAL HIGH (ref 80.0–100.0)
Monocytes Absolute: 0.8 10*3/uL (ref 0.1–1.0)
Monocytes Relative: 10 %
Neutro Abs: 5.5 10*3/uL (ref 1.7–7.7)
Neutrophils Relative %: 67 %
Platelets: 277 10*3/uL (ref 150–400)
RBC: 3.77 MIL/uL — ABNORMAL LOW (ref 4.22–5.81)
RDW: 12.8 % (ref 11.5–15.5)
WBC: 8.1 10*3/uL (ref 4.0–10.5)
nRBC: 0 % (ref 0.0–0.2)

## 2019-03-27 LAB — BASIC METABOLIC PANEL
Anion gap: 10 (ref 5–15)
BUN: 21 mg/dL (ref 8–23)
CO2: 23 mmol/L (ref 22–32)
Calcium: 9.3 mg/dL (ref 8.9–10.3)
Chloride: 104 mmol/L (ref 98–111)
Creatinine, Ser: 0.98 mg/dL (ref 0.61–1.24)
GFR calc Af Amer: >60 mL/min (ref >60)
GFR calc non Af Amer: >60 mL/min (ref >60)
Glucose, Bld: 98 mg/dL (ref 70–99)
Potassium: 4 mmol/L (ref 3.5–5.1)
Sodium: 137 mmol/L (ref 135–145)

## 2019-03-27 NOTE — Telephone Encounter (Signed)
Caller states, Ronald Gutierrez [place] and he's losing his equilibrium, nearly fell going into bathroom and has to brace himself on the walls. Caller states it happened last summer and 2 months ago. He is bumping into walls when walking. Felt dizzy this morning.   Care Advice Given Per Guideline GO TO ED NOW (OR PCP TRIAGE): * IF NO PCP (PRIMARY CARE PROVIDER) SECOND-LEVEL TRIAGE: You need to be seen within the next hour. Go to the [facility] at [place] as soon as you can. CARE ADVICE given per Dizziness (Adult) guideline. ANOTHER ADULT SHOULD DRIVE: * It is better and safer if another adult drives instead of you.   Called patient to follow-up and he stated that he is waiting for his wife to come take him, she will be there in the next half hour.

## 2019-03-27 NOTE — Discharge Instructions (Signed)
Make sure you are getting plenty of rest, drink a lot of fluids and eating 3 meals a day.  Consider using a cane when you walk.  See your primary care doctor as soon as possible for further evaluation and treatment.  It may be helpful to return to physical therapy for further treatment.

## 2019-03-27 NOTE — ED Notes (Signed)
Pt transported to CT ?

## 2019-03-27 NOTE — ED Triage Notes (Signed)
Patient states that he has been having dizziness/off balance x 2-3 weeks. Patient states he fell approx 3 weeks ago [place] and his head on the ground lightly.

## 2019-03-27 NOTE — ED Notes (Signed)
Pt ambulated to hallway restroom. Pt reasonably steady on feet, pt denies dizziness.

## 2019-03-27 NOTE — ED Provider Notes (Signed)
[facility] DEPT Provider Note   CSN: [id number] Arrival date & time: 03/27/19  1224     History Chief Complaint  Patient presents with  . Dizziness  . Hypertension    Ronald Gutierrez is a 86 y.o. male.  HPI He complains of "my equilibrium is off," for the last 2-1/2 weeks.  He describes having trouble walking, having to hold onto things, to prevent falling .  He denies headache, focal weakness, paresthesias or actually falling.  He is eating well.  He does not have fever, chills, headache, cough, shortness of breath, chest, abdomen or back pain.  There are no other known modifying factors.    Past Medical History:  Diagnosis Date  . ANEMIA   . Colon adenocarcinoma ([place]) 05/2008   T4, N0  . Diverticulitis   . FATIGUE 05/13/2009  . GERD 05/25/2008  . Guillain-Barre syndrome ([place])   . HYPERLIPIDEMIA 05/20/2008  . HYPERTENSION 05/20/2008  . Hypothyroidism   . KERATOSIS 10/21/2008  . Tubular adenoma of colon 10/2009    Patient Active Problem List   Diagnosis Date Noted  . History of Guillain-Barre syndrome 12/03/2013  . History of malignant neoplasm of large intestine 09/22/2009  . KERATOSIS 10/21/2008  . 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 GERD 05/25/2008  . PERSONAL HX COLONIC POLYPS 05/25/2008  . Hypothyroidism 05/20/2008  . Hyperlipidemia 05/20/2008  . ANEMIA 05/20/2008  . Essential hypertension 05/20/2008    Past Surgical History:  Procedure Laterality Date  . APPENDECTOMY  1948  . CATARACT EXTRACTION    . CERVICAL LAMINECTOMY  1968  . COLECTOMY  07/2008   transverse  . HEMORRHOID SURGERY    . HERNIA REPAIR     inguinal  . LUMBAR LAMINECTOMY  1970  . OMENTECTOMY         Family History  Problem Relation Age of Onset  . Lung cancer Brother   . Heart disease Brother   . Prostate cancer Brother   . Stroke Father     Social History   Tobacco Use  . Smoking status: Former Smoker    Packs/day: 1.50    Years:  15.00    Pack years: 22.50    Types: Cigarettes    Quit date: 01/17/1968    Years since quitting: 51.2  . Smokeless tobacco: Never Used  . Tobacco comment: aged out of preventive screens  Substance Use Topics  . Alcohol use: Yes  . Drug use: No    Home Medications Prior to Admission medications   Medication Sig Start Date End Date Taking? Authorizing Provider  aspirin 81 MG tablet Take 81 mg by mouth daily.   Yes [provider]  azelastine (ASTELIN) 0.1 % nasal spray SPRAY ONE SPRAY IN EACH NOSTRIL TWICE DAILY AS DIRECTED Patient taking differently: Place 1 spray into both nostrils 2 (two) times daily as needed for allergies.  11/13/18  Yes Burchette, Alinda Sierras, MD  fexofenadine (ALLEGRA) 180 MG tablet Take 180 mg by mouth daily.   Yes [provider]  levothyroxine (SYNTHROID) 75 MCG tablet Take 1 tablet (75 mcg total) by mouth daily. 12/25/18  Yes Philemon Kingdom, MD  lisinopril (ZESTRIL) 20 MG tablet Take 1 tablet (20 mg total) by mouth daily. 05/27/18  Yes Burchette, Alinda Sierras, MD  Multiple Vitamins-Minerals (CENTRUM ULTRA MENS PO) Take 1 tablet by mouth daily.     Yes [provider]  omeprazole (PRILOSEC) 20 MG capsule TAKE ONE CAPSULE BY MOUTH DAILY 05/27/18  Yes  Burchette, Alinda Sierras, MD  simvastatin (ZOCOR) 20 MG tablet TAKE ONE TABLET BY MOUTH AT BEDTIME 10/04/18  Yes Burchette, Alinda Sierras, MD  triamcinolone (NASACORT ALLERGY 24HR) 55 MCG/ACT AERO nasal inhaler Place 2 sprays into the nose daily as needed (congestion).    Yes [provider]    Allergies    Ferrous sulfate  Review of Systems   Review of Systems  All other systems reviewed and are negative.   Physical Exam Updated Vital Signs BP (!) 159/80   Pulse 62   Temp 97.6 F (36.4 C) (Oral)   Resp 19   Ht 5\' 9"  (1.753 m)   Wt 77.1 kg   SpO2 99%   BMI 25.10 kg/m   Physical Exam Vitals and nursing note reviewed.  Constitutional:      General: He is not in acute distress.     Appearance: He is well-developed. He is not ill-appearing, toxic-appearing or diaphoretic.  HENT:     Head: Normocephalic and atraumatic.     Right Ear: External ear normal.     Left Ear: External ear normal.  Eyes:     Conjunctiva/sclera: Conjunctivae normal.     Pupils: Pupils are equal, round, and reactive to light.  Neck:     Trachea: Phonation normal.  Cardiovascular:     Rate and Rhythm: Normal rate and regular rhythm.     Heart sounds: Normal heart sounds.  Pulmonary:     Effort: Pulmonary effort is normal. No respiratory distress.     Breath sounds: Normal breath sounds.  Abdominal:     General: There is no distension.     Palpations: Abdomen is soft.     Tenderness: There is no abdominal tenderness.  Musculoskeletal:        General: Normal range of motion.     Cervical back: Normal range of motion and neck supple.  Skin:    General: Skin is warm and dry.  Neurological:     Mental Status: He is alert and oriented to person, place, and time.     Cranial Nerves: No cranial nerve deficit.     Sensory: No sensory deficit.     Motor: No abnormal muscle tone.     Coordination: Coordination normal.     Comments: No dysarthria, aphasia or nystagmus.  No pronator drift.  No ataxia.  Finger-to-nose, and heel to chin testing, bilaterally; are symmetric and equal.  Psychiatric:        Mood and Affect: Mood normal.        Behavior: Behavior normal.        Thought Content: Thought content normal.        Judgment: Judgment normal.     ED Results / Procedures / Treatments   Labs (all labs ordered are listed, but only abnormal results are displayed) Labs Reviewed  CBC WITH DIFFERENTIAL/PLATELET - Abnormal; Notable for the following components:      Result Value   RBC 3.77 (*)    Hemoglobin 12.8 (*)    HCT 38.8 (*)    MCV 102.9 (*)    All other components within normal limits  BASIC METABOLIC PANEL    EKG None  Radiology CT Head Wo Contrast  Result Date:  03/27/2019 CLINICAL DATA:  Fall loss of equilibrium EXAM: CT HEAD WITHOUT CONTRAST TECHNIQUE: Contiguous axial images were obtained from the base of the skull through the vertex without intravenous contrast. COMPARISON:  None. FINDINGS: Brain: No acute territorial infarction, hemorrhage or intracranial mass.  Moderate atrophy. Moderate patchy hypodensity in the bilateral white matter consistent with chronic small vessel ischemic change. Probable chronic lacunar infarct in the right thalamus. Mildly prominent ventricles felt secondary to atrophy Vascular: No hyperdense vessels.  Carotid vascular calcification Skull: Normal. Negative for fracture or focal lesion. Sinuses/Orbits: No acute finding. Other: None IMPRESSION: 1. No CT evidence for acute intracranial abnormality. 2. Atrophy and chronic small vessel ischemic change of the white matter Electronically Signed   By: Donavan Foil M.D.   On: 03/27/2019 16:30    Procedures Procedures (including critical care time)  Medications Ordered in ED Medications - No data to display  ED Course  I have reviewed the triage vital signs and the nursing notes.  Pertinent labs & imaging results that were available during my care of the patient were reviewed by me and considered in my medical decision making (see chart for details).  Clinical Course as of Mar 26 1720  Thu Mar 27, 2019  1659 Normal  Basic metabolic panel [EW]  5732 Normal except hemoglobin low, MCV high  CBC with Differential(!) [EW]  1659 Per radiologist, no acute intracranial abnormality.  CT Head Wo Contrast [EW]    Clinical Course User Index [EW] Daleen Bo, MD   MDM Rules/Calculators/A&P                       Patient Vitals for the past 24 hrs:  BP Temp Temp src Pulse Resp SpO2 Height Weight  03/27/19 1600 (!) 159/80 -- -- 62 19 99 % -- --  03/27/19 1455 (!) 147/73 -- -- 62 18 98 % -- --  03/27/19 1234 -- -- -- -- -- -- 5\' 9"  (1.753 m) 77.1 kg  03/27/19 1233 (!) 140/57 97.6  F (36.4 C) Oral 78 15 100 % -- --    5:18 PM Reevaluation with update and discussion. After initial assessment and treatment, an updated evaluation reveals patient states he feels about the same.  He was able to ambulate with me, about 75 feet, 2 ways, independently.  He did give me additional history at this time that he has actually had similar trouble for several months, and seen his doctor for it who referred him to physical therapy.  While he stated earlier that he played golf a lot he now states he is only played twice in the last 6 months.  Findings discussed with the patient and all questions were answered. Daleen Bo   Medical Decision Making: Nonspecific chronic weakness and dizziness, is likely multifactorial.  Doubt CVA, suspect infection, metabolic instability or impending vascular collapse.  Patient is already seen his PCP for this problem, and does not have any significant worsening.  He is able to ambulate in the ED independently, and has shown that he can tolerate the status, in his home setting, at this time.  There is no indication for hospitalization at this time.  Ronald Gutierrez was evaluated in Emergency Department on 03/27/2019 for the symptoms described in the history of present illness. He was evaluated in the context of the global COVID-19 pandemic, which necessitated consideration that the patient might be at risk for infection with the SARS-CoV-2 virus that causes COVID-19. Institutional protocols and algorithms that pertain to the evaluation of patients at risk for COVID-19 are in a state of rapid change based on information released by regulatory bodies including the CDC and federal and state organizations. These policies and algorithms were followed during the patient's care in the ED.  CRITICAL CARE-no Performed by: Daleen Bo   Nursing Notes Reviewed/ Care Coordinated Applicable Imaging Reviewed Interpretation of Laboratory Data incorporated into ED treatment  The  patient appears reasonably screened and/or stabilized for discharge and I doubt any other medical condition or other [facility] requiring further screening, evaluation, or treatment in the ED at this time prior to discharge.  Plan: Home Medications-continue usual home; Home Treatments-rest, fluids; return here if the recommended treatment, does not improve the symptoms; Recommended follow up-PCP follow-up soon as possible further management treatment.     Final Clinical Impression(s) / ED Diagnoses Final diagnoses:  Dizziness  Weakness    Rx / DC Orders ED Discharge Orders    None       Daleen Bo, MD 03/27/19 1722

## 2019-03-28 NOTE — Telephone Encounter (Signed)
FYI

## 2019-03-29 ENCOUNTER — Other Ambulatory Visit: Payer: Self-pay | Admitting: Family Medicine

## 2019-05-09 ENCOUNTER — Encounter: Payer: Self-pay | Admitting: Neurology

## 2019-05-09 ENCOUNTER — Encounter: Payer: Self-pay | Admitting: Family Medicine

## 2019-05-09 ENCOUNTER — Other Ambulatory Visit: Payer: Self-pay

## 2019-05-09 ENCOUNTER — Ambulatory Visit (INDEPENDENT_AMBULATORY_CARE_PROVIDER_SITE_OTHER): Payer: Medicare HMO | Admitting: Family Medicine

## 2019-05-09 VITALS — BP 120/62 | HR 75 | Temp 97.7°F | Wt 175.0 lb

## 2019-05-09 DIAGNOSIS — R531 Weakness: Secondary | ICD-10-CM

## 2019-05-09 DIAGNOSIS — Z9181 History of falling: Secondary | ICD-10-CM

## 2019-05-09 DIAGNOSIS — G629 Polyneuropathy, unspecified: Secondary | ICD-10-CM | POA: Diagnosis not present

## 2019-05-09 NOTE — Patient Instructions (Signed)

## 2019-05-09 NOTE — Progress Notes (Signed)
Subjective:     Patient ID: Ronald Gutierrez, male   DOB: [birth year], 86 y.o.   MRN: [id number]  HPI Ronald Gutierrez has history of hypertension, remote history of colon cancer, hypothyroidism, hyperlipidemia.  Up until about a year ago he was very active with playing golf several times per week and has only played twice in the past 6 months.  Has had issues with balance.  No recent falls.  He is complaining of weakness in both legs.  He was seen in the ER recently on 11 March with some dizziness as his primary concern.  He had CT of the head which showed no acute abnormalities.  CBC and basic metabolic panel were unremarkable.  He has hypothyroidism and is followed by endocrinology.  He had recent hyponatremia and was ruled out for adrenal insufficiency.  With some mild fluid restriction his sodium has stabilized.  Patient relates that past 2 months he has had some numbness in his legs and feet.  He had been drinking whiskey estimated 4 to 8 ounces per day until recently and has scaled this back.  He has no history of diabetes.  Recent B12 level normal.  Thyroid at goal.  Denies low back pain.  He has some urine urgency but no urine or stool incontinence.   He has had previous physical therapy for his balance issues and weakness and the therapy has helped some.  He continues to do some home exercises.  He does have history of Guillain- Barr syndrome back in 1981.  Past Medical History:  Diagnosis Date  . ANEMIA   . Colon adenocarcinoma ([place]) 05/2008   T4, N0  . Diverticulitis   . FATIGUE 05/13/2009  . GERD 05/25/2008  . Guillain-Barre syndrome ([place])   . HYPERLIPIDEMIA 05/20/2008  . HYPERTENSION 05/20/2008  . Hypothyroidism   . KERATOSIS 10/21/2008  . Tubular adenoma of colon 10/2009   Past Surgical History:  Procedure Laterality Date  . APPENDECTOMY  1948  . CATARACT EXTRACTION    . CERVICAL LAMINECTOMY  1968  . COLECTOMY  07/2008   transverse  . HEMORRHOID SURGERY    . HERNIA REPAIR     inguinal  .  LUMBAR LAMINECTOMY  1970  . OMENTECTOMY      reports that he quit smoking about 51 years ago. His smoking use included cigarettes. He has a 22.50 pack-year smoking history. He has never used smokeless tobacco. He reports current alcohol use. He reports that he does not use drugs. family history includes Heart disease in his brother; Lung cancer in his brother; Prostate cancer in his brother; Stroke in his father. Allergies  Allergen Reactions  . Ferrous Sulfate     REACTION: Stomach upset     Review of Systems  Constitutional: Positive for fatigue. Negative for chills and fever.  Respiratory: Negative for cough and shortness of breath.   Cardiovascular: Negative for chest pain, palpitations and leg swelling.  Gastrointestinal: Negative for abdominal pain.  Musculoskeletal: Negative for back pain.  Neurological: Positive for weakness and numbness. Negative for syncope, speech difficulty and headaches.       Objective:   Physical Exam Vitals reviewed.  Constitutional:      Appearance: Normal appearance.  Cardiovascular:     Rate and Rhythm: Normal rate and regular rhythm.  Pulmonary:     Effort: Pulmonary effort is normal.     Comments: He has some crackles in both bases. Musculoskeletal:     Right lower leg: No edema.  Left lower leg: No edema.  Skin:    Comments: He has good capillary refill in both feet  Neurological:     Mental Status: He is alert.     Comments: He has generalized weakness lower extremities bilaterally with hip flexion, knee extension, plantarflexion, and dorsiflexion bilaterally.  He has no significant reflexes knee or ankle bilaterally.  He has impairment with monofilament testing in both feet and legs        Assessment:     Patient presents with at least few month history of progressive numbness and possibly weakness lower extremities.  This in the setting of several months of progressive decline overall in activities.  Previous B12 levels  normal.  He has hypothyroidism on replacement.  No history of diabetes.  He has history of alcohol use as above which could be contributing to his neuropathy symptoms    Plan:     -Check myeloma panel and sed rate  -Set up neurology referral to further evaluate his neuropathy which has been progressive recently.  -Continue to scale back alcohol  -We strongly advised the use at least a cane at all times to reduce fall risk.  He uses this most of the time at home but not consistently.  He will continue to do home PT exercises he has been previously instructed in.  Eulas Post MD [facility]

## 2019-05-10 LAB — SEDIMENTATION RATE: Sed Rate: 22 mm/hr (ref 0–30)

## 2019-05-13 ENCOUNTER — Telehealth: Payer: Self-pay | Admitting: Family Medicine

## 2019-05-13 NOTE — Telephone Encounter (Signed)
Spoke to [employer] and she explained that we cannot guarantee if she sends a referral somewhere else that he will get a sooner appt and could possibly push him out further. She also stated that he is on the cancellation list at Neurology if something becomes available. Informed the pt this information and he verbalized an understanding and did not want a new referral sent

## 2019-05-13 NOTE — Telephone Encounter (Signed)
Pt is unable to get an appt with Neurology until July and he is wondering if PCP can send a referral to a different place to be seen earlier. Will speak to [employer].     Pt can be reached at [phone]

## 2019-05-15 LAB — MULTIPLE MYELOMA PANEL, SERUM
Albumin SerPl Elph-Mcnc: 3.8 g/dL (ref 2.9–4.4)
Albumin/Glob SerPl: 1.1 (ref 0.7–1.7)
Alpha 1: 0.3 g/dL (ref 0.0–0.4)
Alpha2 Glob SerPl Elph-Mcnc: 0.9 g/dL (ref 0.4–1.0)
B-Globulin SerPl Elph-Mcnc: 1.2 g/dL (ref 0.7–1.3)
Gamma Glob SerPl Elph-Mcnc: 1.4 g/dL (ref 0.4–1.8)
Globulin, Total: 3.7 g/dL (ref 2.2–3.9)
IgA/Immunoglobulin A, Serum: 267 mg/dL (ref 61–437)
IgG (Immunoglobin G), Serum: 1544 mg/dL (ref 603–1613)
IgM (Immunoglobulin M), Srm: 99 mg/dL (ref 15–143)
Total Protein: 7.5 g/dL (ref 6.0–8.5)

## 2019-06-05 ENCOUNTER — Other Ambulatory Visit: Payer: Self-pay | Admitting: Family Medicine

## 2019-06-06 NOTE — Telephone Encounter (Signed)
Pt calling to follow up on medication refill on Omeprazole 20 mg. Pt only has 1 pill left and is concerned because he will not have any medication for the weekend. Thanks

## 2019-06-19 ENCOUNTER — Ambulatory Visit: Payer: Medicare HMO | Admitting: Neurology

## 2019-06-19 ENCOUNTER — Encounter: Payer: Self-pay | Admitting: Neurology

## 2019-06-19 ENCOUNTER — Other Ambulatory Visit (INDEPENDENT_AMBULATORY_CARE_PROVIDER_SITE_OTHER): Payer: Medicare HMO

## 2019-06-19 ENCOUNTER — Other Ambulatory Visit: Payer: Self-pay

## 2019-06-19 VITALS — BP 148/87 | HR 77 | Ht 68.0 in | Wt 171.2 lb

## 2019-06-19 DIAGNOSIS — N009 Acute nephritic syndrome with unspecified morphologic changes: Secondary | ICD-10-CM | POA: Diagnosis not present

## 2019-06-19 NOTE — Patient Instructions (Addendum)
1.  Check Celiac, Sjogren's, B6, ACE, Lyme. Your provider has requested that you have labwork completed today. Please go to [facility] ([phone]) on the second floor of this building before leaving the office today. You do not need to check in. If you are not called within 15 minutes please check with the front desk.  2. Nerve study of left upper and lower extremities. [facility] [phone] 3. May need to consider spinal tap.

## 2019-06-19 NOTE — Progress Notes (Signed)
NEUROLOGY CONSULTATION NOTE  Ronald Gutierrez MRN: [id number] DOB: [birth year]  Referring provider: Carolann Littler, MD Primary care provider: Carolann Littler, MD  Reason for consult:  neuropathy  HISTORY OF PRESENT ILLNESS: Ronald Gutierrez is a 86 year old right-handed Caucasian male with HTN, hypothyroidism, and history of colon cancer and Guillain-Barre Syndrome who presents for neuropathy.  History supplemented by referring provider note.  He is accompanied by his wife who supplements history.  Beginning in January, he started noticing problems with balance.  He has always played golf.  He started playing golf again in January, after taking a break since November due to weather, and noted that he felt unsteady.  No dizziness.  He had trouble feeling the ground.  His legs also felt weak.  No back pain.  No bowel or bladder dysfunction.  Denies double vision.  He had some dizziness. Numbness progressed from the bottom of his feet up to the ankles.  He also started experiencing burning pain in his heels.  He had to stop playing golf and started using a cane.  He has had 4 falls in the past 3 months.  He notes occasional tingling in his finger tips.  He has hypothyroidism which is adequately treated (TSH and free T4 from January was 0.75 and 1.05 respectively).  B12 level from December was 458.  SPEP/IFE from April was unremarkable and sed rate was 22.  He has no known diabetes.  He had Guillain-Barre syndrome in 1981, in which he was on a vent.  Overall, he recovered from that.  He had his COVID-19 vaccine in February but onset of symptoms began prior to that.  No known family history of neuropathy.  CT head on 03/27/2019 personally reviewd showed atrophy and chronic small vessel ischemic changes but no acute intracranial abnormality.   PAST MEDICAL HISTORY: Past Medical History:  Diagnosis Date  . ANEMIA   . Colon adenocarcinoma ([place]) 05/2008   T4, N0  . Diverticulitis   . FATIGUE 05/13/2009  .  GERD 05/25/2008  . Guillain-Barre syndrome ([place])   . HYPERLIPIDEMIA 05/20/2008  . HYPERTENSION 05/20/2008  . Hypothyroidism   . KERATOSIS 10/21/2008  . Tubular adenoma of colon 10/2009    PAST SURGICAL HISTORY: Past Surgical History:  Procedure Laterality Date  . APPENDECTOMY  1948  . CATARACT EXTRACTION    . CERVICAL LAMINECTOMY  1968  . COLECTOMY  07/2008   transverse  . HEMORRHOID SURGERY    . HERNIA REPAIR     inguinal  . LUMBAR LAMINECTOMY  1970  . OMENTECTOMY      MEDICATIONS: Current Outpatient Medications on File Prior to Visit  Medication Sig Dispense Refill  . aspirin 81 MG tablet Take 81 mg by mouth daily.    Marland Kitchen azelastine (ASTELIN) 0.1 % nasal spray SPRAY ONE SPRAY IN EACH NOSTRIL TWICE DAILY AS DIRECTED (Patient taking differently: Place 1 spray into both nostrils 2 (two) times daily as needed for allergies. ) 30 mL 1  . fexofenadine (ALLEGRA) 180 MG tablet Take 180 mg by mouth daily.    Marland Kitchen levothyroxine (SYNTHROID) 75 MCG tablet Take 1 tablet (75 mcg total) by mouth daily. 90 tablet 3  . lisinopril (ZESTRIL) 20 MG tablet Take 1 tablet (20 mg total) by mouth daily. 90 tablet 3  . Multiple Vitamins-Minerals (CENTRUM ULTRA MENS PO) Take 1 tablet by mouth daily.      Marland Kitchen omeprazole (PRILOSEC) 20 MG capsule TAKE ONE CAPSULE BY MOUTH DAILY 90 capsule 2  .  simvastatin (ZOCOR) 20 MG tablet TAKE ONE TABLET BY MOUTH AT BEDTIME 90 tablet 0  . triamcinolone (NASACORT ALLERGY 24HR) 55 MCG/ACT AERO nasal inhaler Place 2 sprays into the nose daily as needed (congestion).      No current facility-administered medications on file prior to visit.    ALLERGIES: Allergies  Allergen Reactions  . Ferrous Sulfate     REACTION: Stomach upset    FAMILY HISTORY: Family History  Problem Relation Age of Onset  . Lung cancer Brother   . Heart disease Brother   . Prostate cancer Brother   . Stroke Father     SOCIAL HISTORY: Social History   Socioeconomic History  . Marital status:  Married    Spouse name: Not on file  . Number of children: Not on file  . Years of education: Not on file  . Highest education level: Not on file  Occupational History  . Occupation: Retired    Fish farm manager: RETIRED  Tobacco Use  . Smoking status: Former Smoker    Packs/day: 1.50    Years: 15.00    Pack years: 22.50    Types: Cigarettes    Quit date: 01/17/1968    Years since quitting: 51.4  . Smokeless tobacco: Never Used  . Tobacco comment: aged out of preventive screens  Substance and Sexual Activity  . Alcohol use: Yes  . Drug use: No  . Sexual activity: Not on file  Other Topics Concern  . Not on file  Social History Narrative  . Not on file   Social Determinants of Health   Financial Resource Strain:   . Difficulty of Paying Living Expenses:   Food Insecurity:   . Worried About Charity fundraiser in the Last Year:   . Arboriculturist in the Last Year:   Transportation Needs:   . Film/video editor (Medical):   Marland Kitchen Lack of Transportation (Non-Medical):   Physical Activity:   . Days of Exercise per Week:   . Minutes of Exercise per Session:   Stress:   . Feeling of Stress :   Social Connections:   . Frequency of Communication with Friends and Family:   . Frequency of Social Gatherings with Friends and Family:   . Attends Religious Services:   . Active Member of Clubs or Organizations:   . Attends Archivist Meetings:   Marland Kitchen Marital Status:   Intimate Partner Violence:   . Fear of Current or Ex-Partner:   . Emotionally Abused:   Marland Kitchen Physically Abused:   . Sexually Abused:     REVIEW OF SYSTEMS: Constitutional: No fevers, chills, or sweats, no generalized fatigue, change in appetite Eyes: No visual changes, double vision, eye pain Ear, nose and throat: No hearing loss, ear pain, nasal congestion, sore throat Cardiovascular: No chest pain, palpitations Respiratory:  No shortness of breath at rest or with exertion, wheezes GastrointestinaI: No nausea,  vomiting, diarrhea, abdominal pain, fecal incontinence Genitourinary:  No dysuria, urinary retention or frequency Musculoskeletal:  No neck pain, back pain Integumentary: No rash, pruritus, skin lesions Neurological: as above Psychiatric: No depression, insomnia, anxiety Endocrine: No palpitations, fatigue, diaphoresis, mood swings, change in appetite, change in weight, increased thirst Hematologic/Lymphatic:  No purpura, petechiae. Allergic/Immunologic: no itchy/runny eyes, nasal congestion, recent allergic reactions, rashes  PHYSICAL EXAM: Blood pressure (!) 148/87, pulse 77, height 5\' 8"  (1.727 m), weight 171 lb 3.2 oz (77.7 kg), SpO2 96 %. General: No acute distress.  Patient appears well-groomed.  Head:  Normocephalic/atraumatic Eyes:  fundi examined but not visualized Neck: supple, no paraspinal tenderness, full range of motion Back: No paraspinal tenderness Heart: regular rate and rhythm Lungs: Clear to auscultation bilaterally. Vascular: No carotid bruits. Neurological Exam: Mental status: alert and oriented to person, place, and time, recent and remote memory intact, fund of knowledge intact, attention and concentration intact, speech fluent and not dysarthric, language intact. Cranial nerves: CN I: not tested CN II: pupils equal, round and reactive to light, visual fields intact CN III, IV, VI:  full range of motion, no nystagmus, no ptosis CN V: facial sensation intact CN VII: upper and lower face symmetric CN VIII: hearing intact CN IX, X: gag intact, uvula midline CN XI: sternocleidomastoid and trapezius muscles intact CN XII: tongue midline Bulk & Tone: normal, no fasciculations. Motor:  5/5 throughout  Sensation:  Pinprick sensation reduced in upper extremities above shoulders and lower extremities up to above knees; vibration and proprioception sensation reduced up to ankles. Deep Tendon Reflexes:  1+ upper extremities, absent lower extremities, toes  downgoing. Finger to nose testing:  Without dysmetria.  Heel to shin:  Without dysmetria.  Gait:  Wide-based unsteady gait.  Unable to ambulate independently without assistance.  Romberg positive.  IMPRESSION: Subacute onset of progressive neuropathy with remote history of Guillain-Barre syndrome.  Unclear if this is a new CIDP.  Symptoms precede COVID vaccine.  No diabetes, thyroid stable, no amyloid.  PLAN: 1.  Will attempt to get a NCV-EMG as soon as possible 2.  Check labs today:  ACE, Lyme, B6, Sjogren's, Celiac 3.  May vs likely will need to perform lumbar puncture for CSF analysis. 4.  Follow up after testing.  Further recommendations pending results.  Thank you for allowing me to take part in the care of this patient.  Metta Clines, DO  CC: Carolann Littler, MD

## 2019-06-22 LAB — SJOGREN'S SYNDROME ANTIBODS(SSA + SSB)
SSA (Ro) (ENA) Antibody, IgG: <1 AI
SSB (La) (ENA) Antibody, IgG: <1 AI

## 2019-06-22 LAB — ANGIOTENSIN CONVERTING ENZYME: Angiotensin-Converting Enzyme: <5 U/L — ABNORMAL LOW (ref 9–67)

## 2019-06-22 LAB — CELIAC DISEASE PANEL
(tTG) Ab, IgA: 1 U/mL
(tTG) Ab, IgG: 28 U/mL — ABNORMAL HIGH
Gliadin IgA: 3 Units
Gliadin IgG: 2 Units
Immunoglobulin A: 271 mg/dL (ref 70–320)

## 2019-06-22 LAB — VITAMIN B6: Vitamin B6: 17.6 ng/mL (ref 2.1–21.7)

## 2019-06-24 ENCOUNTER — Encounter: Payer: Medicare HMO | Admitting: Neurology

## 2019-06-24 ENCOUNTER — Other Ambulatory Visit: Payer: Self-pay | Admitting: Family Medicine

## 2019-06-24 ENCOUNTER — Ambulatory Visit (INDEPENDENT_AMBULATORY_CARE_PROVIDER_SITE_OTHER): Payer: Medicare HMO | Admitting: Neurology

## 2019-06-24 DIAGNOSIS — N009 Acute nephritic syndrome with unspecified morphologic changes: Secondary | ICD-10-CM

## 2019-06-24 DIAGNOSIS — R202 Paresthesia of skin: Secondary | ICD-10-CM | POA: Diagnosis not present

## 2019-06-24 DIAGNOSIS — Z0289 Encounter for other administrative examinations: Secondary | ICD-10-CM

## 2019-06-24 DIAGNOSIS — R269 Unspecified abnormalities of gait and mobility: Secondary | ICD-10-CM
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
## 2019-06-24 NOTE — Progress Notes (Signed)
EMG/NCS report is under procedure tab

## 2019-06-25 ENCOUNTER — Other Ambulatory Visit: Payer: Self-pay

## 2019-06-25 DIAGNOSIS — M4807 Spinal stenosis, lumbosacral region: Secondary | ICD-10-CM

## 2019-06-25 NOTE — Progress Notes (Signed)
Pt and Wife aware of  EMG results and MRI of the Cervical and Lumbar ordered.

## 2019-06-26 ENCOUNTER — Telehealth: Payer: Self-pay | Admitting: Neurology

## 2019-06-26 NOTE — Telephone Encounter (Signed)
Pt wife called and state that [facility] cant get the patient in for a MRI until 07-29-19 and they are on a wait list. She wanted him to be aware of this and also see if they could get in somewhere else sooner

## 2019-06-26 NOTE — Telephone Encounter (Signed)
MRI changed to [facility] 07/09/19 12pm at waiting on PA to be changed.

## 2019-06-27 ENCOUNTER — Other Ambulatory Visit: Payer: Self-pay | Admitting: Family Medicine

## 2019-07-01 ENCOUNTER — Encounter: Payer: Self-pay | Admitting: Neurology

## 2019-07-02 ENCOUNTER — Encounter (HOSPITAL_COMMUNITY): Payer: Self-pay

## 2019-07-02 ENCOUNTER — Emergency Department (HOSPITAL_COMMUNITY): Payer: Medicare HMO

## 2019-07-02 ENCOUNTER — Other Ambulatory Visit: Payer: Self-pay

## 2019-07-02 ENCOUNTER — Emergency Department (HOSPITAL_COMMUNITY)
Admission: EM | Admit: 2019-07-02 | Discharge: 2019-07-02 | Disposition: A | Discharge status: Home / Self Care | Payer: Medicare HMO | Attending: Emergency Medicine | Admitting: Emergency Medicine

## 2019-07-02 DIAGNOSIS — Z79899 Other long term (current) drug therapy: Secondary | ICD-10-CM | POA: Diagnosis not present

## 2019-07-02 DIAGNOSIS — R0600 Dyspnea, unspecified: Secondary | ICD-10-CM

## 2019-07-02 DIAGNOSIS — R062 Wheezing: Secondary | ICD-10-CM

## 2019-07-02 DIAGNOSIS — R918 Other nonspecific abnormal finding of lung field: Secondary | ICD-10-CM | POA: Diagnosis not present

## 2019-07-02 DIAGNOSIS — I10 Essential (primary) hypertension: Secondary | ICD-10-CM | POA: Diagnosis not present

## 2019-07-02 DIAGNOSIS — R0609 Other forms of dyspnea: Secondary | ICD-10-CM | POA: Insufficient documentation

## 2019-07-02 DIAGNOSIS — J181 Lobar pneumonia, unspecified organism: Secondary | ICD-10-CM | POA: Diagnosis not present

## 2019-07-02 DIAGNOSIS — Z87891 Personal history of nicotine dependence: Secondary | ICD-10-CM | POA: Diagnosis not present

## 2019-07-02 DIAGNOSIS — E785 Hyperlipidemia, unspecified: Secondary | ICD-10-CM | POA: Diagnosis not present

## 2019-07-02 DIAGNOSIS — C383 Malignant neoplasm of mediastinum, part unspecified: Secondary | ICD-10-CM | POA: Diagnosis not present

## 2019-07-02 DIAGNOSIS — J811 Chronic pulmonary edema: Secondary | ICD-10-CM | POA: Diagnosis not present

## 2019-07-02 DIAGNOSIS — E039 Hypothyroidism, unspecified: Secondary | ICD-10-CM | POA: Diagnosis not present

## 2019-07-02 DIAGNOSIS — J810 Acute pulmonary edema: Secondary | ICD-10-CM | POA: Diagnosis not present

## 2019-07-02 LAB — COMPREHENSIVE METABOLIC PANEL
ALT: 21 U/L (ref 0–44)
AST: 27 U/L (ref 15–41)
Albumin: 4.4 g/dL (ref 3.5–5.0)
Alkaline Phosphatase: 83 U/L (ref 38–126)
Anion gap: 9 (ref 5–15)
BUN: 20 mg/dL (ref 8–23)
CO2: 26 mmol/L (ref 22–32)
Calcium: 9.1 mg/dL (ref 8.9–10.3)
Chloride: 95 mmol/L — ABNORMAL LOW (ref 98–111)
Creatinine, Ser: 0.99 mg/dL (ref 0.61–1.24)
GFR calc Af Amer: >60 mL/min (ref >60)
GFR calc non Af Amer: >60 mL/min (ref >60)
Glucose, Bld: 97 mg/dL (ref 70–99)
Potassium: 4.1 mmol/L (ref 3.5–5.1)
Sodium: 130 mmol/L — ABNORMAL LOW (ref 135–145)
Total Bilirubin: 0.6 mg/dL (ref 0.3–1.2)
Total Protein: 8.5 g/dL — ABNORMAL HIGH (ref 6.5–8.1)

## 2019-07-02 LAB — CBC WITH DIFFERENTIAL/PLATELET
Abs Immature Granulocytes: 0.02 10*3/uL (ref 0.00–0.07)
Basophils Absolute: 0.1 10*3/uL (ref 0.0–0.1)
Basophils Relative: 1 %
Eosinophils Absolute: 0.4 10*3/uL (ref 0.0–0.5)
Eosinophils Relative: 4 %
HCT: 41.1 % (ref 39.0–52.0)
Hemoglobin: 14 g/dL (ref 13.0–17.0)
Immature Granulocytes: 0 %
Lymphocytes Relative: 15 %
Lymphs Abs: 1.5 10*3/uL (ref 0.7–4.0)
MCH: 33.4 pg (ref 26.0–34.0)
MCHC: 34.1 g/dL (ref 30.0–36.0)
MCV: 98.1 fL (ref 80.0–100.0)
Monocytes Absolute: 1 10*3/uL (ref 0.1–1.0)
Monocytes Relative: 10 %
Neutro Abs: 6.7 10*3/uL (ref 1.7–7.7)
Neutrophils Relative %: 70 %
Platelets: 382 10*3/uL (ref 150–400)
RBC: 4.19 MIL/uL — ABNORMAL LOW (ref 4.22–5.81)
RDW: 12.3 % (ref 11.5–15.5)
WBC: 9.7 10*3/uL (ref 4.0–10.5)
nRBC: 0 % (ref 0.0–0.2)

## 2019-07-02 LAB — BRAIN NATRIURETIC PEPTIDE: B Natriuretic Peptide: 43.3 pg/mL (ref 0.0–100.0)

## 2019-07-02 MED ORDER — IOHEXOL 300 MG/ML  SOLN
75.0000 mL | Freq: Once | INTRAMUSCULAR | Status: AC | PRN
  Administered 2019-07-02: 75 mL via INTRAVENOUS

## 2019-07-02 MED ORDER — ALBUTEROL SULFATE HFA 108 (90 BASE) MCG/ACT IN AERS
2.0000 | INHALATION_SPRAY | Freq: Four times a day (QID) | RESPIRATORY_TRACT | Status: DC
  Administered 2019-07-02 (×2): 2 via RESPIRATORY_TRACT
  Filled 2019-07-02: qty 6.7

## 2019-07-02 MED ORDER — AZITHROMYCIN 250 MG PO TABS
500.0000 mg | ORAL_TABLET | Freq: Once | ORAL | Status: AC
  Administered 2019-07-02: 500 mg via ORAL
  Filled 2019-07-02: qty 2

## 2019-07-02 MED ORDER — AZITHROMYCIN 250 MG PO TABS
250.0000 mg | ORAL_TABLET | Freq: Every day | ORAL | 0 refills | Status: DC
Start: 2019-07-02 — End: 2019-07-30

## 2019-07-02 NOTE — ED Provider Notes (Signed)
[facility] DEPT Provider Note   CSN: [id number] Arrival date & time: 07/02/19  1431     History Chief Complaint  Patient presents with  . Wheezing    Ronald Gutierrez is a 86 y.o. male.  HPI    Patient presents with concern of wheezing, dyspnea, minimal rest, worse with exertion.  Onset was a few days ago, without clear precipitant, no recent changes in medication, diet, activity.  Patient did stop drinking alcohol a few months ago.  He stopped smoking 54 years ago.  No chest pain either at rest or with exertion, no fever, no vomiting, no cough.  He has received his coronavirus vaccines.   Past Medical History:  Diagnosis Date  . ANEMIA   . Colon adenocarcinoma ([place]) 05/2008   T4, N0  . Diverticulitis   . FATIGUE 05/13/2009  . GERD 05/25/2008  . Guillain-Barre syndrome ([place])   . HYPERLIPIDEMIA 05/20/2008  . HYPERTENSION 05/20/2008  . Hypothyroidism   . KERATOSIS 10/21/2008  . Tubular adenoma of colon 10/2009    Patient Active Problem List   Diagnosis Date Noted  . Gait abnormality 06/24/2019  . Paresthesia 06/24/2019  . History of Guillain-Barre syndrome 12/03/2013  . History of malignant neoplasm of large intestine 09/22/2009  . KERATOSIS 10/21/2008  . 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 GERD 05/25/2008  . PERSONAL HX COLONIC POLYPS 05/25/2008  . Hypothyroidism 05/20/2008  . Hyperlipidemia 05/20/2008  . ANEMIA 05/20/2008  . Essential hypertension 05/20/2008    Past Surgical History:  Procedure Laterality Date  . APPENDECTOMY  1948  . CATARACT EXTRACTION    . CERVICAL LAMINECTOMY  1968  . COLECTOMY  07/2008   transverse  . HEMORRHOID SURGERY    . HERNIA REPAIR     inguinal  . LUMBAR LAMINECTOMY  1970  . OMENTECTOMY         Family History  Problem Relation Age of Onset  . Lung cancer Brother   . Heart disease Brother   . Prostate cancer Brother   . Stroke Father     Social History   Tobacco Use  .  Smoking status: Former Smoker    Packs/day: 1.50    Years: 15.00    Pack years: 22.50    Types: Cigarettes    Quit date: 01/17/1968    Years since quitting: 51.4  . Smokeless tobacco: Never Used  . Tobacco comment: aged out of preventive screens  Vaping Use  . Vaping Use: Never used  Substance Use Topics  . Alcohol use: Yes  . Drug use: No    Home Medications Prior to Admission medications   Medication Sig Start Date End Date Taking? Authorizing Provider  aspirin 81 MG tablet Take 81 mg by mouth daily.    [provider]  azelastine (ASTELIN) 0.1 % nasal spray SPRAY ONE SPRAY IN EACH NOSTRIL TWICE DAILY AS DIRECTED Patient not taking: No sig reported 11/13/18   Burchette, Alinda Sierras, MD  fexofenadine (ALLEGRA) 180 MG tablet Take 180 mg by mouth daily.    [provider]  levothyroxine (SYNTHROID) 75 MCG tablet Take 1 tablet (75 mcg total) by mouth daily. 12/25/18   Philemon Kingdom, MD  lisinopril (ZESTRIL) 20 MG tablet TAKE ONE TABLET BY MOUTH DAILY 06/24/19   Burchette, Alinda Sierras, MD  Multiple Vitamins-Minerals (CENTRUM ULTRA MENS PO) Take 1 tablet by mouth daily.      [provider]  omeprazole (PRILOSEC) 20 MG capsule TAKE ONE CAPSULE BY MOUTH  DAILY 06/09/19   Burchette, Alinda Sierras, MD  simvastatin (ZOCOR) 20 MG tablet TAKE ONE TABLET BY MOUTH AT BEDTIME 06/27/19   Burchette, Alinda Sierras, MD  triamcinolone (NASACORT ALLERGY 24HR) 55 MCG/ACT AERO nasal inhaler Place 2 sprays into the nose daily as needed (congestion).     [provider]    Allergies    Ferrous sulfate  Review of Systems   Review of Systems  Constitutional:       Per HPI, otherwise negative  HENT:       Per HPI, otherwise negative  Respiratory:       Per HPI, otherwise negative  Cardiovascular:       Per HPI, otherwise negative  Gastrointestinal: Negative for vomiting.  Endocrine:       Negative aside from HPI  Genitourinary:       Neg aside from HPI   Musculoskeletal:       Per  HPI, otherwise negative  Skin: Negative.   Neurological: Positive for numbness (neuropathy). Negative for syncope.    Physical Exam Updated Vital Signs BP (!) 144/77 (BP Location: Right Arm)   Pulse 77   Resp 16   SpO2 97%   Physical Exam Vitals and nursing note reviewed.  Constitutional:      General: He is not in acute distress.    Appearance: He is well-developed.  HENT:     Head: Normocephalic and atraumatic.  Eyes:     Conjunctiva/sclera: Conjunctivae normal.  Cardiovascular:     Rate and Rhythm: Normal rate and regular rhythm.  Pulmonary:     Effort: Pulmonary effort is normal.     Breath sounds: Wheezing present.  Abdominal:     General: There is no distension.  Skin:    General: Skin is warm and dry.  Neurological:     Mental Status: He is alert and oriented to person, place, and time.      ED Results / Procedures / Treatments   Labs (all labs ordered are listed, but only abnormal results are displayed) Labs Reviewed  COMPREHENSIVE METABOLIC PANEL  CBC WITH DIFFERENTIAL/PLATELET  BRAIN NATRIURETIC PEPTIDE    EKG None  Radiology DG Chest 2 View  Result Date: 07/02/2019 CLINICAL DATA:  Wheezing EXAM: CHEST - 2 VIEW COMPARISON:  CT abdomen pelvis 06/18/2015, 07/18/2017, radiograph 08/23/2018, 05/14/2016 FINDINGS: New masslike consolidation seen in the periphery of the left chest with additional more focal opacity just medial. There are hazy and reticular opacities elsewhere throughout the lungs with central congestion including cephalized, indistinct pulmonary vascularity. No pneumothorax or effusion. Slight prominence of the cardiac silhouette though possibly related to portable technique. No acute osseous or soft tissue abnormality. Multilevel degenerative changes are present in the imaged portions of the spine. IMPRESSION: 1. New masslike consolidation in the periphery of the left chest with additional more focal opacity just medial. Recommend further  evaluation with contrast-enhanced CT. 2. Additional hazy and reticular opacities throughout the lungs with central congestion, could reflect pulmonary edema superimposed on a background of more chronic fibrosis. Electronically Signed   By: Lovena Le M.D.   On: 07/02/2019 15:38    Procedures Procedures (including critical care time)  Medications Ordered in ED Medications  albuterol (VENTOLIN HFA) 108 (90 Base) MCG/ACT inhaler 2 puff (has no administration in time range)    ED Course  I have reviewed the triage vital signs and the nursing notes.  Pertinent labs & imaging results that were available during my care of the patient were  reviewed by me and considered in my medical decision making (see chart for details).  With consideration of pneumonia versus bronchitis, versus other etiology for his wheezing, dyspnea on exertion, including heart failure, though this is less likely given the absence of edema,, patient had x-ray, labs ordered.    4:48 PM Patient joined by his wife.  I we discussed x-ray results, which I have reviewed, concern for opacification, though without clear etiology, CT scan pending.  This adult male with a history of working in a [employer] presents with new wheezing, dyspnea on exertion. Is awake, alert, afebrile, speaking clearly, but initial x-ray is concerning for lesion, requiring additional evaluation with CT scan. Patient will require follow-up on labs, CT scan, repeat assessment.  Dr. Stark Jock is aware of the patient.  Final Clinical Impression(s) / ED Diagnoses Final diagnoses:  DOE (dyspnea on exertion)  Wheezing   MDM Number of Diagnoses or Management Options DOE (dyspnea on exertion): new, needed workup Wheezing: new, needed workup   Amount and/or Complexity of Data Reviewed Clinical lab tests: reviewed Tests in the radiology section of CPT: reviewed Tests in the medicine section of CPT: reviewed Decide to obtain previous medical records or  to obtain history from someone other than the patient: yes Obtain history from someone other than the patient: yes Discuss the patient with other providers: yes Independent visualization of images, tracings, or specimens: yes  Risk of Complications, Morbidity, and/or Mortality Presenting problems: high Diagnostic procedures: high Management options: high  Critical Care Total time providing critical care: < 30 minutes  Patient Progress Patient progress: stable    Carmin Muskrat, MD 07/02/19 1711

## 2019-07-02 NOTE — Discharge Instructions (Signed)
Begin taking Zithromax as prescribed.  Use your albuterol inhaler, 2 puffs every 4 hours as needed for wheezing.  Follow-up with Dr. Lorenso Courier in the oncology clinic as recommended.  The office contact information has been provided in this discharge summary for you to call and make these arrangements.  Return to the ER for severe chest pain, worsening breathing, or other new and concerning symptoms.

## 2019-07-02 NOTE — ED Notes (Signed)
Pt wife wants to know when the doctor will be in to tell them the results from his scan

## 2019-07-02 NOTE — ED Triage Notes (Signed)
Patient brought in by wife.   C/o wheezing X3-4 days ago.   Denies shob at this time.   Hx. neuropathy   A/O Wheelchair in triage

## 2019-07-02 NOTE — Consult Note (Signed)
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 previously had colon cancer and underwent resection on 07/30/2008.  It was found to be an adenocarcinoma the colon K-ras wild-type.  Unfortunately he did not receive any adjuvant therapy at that time as he declined further treatment.  The patient does have a smoking history that is quite remote and he is 54 years removed.  A lung primary is certainly a consideration at this time as well.  After discussion with the patient he noted he wished to pursue a biopsy to know what type of cancer he currently has.  Patient is currently symptomatic with shortness of breath and cough.  The emergency department plans on discharging him with antibiotic therapy.  Fortunately there is no clear evidence of pneumonia on chest CT however given his symptoms I think it would be reasonable to do a short course of antibiotics.  If his symptoms worsen or he develops chest pain or worsening shortness of breath I requested that he return to [place] in the clinic.  We will order a PET CT scan and a bronchoscopy with biopsy and determine the next steps from there forward.   # Left Upper Lobe Lung Mass --findings are concerning for a recurrent metastatic colon cancer vs new lung primary.  --discussed with patient who noted  he wished to pursue a biopsy for diagnosis.  --will order a PET CT to determine the extent of disease --despite his advanced age the patient has a good functional status. I would be open to consideration of systemic therapy at this time. This can be considered further pending the results of the biopsy --plan to have patient f/u in clinic in approximately 2 weeks time.   #Colon Cancer s/p Resection -- in 07/30/2008 patient underwent resection of transverse colon mass. Noted to be [id number] --found to be adenocarcinoma of the colon, KRAS WT. No adjuvant therapy was pursued at the time.  --Preoperative CEA was 34.9 --possible recurrence, see above. Will order CEA with next clinic visit.   All questions were answered. The patient knows to call the clinic with any problems, questions or concerns.  A total of more than 55 minutes were spent on this encounter and over half of that time was  spent on counseling and coordination of care as outlined above.   Ledell Peoples, MD Department of Hematology/Oncology Industry at [facility] Phone: [phone] Pager: [phone] Email: [email or web address]  07/02/2019 8:23 PM

## 2019-07-02 NOTE — ED Provider Notes (Signed)
Care assumed from Dr. Vanita Panda at shift change.  Patient awaiting results of a CT scan of the chest to further evaluate for abnormal chest x-ray.  CT scan has returned and unfortunately shows what appears to be a mass with signs of mediastinal node involvement.  This finding was discussed with Dr. Lorenso Courier from oncology.  He has evaluated the patient here in the ER and feels as though outpatient follow-up is appropriate.  Whether the patient's lung mass is the cause of his presentation or an incidental finding, I am uncertain.  He presented with wheezing and productive cough more consistent with bronchitis.  Patient will be treated with Zithromax and an albuterol inhaler.  He is to follow-up with oncology as recommended.   Veryl Speak, MD 07/02/19 2007

## 2019-07-04 ENCOUNTER — Telehealth: Payer: Self-pay | Admitting: Neurology

## 2019-07-04 ENCOUNTER — Other Ambulatory Visit: Payer: Self-pay | Admitting: Hematology and Oncology

## 2019-07-04 DIAGNOSIS — C3490 Malignant neoplasm of unspecified part of unspecified bronchus or lung: Secondary | ICD-10-CM

## 2019-07-04 NOTE — Telephone Encounter (Signed)
FYI - wife didn't have anything further to add

## 2019-07-04 NOTE — Telephone Encounter (Signed)
Pt wife called and was given the phone to [facility] she wants to cancel the MRI for the patient at this time.   She also wants to let Dr Tomi Likens know that the patient was taken to the ED on Wednesday due to breathing problems. They found a mass in the left lung they want to focus on the mass in the left lung at this time. Test will be ran on the patient for the mass  Please call

## 2019-07-07 ENCOUNTER — Encounter: Payer: Self-pay | Admitting: *Deleted

## 2019-07-07 NOTE — Progress Notes (Signed)
I received notification that patient is a suspected lung cancer patient who saw Dr. Lorenso Courier.  Patient needs tissue dx and has a referral to pulmonary. I notified Dr. Valeta Harms of referral.

## 2019-07-08 ENCOUNTER — Encounter: Payer: Self-pay | Admitting: *Deleted

## 2019-07-08 NOTE — Progress Notes (Signed)
I followed up on Ronald Gutierrez appt for pulmonary team but did not see it. I reached out to Dr. Lamonte Sakai with an update.

## 2019-07-09 ENCOUNTER — Encounter: Payer: Self-pay | Admitting: *Deleted

## 2019-07-09 ENCOUNTER — Ambulatory Visit (HOSPITAL_COMMUNITY): Payer: Medicare HMO

## 2019-07-09 NOTE — Progress Notes (Signed)
Dr. Lamonte Sakai with pulmonary updated me that his office is working on an appt for patient. Will update Dr. Lorenso Courier.

## 2019-07-10 ENCOUNTER — Other Ambulatory Visit: Payer: Self-pay

## 2019-07-10 ENCOUNTER — Encounter (HOSPITAL_COMMUNITY)
Admission: RE | Admit: 2019-07-10 | Discharge: 2019-07-10 | Disposition: A | Discharge status: Home / Self Care | Payer: Medicare HMO | Source: Ambulatory Visit | Attending: Hematology and Oncology | Admitting: Hematology and Oncology

## 2019-07-10 DIAGNOSIS — I700 Atherosclerosis of aorta: Secondary | ICD-10-CM | POA: Insufficient documentation

## 2019-07-10 DIAGNOSIS — K449 Diaphragmatic hernia without obstruction or gangrene: Secondary | ICD-10-CM | POA: Insufficient documentation

## 2019-07-10 DIAGNOSIS — C7951 Secondary malignant neoplasm of bone: Secondary | ICD-10-CM | POA: Diagnosis not present

## 2019-07-10 DIAGNOSIS — J439 Emphysema, unspecified: Secondary | ICD-10-CM | POA: Insufficient documentation

## 2019-07-10 DIAGNOSIS — N400 Enlarged prostate without lower urinary tract symptoms: Secondary | ICD-10-CM | POA: Insufficient documentation

## 2019-07-10 DIAGNOSIS — C3490 Malignant neoplasm of unspecified part of unspecified bronchus or lung: Secondary | ICD-10-CM | POA: Diagnosis not present

## 2019-07-10 DIAGNOSIS — I2510 Atherosclerotic heart disease of native coronary artery without angina pectoris: Secondary | ICD-10-CM | POA: Insufficient documentation

## 2019-07-10 DIAGNOSIS — C3412 Malignant neoplasm of upper lobe, left bronchus or lung: Secondary | ICD-10-CM | POA: Diagnosis not present

## 2019-07-10 LAB — GLUCOSE, CAPILLARY: Glucose-Capillary: 100 mg/dL — ABNORMAL HIGH (ref 70–99)

## 2019-07-10 MED ORDER — FLUDEOXYGLUCOSE F - 18 (FDG) INJECTION
9.2500 | Freq: Once | INTRAVENOUS | Status: AC
  Administered 2019-07-10: 9.25 via INTRAVENOUS

## 2019-07-11 ENCOUNTER — Emergency Department (HOSPITAL_COMMUNITY): Payer: Medicare HMO

## 2019-07-11 ENCOUNTER — Encounter: Payer: Self-pay | Admitting: Emergency Medicine

## 2019-07-11 ENCOUNTER — Encounter (HOSPITAL_COMMUNITY): Payer: Self-pay | Admitting: Radiology

## 2019-07-11 ENCOUNTER — Ambulatory Visit: Payer: Medicare HMO | Admitting: Emergency Medicine

## 2019-07-11 ENCOUNTER — Other Ambulatory Visit: Payer: Self-pay

## 2019-07-11 ENCOUNTER — Encounter (HOSPITAL_COMMUNITY): Payer: Self-pay

## 2019-07-11 ENCOUNTER — Emergency Department (HOSPITAL_COMMUNITY)
Admission: EM | Admit: 2019-07-11 | Discharge: 2019-07-11 | Disposition: A | Discharge status: Home / Self Care | Payer: Medicare HMO | Attending: Emergency Medicine | Admitting: Emergency Medicine

## 2019-07-11 DIAGNOSIS — R202 Paresthesia of skin: Secondary | ICD-10-CM

## 2019-07-11 DIAGNOSIS — M47814 Spondylosis without myelopathy or radiculopathy, thoracic region: Secondary | ICD-10-CM | POA: Diagnosis not present

## 2019-07-11 DIAGNOSIS — C3490 Malignant neoplasm of unspecified part of unspecified bronchus or lung: Secondary | ICD-10-CM | POA: Diagnosis not present

## 2019-07-11 DIAGNOSIS — Z87891 Personal history of nicotine dependence: Secondary | ICD-10-CM | POA: Insufficient documentation

## 2019-07-11 DIAGNOSIS — C7951 Secondary malignant neoplasm of bone: Secondary | ICD-10-CM | POA: Diagnosis not present

## 2019-07-11 DIAGNOSIS — E871 Hypo-osmolality and hyponatremia: Secondary | ICD-10-CM | POA: Diagnosis not present

## 2019-07-11 DIAGNOSIS — Z7982 Long term (current) use of aspirin: Secondary | ICD-10-CM | POA: Insufficient documentation

## 2019-07-11 DIAGNOSIS — G9589 Other specified diseases of spinal cord: Secondary | ICD-10-CM | POA: Diagnosis not present

## 2019-07-11 DIAGNOSIS — M5021 Other cervical disc displacement,  high cervical region: Secondary | ICD-10-CM | POA: Diagnosis not present

## 2019-07-11 DIAGNOSIS — M5124 Other intervertebral disc displacement, thoracic region: Secondary | ICD-10-CM | POA: Diagnosis not present

## 2019-07-11 DIAGNOSIS — E039 Hypothyroidism, unspecified: Secondary | ICD-10-CM | POA: Diagnosis not present

## 2019-07-11 DIAGNOSIS — R918 Other nonspecific abnormal finding of lung field: Secondary | ICD-10-CM | POA: Diagnosis not present

## 2019-07-11 DIAGNOSIS — Z85038 Personal history of other malignant neoplasm of large intestine: Secondary | ICD-10-CM | POA: Insufficient documentation

## 2019-07-11 DIAGNOSIS — I10 Essential (primary) hypertension: Secondary | ICD-10-CM | POA: Diagnosis not present

## 2019-07-11 DIAGNOSIS — R531 Weakness: Secondary | ICD-10-CM | POA: Diagnosis not present

## 2019-07-11 DIAGNOSIS — Z79899 Other long term (current) drug therapy: Secondary | ICD-10-CM | POA: Diagnosis not present

## 2019-07-11 DIAGNOSIS — M4319 Spondylolisthesis, multiple sites in spine: Secondary | ICD-10-CM | POA: Diagnosis not present

## 2019-07-11 DIAGNOSIS — M4726 Other spondylosis with radiculopathy, lumbar region: Secondary | ICD-10-CM | POA: Diagnosis not present

## 2019-07-11 DIAGNOSIS — M48061 Spinal stenosis, lumbar region without neurogenic claudication: Secondary | ICD-10-CM | POA: Diagnosis not present

## 2019-07-11 DIAGNOSIS — M5116 Intervertebral disc disorders with radiculopathy, lumbar region: Secondary | ICD-10-CM | POA: Diagnosis not present

## 2019-07-11 DIAGNOSIS — M4802 Spinal stenosis, cervical region: Secondary | ICD-10-CM | POA: Diagnosis not present

## 2019-07-11 LAB — COMPREHENSIVE METABOLIC PANEL
ALT: 77 U/L — ABNORMAL HIGH (ref 0–44)
AST: 71 U/L — ABNORMAL HIGH (ref 15–41)
Albumin: 3.4 g/dL — ABNORMAL LOW (ref 3.5–5.0)
Alkaline Phosphatase: 106 U/L (ref 38–126)
Anion gap: 8 (ref 5–15)
BUN: 20 mg/dL (ref 8–23)
CO2: 25 mmol/L (ref 22–32)
Calcium: 8.8 mg/dL — ABNORMAL LOW (ref 8.9–10.3)
Chloride: 95 mmol/L — ABNORMAL LOW (ref 98–111)
Creatinine, Ser: 1 mg/dL (ref 0.61–1.24)
GFR calc Af Amer: >60 mL/min (ref >60)
GFR calc non Af Amer: >60 mL/min (ref >60)
Glucose, Bld: 108 mg/dL — ABNORMAL HIGH (ref 70–99)
Potassium: 4.4 mmol/L (ref 3.5–5.1)
Sodium: 128 mmol/L — ABNORMAL LOW (ref 135–145)
Total Bilirubin: 0.5 mg/dL (ref 0.3–1.2)
Total Protein: 8.2 g/dL — ABNORMAL HIGH (ref 6.5–8.1)

## 2019-07-11 LAB — CBC WITH DIFFERENTIAL/PLATELET
Abs Immature Granulocytes: 0.08 10*3/uL — ABNORMAL HIGH (ref 0.00–0.07)
Basophils Absolute: 0.1 10*3/uL (ref 0.0–0.1)
Basophils Relative: 1 %
Eosinophils Absolute: 0.4 10*3/uL (ref 0.0–0.5)
Eosinophils Relative: 4 %
HCT: 35.3 % — ABNORMAL LOW (ref 39.0–52.0)
Hemoglobin: 12 g/dL — ABNORMAL LOW (ref 13.0–17.0)
Immature Granulocytes: 1 %
Lymphocytes Relative: 11 %
Lymphs Abs: 1.1 10*3/uL (ref 0.7–4.0)
MCH: 33.7 pg (ref 26.0–34.0)
MCHC: 34 g/dL (ref 30.0–36.0)
MCV: 99.2 fL (ref 80.0–100.0)
Monocytes Absolute: 1.2 10*3/uL — ABNORMAL HIGH (ref 0.1–1.0)
Monocytes Relative: 11 %
Neutro Abs: 7.6 10*3/uL (ref 1.7–7.7)
Neutrophils Relative %: 72 %
Platelets: 426 10*3/uL — ABNORMAL HIGH (ref 150–400)
RBC: 3.56 MIL/uL — ABNORMAL LOW (ref 4.22–5.81)
RDW: 12.1 % (ref 11.5–15.5)
WBC: 10.5 10*3/uL (ref 4.0–10.5)
nRBC: 0 % (ref 0.0–0.2)

## 2019-07-11 MED ORDER — GADOBUTROL 1 MMOL/ML IV SOLN
7.0000 mL | Freq: Once | INTRAVENOUS | Status: AC | PRN
  Administered 2019-07-11: 7 mL via INTRAVENOUS

## 2019-07-11 NOTE — Assessment & Plan Note (Signed)
Bilateral lower extremity weakness and neuropathy, progressive since March, given what we know now about his PET scan, thoracic mets very concerning for possible cord involvement.  I will speak to Dr. Tomi Likens.  He has not had his MRI cervical and thoracic spine yet.  We need to facilitate this is soon as possible, the patient may need to be admitted in order to get these things done expediently.

## 2019-07-11 NOTE — Assessment & Plan Note (Signed)
The mass is hypermetabolic and there are apparently osseous mets, possibly an adrenal met although I do not see a good target for biopsy.  Interventional radiology should be able to help decide whether the left upper lobe mass or the adrenal is a better target.  He is interested in having a biopsy done to initiate therapy if possible. I believe TTNA of the LUL mass makes the most sense.

## 2019-07-11 NOTE — Patient Instructions (Addendum)
Dr. Lamonte Sakai and Dr. Tomi Likens have discussed your symptoms, PET scan. You will need an MRI of the cervical and lumbar spine as soon as possible.  We also need to facilitate assistance from interventional radiology to arrange for biopsy of your left lung mass. Because spinal injury from tumor is a possible medical emergency we believe that you need to be admitted to the hospital soon as possible.  You need to go to the [facility] emergency department ASAP for admission to facilitate care.  Dr. Lamonte Sakai will consult with the ED, with internal medicine at [facility] to help arrange admission.

## 2019-07-11 NOTE — Progress Notes (Signed)
Ronald Gutierrez "Ray" Male, 86 y.o., 05-May-1926 MRN:  [id number] Phone:  [phone] (H) PCP:  Eulas Post, MD Coverage:  [place] [employer] Medicare Hmo/Ppo Next Appt With Neurology 11/04/2019 at 10:10 AM  RE: CT Biopsy Received: Today Arne Cleveland, MD  [place]   CT core LUL mass    [facility]       Previous Messages   ----- Message -----  From: Garth Bigness D  Sent: 07/11/2019  4:21 PM EDT  To: Ir Procedure Requests  Subject: CT Biopsy                     Procedure: CT Biopsy   Reason:  Mass of left lung, left upper lobe lung mass   History:  CT, NM PET, MRI's in computer   Provider: Collene Gobble   Provider Contact: [phone]

## 2019-07-11 NOTE — ED Provider Notes (Signed)
[facility] DEPT Provider Note   CSN: [id number] Arrival date & time: 07/11/19  1102     History Chief Complaint  Patient presents with  . abnormal PET scan    Ronald Gutierrez is a 86 y.o. male.  HPI      86 year old male with history of hypertension, hypothyroidism, colon cancer, Guillain-Barr syndrome, neuropathy and lower extremity weakness for which she was having an evaluation by Dr. Tomi Likens of neurology, recent finding of hypermetabolic lung mass with apparent osseous metastases undergoing evaluation presents with concern for progressive weakness of the lower extremities, with desire for emergent MRI recommended by Dr. Lamonte Sakai Pulmonology.   Has had chronic dyspnea and cough for what he was evaluated in ED 6/16 with diagnosis of mass, treatment for bronchitis. No significant change since that time. No fevers. Reports progressive LE weakness. Has required walker to get around for the last few weeks and over the last 2 weeks has had difficulty standing on his own.  Wife has had to help him get up from commode. No loss of control of bowel or bladder. Has bilateral LE numbness of feet reports secondary to neuropathy.  Past Medical History:  Diagnosis Date  . ANEMIA   . Colon adenocarcinoma ([place]) 05/2008   T4, N0  . Diverticulitis   . FATIGUE 05/13/2009  . GERD 05/25/2008  . Guillain-Barre syndrome ([place])   . HYPERLIPIDEMIA 05/20/2008  . HYPERTENSION 05/20/2008  . Hypothyroidism   . KERATOSIS 10/21/2008  . Tubular adenoma of colon 10/2009    Patient Active Problem List   Diagnosis Date Noted  . Mass of left lung 07/11/2019  . Gait abnormality 06/24/2019  . Paresthesia 06/24/2019  . History of Guillain-Barre syndrome 12/03/2013  . History of malignant neoplasm of large intestine 09/22/2009  . KERATOSIS 10/21/2008  . 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 GERD 05/25/2008  . PERSONAL HX COLONIC POLYPS 05/25/2008  . Hypothyroidism 05/20/2008   . Hyperlipidemia 05/20/2008  . ANEMIA 05/20/2008  . Essential hypertension 05/20/2008    Past Surgical History:  Procedure Laterality Date  . APPENDECTOMY  1948  . CATARACT EXTRACTION    . CERVICAL LAMINECTOMY  1968  . COLECTOMY  07/2008   transverse  . HEMORRHOID SURGERY    . HERNIA REPAIR     inguinal  . LUMBAR LAMINECTOMY  1970  . OMENTECTOMY         Family History  Problem Relation Age of Onset  . Lung cancer Brother   . Heart disease Brother   . Prostate cancer Brother   . Stroke Father     Social History   Tobacco Use  . Smoking status: Former Smoker    Packs/day: 1.50    Years: 15.00    Pack years: 22.50    Types: Cigarettes    Quit date: 01/17/1968    Years since quitting: 51.5  . Smokeless tobacco: Never Used  . Tobacco comment: aged out of preventive screens  Vaping Use  . Vaping Use: Never used  Substance Use Topics  . Alcohol use: Yes  . Drug use: No    Home Medications Prior to Admission medications   Medication Sig Start Date End Date Taking? Authorizing Provider  aspirin 81 MG tablet Take 81 mg by mouth daily.    [provider]  azelastine (ASTELIN) 0.1 % nasal spray SPRAY ONE SPRAY IN EACH NOSTRIL TWICE DAILY AS DIRECTED Patient not taking: No sig reported 11/13/18   Eulas Post, MD  azithromycin (ZITHROMAX) 250 MG tablet Take 1 tablet (250 mg total) by mouth daily. 07/02/19   Veryl Speak, MD  fexofenadine (ALLEGRA) 180 MG tablet Take 180 mg by mouth daily.    [provider]  levothyroxine (SYNTHROID) 75 MCG tablet Take 1 tablet (75 mcg total) by mouth daily. 12/25/18   Philemon Kingdom, MD  lisinopril (ZESTRIL) 20 MG tablet TAKE ONE TABLET BY MOUTH DAILY 06/24/19   Burchette, Alinda Sierras, MD  Multiple Vitamins-Minerals (CENTRUM ULTRA MENS PO) Take 1 tablet by mouth daily.      [provider]  omeprazole (PRILOSEC) 20 MG capsule TAKE ONE CAPSULE BY MOUTH DAILY 06/09/19   Burchette, Alinda Sierras, MD  simvastatin  (ZOCOR) 20 MG tablet TAKE ONE TABLET BY MOUTH AT BEDTIME 06/27/19   Burchette, Alinda Sierras, MD  triamcinolone (NASACORT ALLERGY 24HR) 55 MCG/ACT AERO nasal inhaler Place 2 sprays into the nose daily as needed (congestion).     [provider]    Allergies    Ferrous sulfate  Review of Systems   Review of Systems  Constitutional: Positive for fatigue. Negative for fever.  Respiratory: Positive for cough and shortness of breath.   Cardiovascular: Negative for chest pain.  Gastrointestinal: Negative for abdominal pain, nausea and vomiting.  Musculoskeletal: Positive for back pain.  Skin: Negative for rash.  Neurological: Positive for weakness and numbness.    Physical Exam Updated Vital Signs BP (!) 153/86 (BP Location: Left Arm)   Pulse 78   Temp 97.8 F (36.6 C) (Oral)   Resp 16   Ht 5\' 8"  (1.727 m)   Wt 77.6 kg   SpO2 96%   BMI 26.00 kg/m   Physical Exam Vitals and nursing note reviewed.  Constitutional:      General: He is not in acute distress.    Appearance: Normal appearance. He is not ill-appearing, toxic-appearing or diaphoretic.  HENT:     Head: Normocephalic.  Eyes:     Conjunctiva/sclera: Conjunctivae normal.  Cardiovascular:     Rate and Rhythm: Normal rate and regular rhythm.     Pulses: Normal pulses.  Pulmonary:     Effort: Pulmonary effort is normal. No respiratory distress.  Musculoskeletal:        General: No deformity or signs of injury.     Cervical back: No rigidity.  Skin:    General: Skin is warm and dry.     Coloration: Skin is not jaundiced or pale.  Neurological:     General: No focal deficit present.     Mental Status: He is alert and oriented to person, place, and time.     Comments: Generally weak 4-5- bilateral lower extremities Numbness bilateral feet      ED Results / Procedures / Treatments   Labs (all labs ordered are listed, but only abnormal results are displayed) Labs Reviewed  CBC WITH DIFFERENTIAL/PLATELET -  Abnormal; Notable for the following components:      Result Value   RBC 3.56 (*)    Hemoglobin 12.0 (*)    HCT 35.3 (*)    Platelets 426 (*)    Monocytes Absolute 1.2 (*)    Abs Immature Granulocytes 0.08 (*)    All other components within normal limits  COMPREHENSIVE METABOLIC PANEL - Abnormal; Notable for the following components:   Sodium 128 (*)    Chloride 95 (*)    Glucose, Bld 108 (*)    Calcium 8.8 (*)    Total Protein 8.2 (*)  Albumin 3.4 (*)    AST 71 (*)    ALT 77 (*)    All other components within normal limits    EKG None  Radiology MR Cervical Spine W or Wo Contrast  Result Date: 07/11/2019 CLINICAL DATA:  Bilateral lower extremity weakness since March. History of colon cancer. EXAM: MRI CERVICAL SPINE WITHOUT AND WITH CONTRAST TECHNIQUE: Multiplanar and multiecho pulse sequences of the cervical spine, to include the craniocervical junction and cervicothoracic junction, were obtained without and with intravenous contrast. CONTRAST:  7mL GADAVIST GADOBUTROL 1 MMOL/ML IV SOLN COMPARISON:  PET-CT 07/10/2019 FINDINGS: Alignment: Cervical spine straightening. Grade 1 anterolisthesis of C7 on T1 and T1 on T2. Vertebrae: Remote, solid C4-C7 interbody fusion. No fracture, suspicious osseous lesion, or significant marrow edema. Cord: Mildly atrophic appearance of the cervical spinal cord diffusely. No focal signal abnormality or abnormal enhancement. Posterior Fossa, vertebral arteries, paraspinal tissues: Unremarkable. Disc levels: C2-3: Disc bulging, left uncovertebral spurring, and mild right and severe left facet arthrosis result in moderate left neural foraminal stenosis without spinal stenosis. C3-4: Broad-based posterior disc osteophyte complex and severe left facet arthrosis result in mild right and moderate left neural foraminal stenosis without spinal stenosis. C4-5: Anterior fusion.  No stenosis. C5-6: Anterior fusion.  No stenosis. C6-7: Anterior fusion.  No stenosis.  C7-T1: Anterolisthesis with disc uncovering, uncovertebral spurring, and moderate facet arthrosis result in at most mild bilateral neural foraminal stenosis without spinal stenosis. IMPRESSION: 1. No evidence of metastatic disease in the cervical spine. 2. Remote C4-C7 interbody fusion without stenosis. 3. Disc and advanced facet degeneration with moderate left neural foraminal stenosis at C2-3 and C3-4. 4. No spinal stenosis. 5. Mild diffuse spinal cord atrophy without focal cord abnormality. Electronically Signed   By: Logan Bores M.D.   On: 07/11/2019 14:20   MR THORACIC SPINE W WO CONTRAST  Result Date: 07/11/2019 CLINICAL DATA:  Bilateral lower extremity weakness since March. History of colon cancer. EXAM: MRI THORACIC WITHOUT AND WITH CONTRAST TECHNIQUE: Multiplanar and multiecho pulse sequences of the thoracic spine were obtained without and with intravenous contrast. CONTRAST:  19mL GADAVIST GADOBUTROL 1 MMOL/ML IV SOLN COMPARISON:  PET-CT 07/10/2019.  Chest CT 07/02/2019. FINDINGS: Alignment: Grade 1 anterolisthesis of C7 on T1 and T1 on T2. Vertebrae: No fracture. 1.7 cm T2 hyperintense lesion anteriorly in the T12 vertebral body with only low level enhancement corresponding to an FDG avid lesion on PET-CT. Similar mildly enhancing subcentimeter lesions in the base of the T11 spinous process and left T9 transverse process. Subcentimeter lesion versus degenerative edema in the right T4 inferior articular process. No evidence of epidural tumor. Cord: Mildly atrophic appearance of the thoracic spinal cord diffusely. No focal signal abnormality or abnormal enhancement. Paraspinal and other soft tissues: Lung and mediastinal findings better evaluated on PET-CT. Disc levels: At T1-2, anterolisthesis with disc uncovering and facet and ligamentum flavum hypertrophy result in borderline to mild spinal stenosis. Small left paracentral disc protrusions from T2-3 to T4-5, small central disc protrusions at T5-6 and  T6-7, and minimal lower thoracic disc bulging do not result in significant stenosis or spinal cord mass effect. IMPRESSION: 1. Multiple osseous lesions in the thoracic spine consistent with metastases, largest at T12. 2. No evidence of epidural tumor. 3. Mild thoracic spondylosis without significant stenosis. 4. Mild diffuse spinal cord atrophy without focal abnormality. Electronically Signed   By: Logan Bores M.D.   On: 07/11/2019 14:31   MR Lumbar Spine W Wo Contrast  Result Date: 07/11/2019  CLINICAL DATA:  Bilateral lower extremity weakness since March. History of colon cancer. EXAM: MRI LUMBAR SPINE WITHOUT AND WITH CONTRAST TECHNIQUE: Multiplanar and multiecho pulse sequences of the lumbar spine were obtained without and with intravenous contrast. CONTRAST:  11mL GADAVIST GADOBUTROL 1 MMOL/ML IV SOLN COMPARISON:  PET-CT 07/10/2019. FINDINGS: Segmentation:  Standard. Alignment: Mild S-shaped lumbar scoliosis, convex left in the lower lumbar spine. Mild straightening of the normal lumbar lordosis. Trace retrolisthesis of L3 on L4 and L4 on L5. Vertebrae: No fracture. Multiple T1 hypointense, T2 hyperintense, variably enhancing lesions throughout the lumbar spine with the largest measuring 1.2 cm in the superior L1 vertebral body and 1.5 cm in the right inferolateral L2 vertebral body. Small lesions in the lumbar posterior elements, sacrum, and right ilium. No evidence of epidural tumor. Conus medullaris and cauda equina: Conus extends to the L1 level and appears normal. A few small foci of signal hyperintensity in the anterior aspect of the spinal canal at L2-3 on axial precontrast and postcontrast T1 images may be artifactual as they are not evident on sagittal images, nor is there a corresponding abnormality on T2 weighted sequences. No convincing cauda equina nerve root enhancement or thickening is identified to suggest leptomeningeal metastatic disease. Paraspinal and other soft tissues: Unremarkable.  Disc levels: Disc desiccation throughout the lumbar spine with severe disc space narrowing from L2-3 to L5-S1. T12-L1: Mild facet hypertrophy without disc herniation or stenosis. L1-2: Disc bulging and moderate facet and ligamentum flavum hypertrophy result in mild spinal stenosis, mild bilateral lateral recess stenosis, and borderline to mild left neural foraminal stenosis. L2-3: Circumferential disc osteophyte complex and moderate to severe facet and ligamentum flavum hypertrophy result in moderate spinal stenosis, severe bilateral lateral recess stenosis, and mild left neural foraminal stenosis. Potential bilateral L3 nerve root impingement. L3-4: Possible right laminotomy. Circumferential disc bulging eccentric to the right and moderate facet and ligamentum flavum hypertrophy result in mild-to-moderate spinal stenosis, severe bilateral lateral recess stenosis, and moderate right and mild left neural foraminal stenosis. Potential bilateral L4 nerve root impingement. L4-5: Right laminotomy. Circumferential disc osteophyte complex and moderate facet hypertrophy result in moderate right and severe left lateral recess stenosis and mild bilateral neural foraminal stenosis without significant spinal stenosis. Potential left L5 nerve root impingement. L5-S1: Right laminotomy. Small partially calcified central disc protrusion, disc bulging, and moderate left greater than right facet and residual ligamentum flavum hypertrophy result in moderate bilateral lateral recess stenosis and moderate right and severe left neural foraminal stenosis with potential L5 and S1 nerve root impingement. No spinal stenosis. IMPRESSION: 1. Multiple osseous metastases throughout the lumbar spine and pelvis. No evidence of epidural tumor. 2. Advanced lumbar disc degeneration with moderate to severe multilevel lateral recess and neural foraminal stenosis as above. Electronically Signed   By: Logan Bores M.D.   On: 07/11/2019 14:48   NM PET  Image Initial (PI) Skull Base To Thigh  Result Date: 07/10/2019 CLINICAL DATA:  Initial treatment strategy for non-small-cell lung cancer. Lung mass. History of colon cancer. Hernia repair. EXAM: NUCLEAR MEDICINE PET SKULL BASE TO THIGH TECHNIQUE: 9.3 mCi F-18 FDG was injected intravenously. Full-ring PET imaging was performed from the skull base to thigh after the radiotracer. CT data was obtained and used for attenuation correction and anatomic localization. Fasting blood glucose: 100 mg/dl COMPARISON:  Chest CT 07/02/2019 FINDINGS: Mediastinal blood pool activity: SUV max 2.7 Liver activity: SUV max NA NECK: No areas of abnormal hypermetabolism. Incidental CT findings: No cervical adenopathy. CHEST: Hypermetabolic  left upper lobe lung mass, including at 6.8 x 3.9 cm and a S.U.V. max of 20.3. Mediastinal nodal metastasis, including an AP window node of 2.2 cm and a S.U.V. max of 19.2 on 51/4. Left suprahilar and hilar nodal metastasis, including at a S.U.V. max of 12.7 on 59/4. Multifocal left-sided pleural metastasis, including at a S.U.V. max of 11.7 on approximately 83/4. Incidental CT findings: Deferred to recent diagnostic CT. Cardiomegaly. Aortic and coronary artery atherosclerosis. Centrilobular emphysema and interstitial lung disease, most consistent with usual interstitial pneumonitis. Moderate hiatal hernia. ABDOMEN/PELVIS: Heterogeneous hypermetabolism involves the hepatic dome. Examples within segment 2 or 4A at a S.U.V. max of 5.4 on approximately image 82/4 and within segments 7 and 8 at a S.U.V. max of 5.4 on approximately image 82/4. No well-defined correlate on today's CT, or the nondedicated chest CT a of 07/02/2019. Left adrenal hypermetabolism without dominant nodule or mass. Example at a S.U.V. max of 9.3. Multifocal right-sided colonic hypermetabolism is favored to be physiologic. Left-sided anterior prostatic hypermetabolism measures a S.U.V. max of 5.9, including on 171/4. Incidental CT  findings: Extensive colonic diverticulosis. Aortic atherosclerosis. Fat and nonobstructive small bowel containing ventral abdominal wall hernia. Moderate prostatomegaly. SKELETON: Multifocal osseous hypermetabolism consistent with metastatic disease. Relatively CT occult. Examples within the left acetabulum at a S.U.V. max of 11.6 on 167/4, the left pubic bone at a S.U.V. max of 9.0 on 171/4, L1 vertebral body at a S.U.V. max of 10.1 and upper sacrum and a S.U.V. max of 15.1. Incidental CT findings: Osteoarthritis of both hips. No rib destruction at the site of left upper lobe presumed primary. IMPRESSION: 1. Left upper lobe primary bronchogenic carcinoma with left pleural, thoracic nodal, left adrenal, and osseous metastasis. 2. Heterogeneous hypermetabolism within the hepatic dome. Indeterminate. Cannot exclude subtle early hepatic metastasis. If more definitive characterization is desired, dedicated CT or MRI could be performed. 3. Prostatomegaly with nonspecific hypermetabolism within the anterior left gland. Of doubtful clinical significance given age and comorbidities. 4. Incidental findings, including: Hiatal hernia. Coronary artery atherosclerosis. Aortic Atherosclerosis (ICD10-I70.0). Emphysema (ICD10-J43.9). Electronically Signed   By: Abigail Miyamoto M.D.   On: 07/10/2019 15:59    Procedures Procedures (including critical care time)  Medications Ordered in ED Medications  gadobutrol (GADAVIST) 1 MMOL/ML injection 7 mL (7 mLs Intravenous Contrast Given 07/11/19 1411)    ED Course  I have reviewed the triage vital signs and the nursing notes.  Pertinent labs & imaging results that were available during my care of the patient were reviewed by me and considered in my medical decision making (see chart for details).    MDM Rules/Calculators/A&P                          86 year old male with history of hypertension, hypothyroidism, colon cancer, Guillain-Barr syndrome, neuropathy and lower  extremity weakness for which she was having an evaluation by Dr. Tomi Likens of neurology, recent finding of hypermetabolic lung mass with apparent osseous metastases undergoing evaluation presents with concern for progressive weakness of the lower extremities, with desire for emergent MRI recommended by Dr. Lamonte Sakai Pulmonology.  Labs show worsening hypokalemia, down to 128 from 130 last week. Recommend continued close outpatient evaluation. Appears euvolemic and possible hypokalemia related to underlying pulmonary malignancy, possible dehydration. Do not see indication given slow decrease, mild decrease from last week.  MRI performed of C/T/L spine given suspected cancer hx and weakness and shows no evidence of epidural tumor or other acute  abnormalities.   Recommend close outpatient follow up, PT/OT/home health. Dr. Lamonte Sakai to set up outpt IR biopsy.  Final Clinical Impression(s) / ED Diagnoses Final diagnoses:  Hyponatremia  Malignant neoplasm metastatic to bone [facility])  Lung mass  Generalized weakness    Rx / DC Orders ED Discharge Orders    None       Gareth Morgan, MD 07/11/19 2243

## 2019-07-11 NOTE — Progress Notes (Signed)
Subjective:    Patient ID: Ronald Gutierrez, male    DOB: 07/07/1926, 86 y.o.   MRN: [id number]  HPI 86 year old former smoker (23 pack years) with a history of Guillain-Barr, colon cancer (resected 2010), GERD, hypothyroidism, hypertension on lisinopril, anemia.  Pt began to experience B LE neuropathic changes with paresthesias, weakness in February. Has progressively worsened. Less able to ambulate. EMG done 6/8 showed non-specific neuropathy, no recurrence GB.   He was seen in the emergency department on 07/02/2019 with 3 days of shortness of breath, cough with some mucus production.  Chest x-ray showed a peripheral left lung opacity, concerning for mass.  This about a CT chest 07/02/2019 which I have reviewed and which shows a bilobed pleural-based left upper lobe mass 6.3 x 3.6 x 5.9 cm with some satellite nodules extending back towards the left hilum.  PET scan done on 6/24 reviewed by me, shows hypermetabolism of the left upper lobe mass, left suprahilar and hilar nodal hypermetabolism, AP window nodal hypermetabolism, multiple left-sided pleural metastases.  Also noted was left adrenal hypermetabolism without any clear dominant nodule.  Multifocal right-sided colonic hypermetabolism and prostatic hypermetabolism of unclear clinical significance.  Multifocal osseous hypermetabolism, left acetabulum, left pubic bone, L1 vertebral body, upper sacrum.    Review of Systems As per HPI  Past Medical History:  Diagnosis Date  . ANEMIA   . Colon adenocarcinoma ([place]) 05/2008   T4, N0  . Diverticulitis   . FATIGUE 05/13/2009  . GERD 05/25/2008  . Guillain-Barre syndrome ([place])   . HYPERLIPIDEMIA 05/20/2008  . HYPERTENSION 05/20/2008  . Hypothyroidism   . KERATOSIS 10/21/2008  . Tubular adenoma of colon 10/2009     Family History  Problem Relation Age of Onset  . Lung cancer Brother   . Heart disease Brother   . Prostate cancer Brother   . Stroke Father      Social History   Socioeconomic  History  . Marital status: Married    Spouse name: Not on file  . Number of children: Not on file  . Years of education: Not on file  . Highest education level: Not on file  Occupational History  . Occupation: Retired    Fish farm manager: RETIRED  Tobacco Use  . Smoking status: Former Smoker    Packs/day: 1.50    Years: 15.00    Pack years: 22.50    Types: Cigarettes    Quit date: 01/17/1968    Years since quitting: 51.5  . Smokeless tobacco: Never Used  . Tobacco comment: aged out of preventive screens  Vaping Use  . Vaping Use: Never used  Substance and Sexual Activity  . Alcohol use: Yes  . Drug use: No  . Sexual activity: Not on file  Other Topics Concern  . Not on file  Social History Narrative   Right handed   Lives with wife in one story home   Social Determinants of Health   Financial Resource Strain:   . Difficulty of Paying Living Expenses:   Food Insecurity:   . Worried About Charity fundraiser in the Last Year:   . Arboriculturist in the Last Year:   Transportation Needs:   . Film/video editor (Medical):   Marland Kitchen Lack of Transportation (Non-Medical):   Physical Activity:   . Days of Exercise per Week:   . Minutes of Exercise per Session:   Stress:   . Feeling of Stress :   Social Connections:   . Frequency of  Communication with Friends and Family:   . Frequency of Social Gatherings with Friends and Family:   . Attends Religious Services:   . Active Member of Clubs or Organizations:   . Attends Archivist Meetings:   Marland Kitchen Marital Status:   Intimate Partner Violence:   . Fear of Current or Ex-Partner:   . Emotionally Abused:   Marland Kitchen Physically Abused:   . Sexually Abused:      Allergies  Allergen Reactions  . Ferrous Sulfate     REACTION: Stomach upset     Outpatient Medications Prior to Visit  Medication Sig Dispense Refill  . aspirin 81 MG tablet Take 81 mg by mouth daily.    Marland Kitchen azelastine (ASTELIN) 0.1 % nasal spray SPRAY ONE SPRAY IN EACH  NOSTRIL TWICE DAILY AS DIRECTED (Patient not taking: No sig reported) 30 mL 1  . azithromycin (ZITHROMAX) 250 MG tablet Take 1 tablet (250 mg total) by mouth daily. 4 tablet 0  . fexofenadine (ALLEGRA) 180 MG tablet Take 180 mg by mouth daily.    Marland Kitchen levothyroxine (SYNTHROID) 75 MCG tablet Take 1 tablet (75 mcg total) by mouth daily. 90 tablet 3  . lisinopril (ZESTRIL) 20 MG tablet TAKE ONE TABLET BY MOUTH DAILY 90 tablet 2  . Multiple Vitamins-Minerals (CENTRUM ULTRA MENS PO) Take 1 tablet by mouth daily.      Marland Kitchen omeprazole (PRILOSEC) 20 MG capsule TAKE ONE CAPSULE BY MOUTH DAILY 90 capsule 2  . simvastatin (ZOCOR) 20 MG tablet TAKE ONE TABLET BY MOUTH AT BEDTIME 90 tablet 0  . triamcinolone (NASACORT ALLERGY 24HR) 55 MCG/ACT AERO nasal inhaler Place 2 sprays into the nose daily as needed (congestion).      No facility-administered medications prior to visit.         Objective:   Physical Exam Vitals:   07/11/19 0835  BP: 140/82  Pulse: 77  Temp: 98.2 F (36.8 C)  TempSrc: Oral  SpO2: 96%  Weight: 171 lb (77.6 kg)  Height: '5\' 8"'$  (1.727 m)   Gen: Pleasant, elderly man in wheelchair, in no distress,  normal affect  ENT: No lesions,  mouth clear,  oropharynx clear, no postnasal drip  Neck: No JVD, no stridor  Lungs: No use of accessory muscles, decreased at bases,   Cardiovascular: RRR, heart sounds normal, no murmur or gallops, no peripheral edema  Musculoskeletal: No deformities, no cyanosis or clubbing  Neuro: alert, awake, non focal  Skin: Warm, no lesions or rash      Assessment & Plan:  Paresthesia Bilateral lower extremity weakness and neuropathy, progressive since March, given what we know now about his PET scan, thoracic mets very concerning for possible cord involvement.  I will speak to Dr. Tomi Likens.  He has not had his MRI cervical and thoracic spine yet.  We need to facilitate this is soon as possible, the patient may need to be admitted in order to get these  things done expediently.  Mass of left lung The mass is hypermetabolic and there are apparently osseous mets, possibly an adrenal met although I do not see a good target for biopsy.  Interventional radiology should be able to help decide whether the left upper lobe mass or the adrenal is a better target.  He is interested in having a biopsy done to initiate therapy if possible. I believe TTNA of the LUL mass makes the most sense.   Baltazar Apo, MD, PhD 07/11/2019, 9:12 AM [facility] [phone] or if  no answer [phone]

## 2019-07-11 NOTE — ED Triage Notes (Signed)
Patient had a PET scan yesterday which showed a thoracic abnormality. Patient was sent to the ED for a CT scan or MRI of the area.

## 2019-07-11 NOTE — Addendum Note (Signed)
Addended by: Jannette Spanner on: 07/11/2019 03:10 PM   Modules accepted: Orders

## 2019-07-14 ENCOUNTER — Encounter: Payer: Self-pay | Admitting: Family Medicine

## 2019-07-14 DIAGNOSIS — E871 Hypo-osmolality and hyponatremia: Secondary | ICD-10-CM

## 2019-07-14 NOTE — Telephone Encounter (Signed)
Spoke with wife at some length.  Recent drop in sodium from 130 to 128.  No thiazide use.  Probably related to decreased intake and may have SIADH issue going on secondary to lung involvement with cancer.  Put in future order for CMP and have them schedule for Wednesday of this week.

## 2019-07-15 ENCOUNTER — Telehealth: Payer: Self-pay | Admitting: Family Medicine

## 2019-07-15 NOTE — Telephone Encounter (Signed)
Vernell Leep calling to return Ronald Gutierrez's call.

## 2019-07-15 NOTE — Telephone Encounter (Signed)
Per pt advise in note pt needs lab appt scheduled for Wednesday please schedule

## 2019-07-15 NOTE — Telephone Encounter (Signed)
Made appt for pt for Wednesday.

## 2019-07-15 NOTE — Telephone Encounter (Signed)
Lvm for pt to call back to schedule lab appt for tomorrow

## 2019-07-16 ENCOUNTER — Other Ambulatory Visit: Payer: Self-pay

## 2019-07-16 ENCOUNTER — Other Ambulatory Visit (INDEPENDENT_AMBULATORY_CARE_PROVIDER_SITE_OTHER): Payer: Medicare HMO

## 2019-07-16 DIAGNOSIS — E871 Hypo-osmolality and hyponatremia: Secondary | ICD-10-CM

## 2019-07-16 LAB — COMPREHENSIVE METABOLIC PANEL
ALT: 48 U/L (ref 0–53)
AST: 35 U/L (ref 0–37)
Albumin: 3.9 g/dL (ref 3.5–5.2)
Alkaline Phosphatase: 111 U/L (ref 39–117)
BUN: 18 mg/dL (ref 6–23)
CO2: 26 mEq/L (ref 19–32)
Calcium: 9.3 mg/dL (ref 8.4–10.5)
Chloride: 91 mEq/L — ABNORMAL LOW (ref 96–112)
Creatinine, Ser: 0.95 mg/dL (ref 0.40–1.50)
GFR: 74.32 mL/min (ref >60.00)
Glucose, Bld: 84 mg/dL (ref 70–99)
Potassium: 4.2 mEq/L (ref 3.5–5.1)
Sodium: 125 mEq/L — ABNORMAL LOW (ref 135–145)
Total Bilirubin: 0.4 mg/dL (ref 0.2–1.2)
Total Protein: 7.6 g/dL (ref 6.0–8.3)

## 2019-07-17 NOTE — Addendum Note (Signed)
Addended by: Eulas Post on: 07/17/2019 12:32 PM   Modules accepted: Orders

## 2019-07-18 ENCOUNTER — Other Ambulatory Visit: Payer: Self-pay

## 2019-07-18 ENCOUNTER — Other Ambulatory Visit: Payer: Self-pay | Admitting: Student

## 2019-07-18 ENCOUNTER — Other Ambulatory Visit: Payer: Medicare HMO

## 2019-07-18 ENCOUNTER — Other Ambulatory Visit (INDEPENDENT_AMBULATORY_CARE_PROVIDER_SITE_OTHER): Payer: Medicare HMO

## 2019-07-18 DIAGNOSIS — E871 Hypo-osmolality and hyponatremia: Secondary | ICD-10-CM | POA: Diagnosis not present

## 2019-07-18 LAB — BASIC METABOLIC PANEL
BUN: 18 mg/dL (ref 6–23)
CO2: 20 mEq/L (ref 19–32)
Calcium: 9.3 mg/dL (ref 8.4–10.5)
Chloride: 95 mEq/L — ABNORMAL LOW (ref 96–112)
Creatinine, Ser: 0.86 mg/dL (ref 0.40–1.50)
GFR: 83.36 mL/min (ref >60.00)
Glucose, Bld: 141 mg/dL — ABNORMAL HIGH (ref 70–99)
Potassium: 4.3 mEq/L (ref 3.5–5.1)
Sodium: 127 mEq/L — ABNORMAL LOW (ref 135–145)

## 2019-07-19 ENCOUNTER — Ambulatory Visit (HOSPITAL_COMMUNITY)
Admission: RE | Admit: 2019-07-19 | Discharge: 2019-07-19 | Disposition: A | Discharge status: Home / Self Care | Payer: Medicare HMO | Source: Ambulatory Visit | Attending: Emergency Medicine | Admitting: Emergency Medicine

## 2019-07-19 DIAGNOSIS — Z01812 Encounter for preprocedural laboratory examination: Secondary | ICD-10-CM | POA: Insufficient documentation

## 2019-07-19 DIAGNOSIS — Z20822 Contact with and (suspected) exposure to covid-19: Secondary | ICD-10-CM | POA: Diagnosis not present

## 2019-07-19 LAB — SARS CORONAVIRUS 2 (TAT 6-24 HRS): SARS Coronavirus 2: NEGATIVE

## 2019-07-21 NOTE — Consult Note (Signed)
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 remodeling versus mild residual findings from previous history of demyelinating neuropathy. 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 ------------------------------- Marcial Pacas, M.D. PhD [facility] [address], [place], [place] [address] Tel: [phone] Fax: [phone] Verbal informed consent was obtained from the patient, patient was informed of potential risk of procedure, including bruising, bleeding, hematoma formation, infection, muscle weakness, muscle pain, numbness, among others.     [place]   Nerve / Sites Muscle Latency Ref. Amplitude Ref. Rel Amp Segments Distance Velocity Ref. Area   ms ms mV mV %  cm m/s m/s mVms L Median - APB    Wrist APB 5.1 ?4.4 4.8 ?4.0 100 Wrist - APB 7   18.4    Upper arm APB 9.6  4.7  97.2 Upper arm - Wrist 22 49 ?49 18.7 R Median - APB    Wrist APB 4.3 ?4.4 2.9 ?4.0 100 Wrist - APB 7   12.1    Upper arm APB 9.0  4.5  154 Upper arm - Wrist 23 49 ?49 16.5    Ulnar Wrist APB 3.5  2.5  55.1 Ulnar Wrist - APB    6.7    Ulnar B. Elbow APB 8.4  0.7  29.7 Ulnar B. Elbow - APB    2.9    Ulnar A. Elbow  APB 10.9  1.0  137 Ulnar A. Elbow - Ulnar B. Elbow    3.2 L Ulnar - ADM    Wrist ADM 3.1 ?3.3 7.3 ?6.0 100 Wrist - ADM 7   26.8    B.Elbow ADM 7.2  5.9  81.5 B.Elbow - Wrist 21 51 ?49 24.3    A.Elbow ADM 9.9  5.2  87.6 A.Elbow - B.Elbow 10 36 ?49 20.7 R Ulnar - ADM    Wrist ADM 3.1 ?3.3 6.2 ?6.0 100 Wrist - ADM 7   22.4    B.Elbow ADM 7.5  3.1  51.1 B.Elbow - Wrist 22 50 ?49 11.2    A.Elbow ADM 9.6  2.8  90.4 A.Elbow - B.Elbow 10 49 ?49 12.8 L Peroneal - EDB    Ankle EDB 5.8 ?6.5 1.3 ?2.0 100 Ankle -  EDB 9   6.7    Fib head EDB 12.1  1.2  95.1 Fib head - Ankle 28 44 ?44 5.7    Pop fossa EDB 15.0  1.1  94.4 Pop fossa - Fib head 10 34 ?44 5.7        Pop fossa - Ankle     R Peroneal - EDB    Ankle EDB 4.6 ?6.5 0.4 ?2.0 100 Ankle - EDB 9   2.3    Fib head EDB 12.5  0.3  87.8 Fib head - Ankle 28 35 ?44 1.9    Pop fossa EDB 15.3  0.3  95.5 Pop fossa - Fib head 10 36 ?44 1.9        Pop fossa - Ankle     L Tibial - AH    Ankle AH 4.7 ?5.8 1.6 ?4.0 100 Ankle - AH 9   4.4    Pop fossa AH 13.6  1.3  80.7 Pop fossa - Ankle 37 41 ?41 4.8 R Tibial - AH    Ankle AH 4.5 ?5.8 2.8 ?4.0 100 Ankle - AH 9   8.3    Pop fossa AH 16.1  1.7  58.8 Pop fossa - Ankle 38 33 ?41 5.8                   SNC   Nerve / Sites Rec. Site Peak Lat Ref.  Amp Ref. Segments Distance   ms ms V V  cm L Radial - Anatomical snuff box (Forearm)    Forearm Wrist 2.6 ?2.9 9 ?15 Forearm - Wrist 10 R Radial - Anatomical snuff box (Forearm)    Forearm Wrist 2.6 ?2.9 17 ?15 Forearm - Wrist 10 L Sural - Ankle (Calf)    Calf Ankle NR ?4.4 NR ?6 Calf - Ankle 14 R Sural - Ankle (Calf)    Calf Ankle NR ?4.4 NR ?6 Calf - Ankle 14 L Superficial peroneal - Ankle    Lat leg Ankle NR ?4.4 NR ?6 Lat leg - Ankle 14 R Superficial peroneal - Ankle    Lat leg Ankle NR ?4.4 NR ?6 Lat leg - Ankle 14 L Median - Orthodromic (Dig II, Mid palm)    Dig II Wrist 5.5 ?3.4 3 ?10 Dig II - Wrist 13 R Median - Orthodromic (Dig II, Mid palm)    Dig II Wrist 4.5 ?3.4 4 ?10 Dig II - Wrist 13 L  Ulnar - Orthodromic, (Dig V, Mid palm)    Dig V Wrist NR ?3.1 NR ?5 Dig V - Wrist 11 R Ulnar - Orthodromic, (Dig V, Mid palm)    Dig V Wrist NR ?3.1 NR ?5 Dig V - Wrist 54                       F  Wave   Nerve F Lat Ref.  ms ms L Tibial - AH 53.4 ?56.0 L Ulnar - ADM 33.6 ?32.0 R Tibial - AH 55.3 ?56.0 R Ulnar - ADM 32.2 ?32.0           EMG Summary Table   Spontaneous MUAP Recruitment Muscle IA Fib PSW Fasc Other Amp Dur. Poly Pattern R. Tibialis anterior Normal None None None _______ Increased Normal Normal Reduced R. Tibialis posterior Normal None None None _______ Increased Normal Normal Reduced R. Gastrocnemius (Medial head) Normal None None None _______ Increased Normal Normal Reduced R. Peroneus longus Normal None None None _______ Increased Normal Normal Reduced  R. Vastus lateralis Normal None None None _______ Increased Normal Normal Reduced R. Lumbar paraspinals (low) Normal None None None _______ Normal Normal Normal Normal R. Lumbar paraspinals (mid) Normal None None None _______ Normal Normal Normal Normal R. First dorsal interosseous Increased None None None _______ Increased Normal Normal Reduced R. Flexor digitorum profundus (Ulnar) Normal None None None _______ Increased Normal Normal Reduced R. Biceps brachii Increased None None None _______ Increased Increased 1+ Reduced R. Deltoid Normal None None None _______ Increased Increased 1+ Reduced R. Triceps brachii Normal None None None _______ Increased Normal Normal Reduced R. Cervical paraspinals Increased None None None _______ Increased Increased 2+ Normal R. Abductor digiti minimi (manus) Normal None None None _______ Increased Normal Normal Reduced     Labs:  CBC: Recent Labs    03/27/19 1455 07/02/19 1559 07/11/19 1202 07/22/19 0625  WBC 8.1 9.7 10.5 11.9*  HGB 12.8* 14.0 12.0* 13.0  HCT 38.8* 41.1 35.3* 38.8*  PLT 277 382 426* 606*    COAGS: Recent Labs    07/22/19 0625  INR 1.1    BMP: Recent Labs    03/27/19 1455  03/27/19 1455 07/02/19 1559 07/02/19 1559 07/11/19 1202 07/16/19 1315 07/18/19 1107 07/22/19 0625  NA 137   < > 130*   < > 128* 125* 127* 126*  K 4.0   < > 4.1   < > 4.4 4.2 4.3 4.1  CL 104   < > 95*   < > 95* 91* 95* 93*  CO2 23   < > 26   < > 25 26 20 22   GLUCOSE 98   < > 97   < > 108* 84 141* 103*  BUN 21   < > 20   < > 20 18 18 16   CALCIUM 9.3   < > 9.1   < > 8.8* 9.3 9.3 9.5  CREATININE 0.98   < > 0.99   < > 1.00 0.95 0.86 1.00  GFRNONAA >60  --  >60  --  >60  --   --  >60  GFRAA >60  --  >60  --  >60  --   --  >60   < > = values in this interval not displayed.    LIVER FUNCTION TESTS: Recent Labs    08/23/18 0826 05/09/19 1512 07/02/19 1559 07/11/19 1202 07/16/19 1315  BILITOT 0.6  --  0.6 0.5 0.4  AST 17  --  27 71* 35  ALT 11  --  21 77* 48  ALKPHOS 74  --  83 106 111  PROT 7.2 7.5 8.5* 8.2* 7.6  ALBUMIN 4.1  --  4.4 3.4* 3.9    TUMOR MARKERS: Recent Labs    08/23/18 0826  CEA 5.8*    Assessment and Plan:  86 y.o, male outpatient. History of HTN, hypothyroidism, GB syndrome, adenocarcinoma of the colon s/p resection found to have a hypermetabolic left upper lobe mass with osseous involvement on PET scan from 6.24.2021  Originally found on CT during  ED visit on 6.16.21 for [facility] and cough  Team is requesting lung biopsy LUL for further determination.  Pertinent Imaging 6.24.21 - PET reads hypermetabolic left upper lobe lung mass, including at 6.8 X 3.9 cm and a S.U.V. max of 20.3  Pertinent IR History none  Pertinent Allergies None   COVID - on 7.3.21 Na 126, WBC is 1.9All other labs and medications are within acceptable parameters. Patient is afebrile.  Risks and benefits of left  upper lobe lung biopsy was discussed with the patient and/or patient's family including, but not limited to bleeding, infection, damage to adjacent structures or low yield requiring additional tests.  All of the questions were answered and there is agreement to  proceed.  Consent signed and in chart.      Thank you for this interesting consult.  I greatly enjoyed meeting Julyan [place] and look forward to participating in their care.  A copy of this report was sent to the requesting provider on this date.  Electronically Signed: Avel Peace, NP 07/22/2019, 7:43 AM   I spent a total of  40 Minutes   in face to face in clinical consultation, greater than 50% of which was counseling/coordinating care for Lung biopsy

## 2019-07-22 ENCOUNTER — Other Ambulatory Visit: Payer: Self-pay

## 2019-07-22 ENCOUNTER — Ambulatory Visit (HOSPITAL_COMMUNITY)
Admission: RE | Admit: 2019-07-22 | Discharge: 2019-07-22 | Disposition: A | Discharge status: Home / Self Care | Payer: Medicare HMO | Source: Ambulatory Visit | Attending: Emergency Medicine | Admitting: Emergency Medicine

## 2019-07-22 ENCOUNTER — Encounter (HOSPITAL_COMMUNITY): Payer: Self-pay

## 2019-07-22 DIAGNOSIS — C3412 Malignant neoplasm of upper lobe, left bronchus or lung: Secondary | ICD-10-CM | POA: Diagnosis not present

## 2019-07-22 DIAGNOSIS — E039 Hypothyroidism, unspecified: Secondary | ICD-10-CM | POA: Insufficient documentation

## 2019-07-22 DIAGNOSIS — R918 Other nonspecific abnormal finding of lung field: Secondary | ICD-10-CM | POA: Diagnosis not present

## 2019-07-22 DIAGNOSIS — Z87891 Personal history of nicotine dependence: Secondary | ICD-10-CM | POA: Diagnosis not present

## 2019-07-22 DIAGNOSIS — K219 Gastro-esophageal reflux disease without esophagitis: Secondary | ICD-10-CM | POA: Insufficient documentation

## 2019-07-22 DIAGNOSIS — Z85030 Personal history of malignant carcinoid tumor of large intestine: Secondary | ICD-10-CM | POA: Diagnosis not present

## 2019-07-22 DIAGNOSIS — Z79899 Other long term (current) drug therapy: Secondary | ICD-10-CM | POA: Diagnosis not present

## 2019-07-22 DIAGNOSIS — I10 Essential (primary) hypertension: Secondary | ICD-10-CM | POA: Insufficient documentation

## 2019-07-22 DIAGNOSIS — Z79890 Hormone replacement therapy (postmenopausal): Secondary | ICD-10-CM | POA: Diagnosis not present

## 2019-07-22 DIAGNOSIS — G610 Guillain-Barre syndrome: Secondary | ICD-10-CM | POA: Insufficient documentation

## 2019-07-22 DIAGNOSIS — Z85038 Personal history of other malignant neoplasm of large intestine: Secondary | ICD-10-CM | POA: Diagnosis not present

## 2019-07-22 DIAGNOSIS — E785 Hyperlipidemia, unspecified: Secondary | ICD-10-CM | POA: Insufficient documentation

## 2019-07-22 DIAGNOSIS — Z7982 Long term (current) use of aspirin: Secondary | ICD-10-CM | POA: Diagnosis not present

## 2019-07-22 LAB — CBC
HCT: 38.8 % — ABNORMAL LOW (ref 39.0–52.0)
Hemoglobin: 13 g/dL (ref 13.0–17.0)
MCH: 32.3 pg (ref 26.0–34.0)
MCHC: 33.5 g/dL (ref 30.0–36.0)
MCV: 96.3 fL (ref 80.0–100.0)
Platelets: 606 10*3/uL — ABNORMAL HIGH (ref 150–400)
RBC: 4.03 MIL/uL — ABNORMAL LOW (ref 4.22–5.81)
RDW: 12.2 % (ref 11.5–15.5)
WBC: 11.9 10*3/uL — ABNORMAL HIGH (ref 4.0–10.5)
nRBC: 0 % (ref 0.0–0.2)

## 2019-07-22 LAB — BASIC METABOLIC PANEL
Anion gap: 11 (ref 5–15)
BUN: 16 mg/dL (ref 8–23)
CO2: 22 mmol/L (ref 22–32)
Calcium: 9.5 mg/dL (ref 8.9–10.3)
Chloride: 93 mmol/L — ABNORMAL LOW (ref 98–111)
Creatinine, Ser: 1 mg/dL (ref 0.61–1.24)
GFR calc Af Amer: >60 mL/min (ref >60)
GFR calc non Af Amer: >60 mL/min (ref >60)
Glucose, Bld: 103 mg/dL — ABNORMAL HIGH (ref 70–99)
Potassium: 4.1 mmol/L (ref 3.5–5.1)
Sodium: 126 mmol/L — ABNORMAL LOW (ref 135–145)

## 2019-07-22 LAB — PROTIME-INR
INR: 1.1 (ref 0.8–1.2)
Prothrombin Time: 13.3 seconds (ref 11.4–15.2)

## 2019-07-22 LAB — SODIUM, URINE, RANDOM: Sodium, Ur: 58 mmol/L (ref 28–272)

## 2019-07-22 LAB — OSMOLALITY, URINE: Osmolality, Ur: 465 mOsm/kg (ref 50–1200)

## 2019-07-22 MED ORDER — FENTANYL CITRATE (PF) 100 MCG/2ML IJ SOLN
INTRAMUSCULAR | Status: AC
  Filled 2019-07-22: qty 2

## 2019-07-22 MED ORDER — LIDOCAINE HCL 1 % IJ SOLN
INTRAMUSCULAR | Status: AC
  Filled 2019-07-22: qty 20

## 2019-07-22 MED ORDER — MIDAZOLAM HCL 2 MG/2ML IJ SOLN
INTRAMUSCULAR | Status: AC
  Filled 2019-07-22: qty 2

## 2019-07-22 MED ORDER — SODIUM CHLORIDE 0.9 % IV SOLN
INTRAVENOUS | Status: AC | PRN
  Administered 2019-07-22: 10 mL/h via INTRAVENOUS

## 2019-07-22 MED ORDER — HYDROCODONE-ACETAMINOPHEN 5-325 MG PO TABS: 1.0000 | ORAL_TABLET | ORAL | Status: DC | PRN

## 2019-07-22 MED ORDER — SODIUM CHLORIDE 0.9 % IV SOLN: INTRAVENOUS | Status: DC

## 2019-07-22 MED ORDER — FENTANYL CITRATE (PF) 100 MCG/2ML IJ SOLN
INTRAMUSCULAR | Status: AC | PRN
  Administered 2019-07-22 (×2): 25 ug via INTRAVENOUS

## 2019-07-22 MED ORDER — MIDAZOLAM HCL 2 MG/2ML IJ SOLN
INTRAMUSCULAR | Status: AC | PRN
  Administered 2019-07-22: 1 mg via INTRAVENOUS
  Administered 2019-07-22: 0.5 mg via INTRAVENOUS

## 2019-07-22 NOTE — Discharge Instructions (Signed)
Lung Biopsy, Care After This sheet gives you information about how to care for yourself after your procedure. Your health care provider may also give you more specific instructions depending on the type of biopsy you had. If you have problems or questions, contact your health care provider. What can I expect after the procedure? After the procedure, it is common to have:  A cough.  A sore throat.  Pain where a needle, bronchoscope, or incision was used to collect a biopsy sample (biopsy site). Follow these instructions at home: Medicines  Take over-the-counter and prescription medicines only as told by your health care provider.  Do not drink alcohol if your health care provider tells you not to drink.  Ask your health care provider if the medicine prescribed to you: ? Requires you to avoid driving or using heavy machinery. ? Can cause constipation. You may need to take these actions to prevent or treat constipation:  Drink enough fluid to keep your urine pale yellow.  Take over-the-counter or prescription medicines.  Eat foods that are high in fiber, such as beans, whole grains, and fresh fruits and vegetables.  Limit foods that are high in fat and processed sugars, such as fried or sweet foods.  Do not drive for 24 hours if you were given a sedative. Biopsy site care   Follow instructions from your health care provider about how to take care of your biopsy site. Make sure you: ? Wash your hands with soap and water before and after you change your bandage (dressing). If soap and water are not available, use hand sanitizer. ? Change your dressing as told by your health care provider. ? Leave stitches (sutures), skin glue, or adhesive strips in place. These skin closures may need to stay in place for 2 weeks or longer. If adhesive strip edges start to loosen and curl up, you may trim the loose edges. Do not remove adhesive strips completely unless your health care provider tells  you to do that.  Do not take baths, swim, or use a hot tub until your health care provider approves. Ask your health care provider if you may take showers. You may only be allowed to take sponge baths.  Check your biopsy site every day for signs of infection. Check for: ? Redness, swelling, or more pain. ? Fluid or blood. ? Warmth. ? Pus or a bad smell. General instructions  Return to your normal activities as told by your health care provider. Ask your health care provider what activities are safe for you.  It is up to you to get the results of your procedure. Ask your health care provider, or the department that is doing the procedure, when your results will be ready.  Keep all follow-up visits as told by your health care provider. This is important. Contact a health care provider if:  You have a fever.  You have redness, swelling, or more pain around your biopsy site.  You have fluid or blood coming from your biopsy site.  Your biopsy site feels warm to the touch.  You have pus or a bad smell coming from your biopsy site.  You have pain that does not get better with medicine. Get help right away if:  You cough up blood.  You have trouble breathing.  You have chest pain.  You lose consciousness. Summary  After the procedure, it is common to have a sore throat and a cough.  Return to your normal activities as told by your  health care provider. Ask your health care provider what activities are safe for you.  Take over-the-counter and prescription medicines only as told by your health care provider.  Report any unusual symptoms to your health care provider. This information is not intended to replace advice given to you by your health care provider. Make sure you discuss any questions you have with your health care provider. Document Revised: 02/06/2018 Document Reviewed: 02/01/2016 Elsevier Patient Education  [place]. Moderate Conscious Sedation,  Adult Sedation is the use of medicines to promote relaxation and relieve discomfort and anxiety. Moderate conscious sedation is a type of sedation. Under moderate conscious sedation, you are less alert than normal, but you are still able to respond to instructions, touch, or both. Moderate conscious sedation is used during short medical and dental procedures. It is milder than deep sedation, which is a type of sedation under which you cannot be easily woken up. It is also milder than general anesthesia, which is the use of medicines to make you unconscious. Moderate conscious sedation allows you to return to your regular activities sooner. Tell a health care provider about:  Any allergies you have.  All medicines you are taking, including vitamins, herbs, eye drops, creams, and over-the-counter medicines.  Use of steroids (by mouth or creams).  Any problems you or family members have had with sedatives and anesthetic medicines.  Any blood disorders you have.  Any surgeries you have had.  Any medical conditions you have, such as sleep apnea.  Whether you are pregnant or may be pregnant.  Any use of cigarettes, alcohol, marijuana, or street drugs. What are the risks? Generally, this is a safe procedure. However, problems may occur, including:  Getting too much medicine (oversedation).  Nausea.  Allergic reaction to medicines.  Trouble breathing. If this happens, a breathing tube may be used to help with breathing. It will be removed when you are awake and breathing on your own.  Heart trouble.  Lung trouble. What happens before the procedure? Staying hydrated Follow instructions from your health care provider about hydration, which may include:  Up to 2 hours before the procedure - you may continue to drink clear liquids, such as water, clear fruit juice, black coffee, and plain tea. Eating and drinking restrictions Follow instructions from your health care provider about  eating and drinking, which may include:  8 hours before the procedure - stop eating heavy meals or foods such as meat, fried foods, or fatty foods.  6 hours before the procedure - stop eating light meals or foods, such as toast or cereal.  6 hours before the procedure - stop drinking milk or drinks that contain milk.  2 hours before the procedure - stop drinking clear liquids. Medicine Ask your health care provider about:  Changing or stopping your regular medicines. This is especially important if you are taking diabetes medicines or blood thinners.  Taking medicines such as aspirin and ibuprofen. These medicines can thin your blood. Do not take these medicines before your procedure if your health care provider instructs you not to.  Tests and exams  You will have a physical exam.  You may have blood tests done to show: ? How well your kidneys and liver are working. ? How well your blood can clot. General instructions  Plan to have someone take you home from the hospital or clinic.  If you will be going home right after the procedure, plan to have someone with you for 24 hours.  What happens during the procedure?  An IV tube will be inserted into one of your veins.  Medicine to help you relax (sedative) will be given through the IV tube.  The medical or dental procedure will be performed. What happens after the procedure?  Your blood pressure, heart rate, breathing rate, and blood oxygen level will be monitored often until the medicines you were given have worn off.  Do not drive for 24 hours. This information is not intended to replace advice given to you by your health care provider. Make sure you discuss any questions you have with your health care provider. Document Revised: 12/15/2016 Document Reviewed: 04/24/2015 Elsevier Patient Education  2020 [employer].

## 2019-07-22 NOTE — Progress Notes (Signed)
Discharge instructions reviewed with pt and his wife (via telephone) both voice understanding.  

## 2019-07-22 NOTE — Procedures (Signed)
  Procedure: CT core LUL lung mass   EBL:   minimal Complications:  none immediate  See full dictation in [facility].  Dillard Cannon MD Main # [phone] Pager  [phone]

## 2019-07-23 LAB — SURGICAL PATHOLOGY

## 2019-07-24 ENCOUNTER — Ambulatory Visit: Payer: Medicare HMO | Admitting: Neurology

## 2019-07-29 ENCOUNTER — Other Ambulatory Visit: Payer: Self-pay

## 2019-07-29 ENCOUNTER — Other Ambulatory Visit: Payer: Medicare HMO

## 2019-07-29 MED ORDER — GABAPENTIN 100 MG PO CAPS: 100.0000 mg | ORAL_CAPSULE | Freq: Every day | ORAL | 0 refills | Status: AC

## 2019-07-30 ENCOUNTER — Other Ambulatory Visit: Payer: Self-pay

## 2019-07-30 ENCOUNTER — Inpatient Hospital Stay: Payer: Medicare HMO | Attending: Hematology and Oncology | Admitting: Hematology and Oncology

## 2019-07-30 ENCOUNTER — Inpatient Hospital Stay: Payer: Medicare HMO

## 2019-07-30 ENCOUNTER — Other Ambulatory Visit: Payer: Self-pay | Admitting: Hematology and Oncology

## 2019-07-30 ENCOUNTER — Encounter: Payer: Self-pay | Admitting: Hematology and Oncology

## 2019-07-30 VITALS — BP 118/70 | HR 76 | Temp 98.8°F | Resp 18 | Ht 69.0 in | Wt 164.3 lb

## 2019-07-30 DIAGNOSIS — R0602 Shortness of breath: Secondary | ICD-10-CM | POA: Insufficient documentation

## 2019-07-30 DIAGNOSIS — K449 Diaphragmatic hernia without obstruction or gangrene: Secondary | ICD-10-CM | POA: Diagnosis not present

## 2019-07-30 DIAGNOSIS — Z79899 Other long term (current) drug therapy: Secondary | ICD-10-CM | POA: Diagnosis not present

## 2019-07-30 DIAGNOSIS — R05 Cough: Secondary | ICD-10-CM | POA: Insufficient documentation

## 2019-07-30 DIAGNOSIS — J439 Emphysema, unspecified: Secondary | ICD-10-CM | POA: Diagnosis not present

## 2019-07-30 DIAGNOSIS — K5730 Diverticulosis of large intestine without perforation or abscess without bleeding: Secondary | ICD-10-CM | POA: Insufficient documentation

## 2019-07-30 DIAGNOSIS — C7951 Secondary malignant neoplasm of bone: Secondary | ICD-10-CM | POA: Diagnosis not present

## 2019-07-30 DIAGNOSIS — C782 Secondary malignant neoplasm of pleura: Secondary | ICD-10-CM | POA: Insufficient documentation

## 2019-07-30 DIAGNOSIS — M2578 Osteophyte, vertebrae: Secondary | ICD-10-CM | POA: Insufficient documentation

## 2019-07-30 DIAGNOSIS — C3412 Malignant neoplasm of upper lobe, left bronchus or lung: Secondary | ICD-10-CM | POA: Insufficient documentation

## 2019-07-30 DIAGNOSIS — I2510 Atherosclerotic heart disease of native coronary artery without angina pectoris: Secondary | ICD-10-CM | POA: Diagnosis not present

## 2019-07-30 DIAGNOSIS — Z86010 Personal history of colonic polyps: Secondary | ICD-10-CM | POA: Insufficient documentation

## 2019-07-30 DIAGNOSIS — I712 Thoracic aortic aneurysm, without rupture: Secondary | ICD-10-CM | POA: Diagnosis not present

## 2019-07-30 DIAGNOSIS — C3490 Malignant neoplasm of unspecified part of unspecified bronchus or lung: Secondary | ICD-10-CM

## 2019-07-30 DIAGNOSIS — N400 Enlarged prostate without lower urinary tract symptoms: Secondary | ICD-10-CM | POA: Insufficient documentation

## 2019-07-30 DIAGNOSIS — K439 Ventral hernia without obstruction or gangrene: Secondary | ICD-10-CM | POA: Diagnosis not present

## 2019-07-30 DIAGNOSIS — Z8719 Personal history of other diseases of the digestive system: Secondary | ICD-10-CM | POA: Insufficient documentation

## 2019-07-30 DIAGNOSIS — E871 Hypo-osmolality and hyponatremia: Secondary | ICD-10-CM

## 2019-07-30 DIAGNOSIS — Z823 Family history of stroke: Secondary | ICD-10-CM | POA: Insufficient documentation

## 2019-07-30 DIAGNOSIS — R531 Weakness: Secondary | ICD-10-CM | POA: Insufficient documentation

## 2019-07-30 DIAGNOSIS — M160 Bilateral primary osteoarthritis of hip: Secondary | ICD-10-CM | POA: Insufficient documentation

## 2019-07-30 DIAGNOSIS — I700 Atherosclerosis of aorta: Secondary | ICD-10-CM | POA: Diagnosis not present

## 2019-07-30 DIAGNOSIS — J849 Interstitial pulmonary disease, unspecified: Secondary | ICD-10-CM | POA: Diagnosis not present

## 2019-07-30 DIAGNOSIS — C787 Secondary malignant neoplasm of liver and intrahepatic bile duct: Secondary | ICD-10-CM | POA: Diagnosis not present

## 2019-07-30 DIAGNOSIS — M4802 Spinal stenosis, cervical region: Secondary | ICD-10-CM | POA: Diagnosis not present

## 2019-07-30 DIAGNOSIS — Z87891 Personal history of nicotine dependence: Secondary | ICD-10-CM | POA: Insufficient documentation

## 2019-07-30 DIAGNOSIS — J984 Other disorders of lung: Secondary | ICD-10-CM | POA: Insufficient documentation

## 2019-07-30 DIAGNOSIS — Z8249 Family history of ischemic heart disease and other diseases of the circulatory system: Secondary | ICD-10-CM | POA: Insufficient documentation

## 2019-07-30 DIAGNOSIS — M5136 Other intervertebral disc degeneration, lumbar region: Secondary | ICD-10-CM | POA: Insufficient documentation

## 2019-07-30 DIAGNOSIS — E039 Hypothyroidism, unspecified: Secondary | ICD-10-CM | POA: Diagnosis not present

## 2019-07-30 DIAGNOSIS — R599 Enlarged lymph nodes, unspecified: Secondary | ICD-10-CM | POA: Diagnosis not present

## 2019-07-30 DIAGNOSIS — Z8042 Family history of malignant neoplasm of prostate: Secondary | ICD-10-CM | POA: Insufficient documentation

## 2019-07-30 DIAGNOSIS — Z801 Family history of malignant neoplasm of trachea, bronchus and lung: Secondary | ICD-10-CM | POA: Insufficient documentation

## 2019-07-30 LAB — CBC WITH DIFFERENTIAL (CANCER CENTER ONLY)
Abs Immature Granulocytes: 0.04 10*3/uL (ref 0.00–0.07)
Basophils Absolute: 0.1 10*3/uL (ref 0.0–0.1)
Basophils Relative: 1 %
Eosinophils Absolute: 0.5 10*3/uL (ref 0.0–0.5)
Eosinophils Relative: 5 %
HCT: 36.4 % — ABNORMAL LOW (ref 39.0–52.0)
Hemoglobin: 12.5 g/dL — ABNORMAL LOW (ref 13.0–17.0)
Immature Granulocytes: 0 %
Lymphocytes Relative: 13 %
Lymphs Abs: 1.4 10*3/uL (ref 0.7–4.0)
MCH: 32.7 pg (ref 26.0–34.0)
MCHC: 34.3 g/dL (ref 30.0–36.0)
MCV: 95.3 fL (ref 80.0–100.0)
Monocytes Absolute: 1.6 10*3/uL — ABNORMAL HIGH (ref 0.1–1.0)
Monocytes Relative: 15 %
Neutro Abs: 7.2 10*3/uL (ref 1.7–7.7)
Neutrophils Relative %: 66 %
Platelet Count: 360 10*3/uL (ref 150–400)
RBC: 3.82 MIL/uL — ABNORMAL LOW (ref 4.22–5.81)
RDW: 12.5 % (ref 11.5–15.5)
WBC Count: 10.8 10*3/uL — ABNORMAL HIGH (ref 4.0–10.5)
nRBC: 0 % (ref 0.0–0.2)

## 2019-07-30 LAB — CMP (CANCER CENTER ONLY)
ALT: 26 U/L (ref 0–44)
AST: 42 U/L — ABNORMAL HIGH (ref 15–41)
Albumin: 3.2 g/dL — ABNORMAL LOW (ref 3.5–5.0)
Alkaline Phosphatase: 120 U/L (ref 38–126)
Anion gap: 8 (ref 5–15)
BUN: 19 mg/dL (ref 8–23)
CO2: 22 mmol/L (ref 22–32)
Calcium: 9.5 mg/dL (ref 8.9–10.3)
Chloride: 93 mmol/L — ABNORMAL LOW (ref 98–111)
Creatinine: 0.96 mg/dL (ref 0.61–1.24)
GFR, Est AFR Am: >60 mL/min (ref >60)
GFR, Estimated: >60 mL/min (ref >60)
Glucose, Bld: 82 mg/dL (ref 70–99)
Potassium: 5 mmol/L (ref 3.5–5.1)
Sodium: 123 mmol/L — ABNORMAL LOW (ref 135–145)
Total Bilirubin: 0.5 mg/dL (ref 0.3–1.2)
Total Protein: 7.8 g/dL (ref 6.5–8.1)

## 2019-07-30 LAB — LACTATE DEHYDROGENASE: LDH: 295 U/L — ABNORMAL HIGH (ref 98–192)

## 2019-07-30 NOTE — Progress Notes (Signed)
Ronald [phone]   [phone]  PROGRESS NOTE  Patient Care Team: Ronald Post, MD as PCP - Ronald Gutierrez, Ronald Minister, DO as Consulting Physician (Neurology)  Hematological/Oncological History # Extensive Stage Small Cell Lung Cancer 1) 07/02/2019: presented to the emergency department with shortness of breath/cough. CT scan of the chest revealed large left upper lobe pleural base mass, with multiple satellite lesions extending toward the left hilum, consistent with malignancy 2) 07/02/2019: established with Dr. Lorenso Gutierrez in the ED.  3) 07/10/2019: PET CT scan showed left upper lobe primary bronchogenic carcinoma with left pleural, thoracic nodal, left adrenal, and osseous metastasis and hypermetabolism within the hepatic dome.  4) 07/22/2019: biopsy of the lung mass performed, confirmed small cell lung cancer   #Colon Cancer 1) 07/30/2008: underwent resection of transverse colon mass, found to be adenocarcinoma of the colon, KRAS WT. Preoperative CEA was 34.9. Noted to be T4N0M0  Interval History:  Ronald Gutierrez 86 y.o. male with medical history significant for extensive stage small cell lung cancer who presents for a follow up visit. The patient's last visit was on 07/02/2019 at which time I established care with him in the ED. In the interim since the last visit Ronald Gutierrez has completed his PET CT scan and lung biopsy which confirm extensive stage small cell lung cancer with metastatic spread to the abdomen, spine, and liver.   On exam today Ronald Gutierrez is accompanied by his wife.  She reports that he has had progressive worsening of his symptoms with increased shortness of breath.  He reports that he can walk down his long hallway in his home 70 to 80 feet there and back but at the end is markedly short of breath and reports that he is "gasping" he notes that he is using a walker as well to help him ambulate.  He notes that he could not get very far without his  walker.  He does note that his breathing is okay when he has not ambulating and when he is sitting in a chair.  He denies having any issues with fevers, chills, sweats, nausea, vomiting or diarrhea.  He denies having any bone or back pain at this time and is not having any dysfunction of bowel or bladder.  He denies having any bloody cough, but is having some issues with cough overall.  A full 10 point ROS is listed below.  MEDICAL HISTORY:  Past Medical History:  Diagnosis Date  . ANEMIA   . Colon adenocarcinoma ([place]) 05/2008   T4, N0  . Diverticulitis   . FATIGUE 05/13/2009  . GERD 05/25/2008  . Guillain-Barre syndrome ([place])   . HYPERLIPIDEMIA 05/20/2008  . HYPERTENSION 05/20/2008  . Hypothyroidism   . KERATOSIS 10/21/2008  . Tubular adenoma of colon 10/2009    SURGICAL HISTORY: Past Surgical History:  Procedure Laterality Date  . APPENDECTOMY  1948  . CATARACT EXTRACTION    . CERVICAL LAMINECTOMY  1968  . COLECTOMY  07/2008   transverse  . HEMORRHOID SURGERY    . HERNIA REPAIR     inguinal  . LUMBAR LAMINECTOMY  1970  . OMENTECTOMY      SOCIAL HISTORY: Social History   Socioeconomic History  . Marital status: Married    Spouse name: Not on file  . Number of children: Not on file  . Years of education: Not on file  . Highest education level: Not on file  Occupational History  . Occupation: Retired  Employer: RETIRED  Tobacco Use  . Smoking status: Former Smoker    Packs/day: 1.50    Years: 15.00    Pack years: 22.50    Types: Cigarettes    Quit date: 01/17/1968    Years since quitting: 51.5  . Smokeless tobacco: Never Used  . Tobacco comment: aged out of preventive screens  Vaping Use  . Vaping Use: Never used  Substance and Sexual Activity  . Alcohol use: Not Currently  . Drug use: No  . Sexual activity: Not on file  Other Topics Concern  . Not on file  Social History Narrative   Right handed   Lives with wife in one story home   Social Determinants of  Health   Financial Resource Strain:   . Difficulty of Paying Living Expenses:   Food Insecurity:   . Worried About Charity fundraiser in the Last Year:   . Arboriculturist in the Last Year:   Transportation Needs:   . Film/video editor (Medical):   Marland Kitchen Lack of Transportation (Non-Medical):   Physical Activity:   . Days of Exercise per Week:   . Minutes of Exercise per Session:   Stress:   . Feeling of Stress :   Social Connections:   . Frequency of Communication with Friends and Family:   . Frequency of Social Gatherings with Friends and Family:   . Attends Religious Services:   . Active Member of Clubs or Organizations:   . Attends Archivist Meetings:   Marland Kitchen Marital Status:   Intimate Partner Violence:   . Fear of Current or Ex-Partner:   . Emotionally Abused:   Marland Kitchen Physically Abused:   . Sexually Abused:     FAMILY HISTORY: Family History  Problem Relation Age of Onset  . Lung cancer Brother   . Heart disease Brother   . Prostate cancer Brother   . Stroke Father     ALLERGIES:  is allergic to ferrous sulfate.  MEDICATIONS:  Current Outpatient Medications  Medication Sig Dispense Refill  . aspirin 81 MG tablet Take 81 mg by mouth daily.    . fexofenadine (ALLEGRA) 180 MG tablet Take 180 mg by mouth daily.    Marland Kitchen gabapentin (NEURONTIN) 100 MG capsule Take 1 capsule (100 mg total) by mouth at bedtime. 30 capsule 0  . levothyroxine (SYNTHROID) 75 MCG tablet Take 1 tablet (75 mcg total) by mouth daily. 90 tablet 3  . lisinopril (ZESTRIL) 20 MG tablet TAKE ONE TABLET BY MOUTH DAILY (Patient taking differently: Take 20 mg by mouth daily. ) 90 tablet 2  . Multiple Vitamins-Minerals (CENTRUM ULTRA MENS PO) Take 1 tablet by mouth daily.      Marland Kitchen omeprazole (PRILOSEC) 20 MG capsule TAKE ONE CAPSULE BY MOUTH DAILY (Patient taking differently: Take 20 mg by mouth daily. TAKE ONE CAPSULE BY MOUTH DAILY .) 90 capsule 2  . simvastatin (ZOCOR) 20 MG tablet TAKE ONE TABLET BY  MOUTH AT BEDTIME 90 tablet 0   No current facility-administered medications for this visit.    REVIEW OF SYSTEMS:   Constitutional: ( - ) fevers, ( - )  chills , ( - ) night sweats Eyes: ( - ) blurriness of vision, ( - ) double vision, ( - ) watery eyes Ears, nose, mouth, throat, and face: ( - ) mucositis, ( - ) sore throat Respiratory: ( + ) cough, ( +++ ) dyspnea, ( - ) wheezes Cardiovascular: ( - ) palpitation, ( - )  chest discomfort, ( - ) lower extremity swelling Gastrointestinal:  ( - ) nausea, ( - ) heartburn, ( - ) change in bowel habits Skin: ( - ) abnormal skin rashes Lymphatics: ( - ) new lymphadenopathy, ( - ) easy bruising Neurological: ( - ) numbness, ( - ) tingling, ( - ) new weaknesses Behavioral/Psych: ( - ) mood change, ( - ) new changes  All other systems were reviewed with the patient and are negative.  PHYSICAL EXAMINATION: ECOG PERFORMANCE STATUS: 3 - Symptomatic, >50% confined to bed  Vitals:   07/30/19 1445  BP: 118/70  Pulse: 76  Resp: 18  Temp: 98.8 F (37.1 C)  SpO2: 96%   Filed Weights   07/30/19 1445  Weight: 164 lb 4.8 oz (74.5 kg)    GENERAL: chronically ill appearing elderly Caucasian male, alert, no distress and comfortable SKIN: skin color, texture, turgor are normal, no rashes or significant lesions EYES: conjunctiva are pink and non-injected, sclera clear LUNGS: clear to auscultation and percussion with normal breathing effort HEART: regular rate & rhythm and no murmurs and no lower extremity edema Musculoskeletal: no cyanosis of digits and no clubbing  PSYCH: alert & oriented x 3, fluent speech NEURO: no focal motor/sensory deficits  LABORATORY DATA:  I have reviewed the data as listed CBC Latest Ref Rng & Units 07/30/2019 07/22/2019 07/11/2019  WBC 4.0 - 10.5 K/uL 10.8(H) 11.9(H) 10.5  Hemoglobin 13.0 - 17.0 g/dL 12.5(L) 13.0 12.0(L)  Hematocrit 39 - 52 % 36.4(L) 38.8(L) 35.3(L)  Platelets 150 - 400 K/uL 360 606(H) 426(H)    CMP  Latest Ref Rng & Units 07/30/2019 07/22/2019 07/18/2019  Glucose 70 - 99 mg/dL 82 103(H) 141(H)  BUN 8 - 23 mg/dL _0 Creatinine 0.61 - 1.24 mg/dL 0.96 1.00 0.86  Sodium 135 - 145 mmol/L 123(L) 126(L) 127(L)  Potassium 3.5 - 5.1 mmol/L 5.0 4.1 4.3  Chloride 98 - 111 mmol/L 93(L) 93(L) 95(L)  CO2 22 - 32 mmol/L _1 Calcium 8.9 - 10.3 mg/dL 9.5 9.5 9.3  Total Protein 6.5 - 8.1 g/dL 7.8 - -  Total Bilirubin 0.3 - 1.2 mg/dL 0.5 - -  Alkaline Phos 38 - 126 U/L 120 - -  AST 15 - 41 U/L 42(H) - -  ALT 0 - 44 U/L 26 - -    Lab Results  Component Value Date   MPROTEIN Not Observed 05/09/2019    RADIOGRAPHIC STUDIES: I have personally reviewed the radiological images as listed and agreed with the findings in the report: PET ct scan shows lesions of the spine, left upper lung, liver, and adrenals.   DG Chest 2 View  Result Date: 07/02/2019 CLINICAL DATA:  Wheezing EXAM: CHEST - 2 VIEW COMPARISON:  CT abdomen pelvis 06/18/2015, 07/18/2017, radiograph 08/23/2018, 05/14/2016 FINDINGS: New masslike consolidation seen in the periphery of the left chest with additional more focal opacity just medial. There are hazy and reticular opacities elsewhere throughout the lungs with central congestion including cephalized, indistinct pulmonary vascularity. No pneumothorax or effusion. Slight prominence of the cardiac silhouette though possibly related to portable technique. No acute osseous or soft tissue abnormality. Multilevel degenerative changes are present in the imaged portions of the spine. IMPRESSION: 1. New masslike consolidation in the periphery of the left chest with additional more focal opacity just medial. Recommend further evaluation with contrast-enhanced CT. 2. Additional hazy and reticular opacities throughout the lungs with central congestion, could reflect pulmonary edema superimposed on a background of  more chronic fibrosis. Electronically Signed   By: Lovena Le M.D.   On: 07/02/2019  15:38   CT Chest W Contrast  Result Date: 07/02/2019 CLINICAL DATA:  Wheezing for several days, interstitial lung disease, abnormal chest x-ray EXAM: CT CHEST WITH CONTRAST TECHNIQUE: Multidetector CT imaging of the chest was performed during intravenous contrast administration. CONTRAST:  60m OMNIPAQUE IOHEXOL 300 MG/ML  SOLN COMPARISON:  07/02/2019 FINDINGS: Cardiovascular: Heart is unremarkable without pericardial effusion. Minimal atherosclerosis within the thoracic aorta. Mild dilatation at the aortic root, measuring 4 cm in diameter. There is severe diffuse coronary artery atherosclerosis. Mediastinum/Nodes: There is pathologic adenopathy within the mediastinum and bilateral hila. Index nodes are as follows: Right hilum, image 78, 15 mm. Left hilum, image 65, 22 mm. AP window, image 59, 16 mm. Airways patent. Esophagus is unremarkable. There is a large hiatal hernia. The thyroid is normal. Lungs/Pleura: Bilobed pleural base left upper lobe mass. Largest component of the mass measures 6.3 x 3.6 cm in transverse dimension,, and extends approximately 5.9 cm in craniocaudal length. There are satellite nodules extending back toward the left hilum, measuring up to 1.5 cm. There is extensive scarring and fibrosis throughout the lungs consistent with severe emphysema. No evidence of pneumonia. No effusion or pneumothorax. Upper Abdomen: No acute abnormality. Musculoskeletal: No acute or destructive bony lesions. Reconstructed images demonstrate no additional findings. IMPRESSION: 1. Large left upper lobe pleural base mass, with multiple satellite lesions extending toward the left hilum, consistent with malignancy. 2. Metastatic adenopathy within the mediastinum and bilateral hila. 3. Aortic Atherosclerosis (ICD10-I70.0) and Emphysema (ICD10-J43.9). 4. Mild aneurysmal dilatation of the ascending aorta, measuring 4 cm. Recommend annual imaging followup by CTA or MRA. This recommendation follows 2010  ACCF/AHA/AATS/ACR/ASA/SCA/SCAI/SIR/STS/SVM Guidelines for the Diagnosis and Management of Patients with Thoracic Aortic Disease. Circulation. 2010; 121:: [id number] Aortic aneurysm NOS (ICD10-I71.9) Electronically Signed   By: MRanda NgoM.D.   On: 07/02/2019 18:20   MR Cervical Spine W or Wo Contrast  Result Date: 07/11/2019 CLINICAL DATA:  Bilateral lower extremity weakness since March. History of colon cancer. EXAM: MRI CERVICAL SPINE WITHOUT AND WITH CONTRAST TECHNIQUE: Multiplanar and multiecho pulse sequences of the cervical spine, to include the craniocervical junction and cervicothoracic junction, were obtained without and with intravenous contrast. CONTRAST:  79mGADAVIST GADOBUTROL 1 MMOL/ML IV SOLN COMPARISON:  PET-CT 07/10/2019 FINDINGS: Alignment: Cervical spine straightening. Grade 1 anterolisthesis of C7 on T1 and T1 on T2. Vertebrae: Remote, solid C4-C7 interbody fusion. No fracture, suspicious osseous lesion, or significant marrow edema. Cord: Mildly atrophic appearance of the cervical spinal cord diffusely. No focal signal abnormality or abnormal enhancement. Posterior Fossa, vertebral arteries, paraspinal tissues: Unremarkable. Disc levels: C2-3: Disc bulging, left uncovertebral spurring, and mild right and severe left facet arthrosis result in moderate left neural foraminal stenosis without spinal stenosis. C3-4: Broad-based posterior disc osteophyte complex and severe left facet arthrosis result in mild right and moderate left neural foraminal stenosis without spinal stenosis. C4-5: Anterior fusion.  No stenosis. C5-6: Anterior fusion.  No stenosis. C6-7: Anterior fusion.  No stenosis. C7-T1: Anterolisthesis with disc uncovering, uncovertebral spurring, and moderate facet arthrosis result in at most mild bilateral neural foraminal stenosis without spinal stenosis. IMPRESSION: 1. No evidence of metastatic disease in the cervical spine. 2. Remote C4-C7 interbody fusion without stenosis. 3.  Disc and advanced facet degeneration with moderate left neural foraminal stenosis at C2-3 and C3-4. 4. No spinal stenosis. 5. Mild diffuse spinal cord atrophy without focal cord abnormality. Electronically Signed  By: Logan Bores M.D.   On: 07/11/2019 14:20   MR THORACIC SPINE W WO CONTRAST  Result Date: 07/11/2019 CLINICAL DATA:  Bilateral lower extremity weakness since March. History of colon cancer. EXAM: MRI THORACIC WITHOUT AND WITH CONTRAST TECHNIQUE: Multiplanar and multiecho pulse sequences of the thoracic spine were obtained without and with intravenous contrast. CONTRAST:  45m GADAVIST GADOBUTROL 1 MMOL/ML IV SOLN COMPARISON:  PET-CT 07/10/2019.  Chest CT 07/02/2019. FINDINGS: Alignment: Grade 1 anterolisthesis of C7 on T1 and T1 on T2. Vertebrae: No fracture. 1.7 cm T2 hyperintense lesion anteriorly in the T12 vertebral body with only low level enhancement corresponding to an FDG avid lesion on PET-CT. Similar mildly enhancing subcentimeter lesions in the base of the T11 spinous process and left T9 transverse process. Subcentimeter lesion versus degenerative edema in the right T4 inferior articular process. No evidence of epidural tumor. Cord: Mildly atrophic appearance of the thoracic spinal cord diffusely. No focal signal abnormality or abnormal enhancement. Paraspinal and other soft tissues: Lung and mediastinal findings better evaluated on PET-CT. Disc levels: At T1-2, anterolisthesis with disc uncovering and facet and ligamentum flavum hypertrophy result in borderline to mild spinal stenosis. Small left paracentral disc protrusions from T2-3 to T4-5, small central disc protrusions at T5-6 and T6-7, and minimal lower thoracic disc bulging do not result in significant stenosis or spinal cord mass effect. IMPRESSION: 1. Multiple osseous lesions in the thoracic spine consistent with metastases, largest at T12. 2. No evidence of epidural tumor. 3. Mild thoracic spondylosis without significant  stenosis. 4. Mild diffuse spinal cord atrophy without focal abnormality. Electronically Signed   By: ALogan BoresM.D.   On: 07/11/2019 14:31   MR Lumbar Spine W Wo Contrast  Result Date: 07/11/2019 CLINICAL DATA:  Bilateral lower extremity weakness since March. History of colon cancer. EXAM: MRI LUMBAR SPINE WITHOUT AND WITH CONTRAST TECHNIQUE: Multiplanar and multiecho pulse sequences of the lumbar spine were obtained without and with intravenous contrast. CONTRAST:  767mGADAVIST GADOBUTROL 1 MMOL/ML IV SOLN COMPARISON:  PET-CT 07/10/2019. FINDINGS: Segmentation:  Standard. Alignment: Mild S-shaped lumbar scoliosis, convex left in the lower lumbar spine. Mild straightening of the normal lumbar lordosis. Trace retrolisthesis of L3 on L4 and L4 on L5. Vertebrae: No fracture. Multiple T1 hypointense, T2 hyperintense, variably enhancing lesions throughout the lumbar spine with the largest measuring 1.2 cm in the superior L1 vertebral body and 1.5 cm in the right inferolateral L2 vertebral body. Small lesions in the lumbar posterior elements, sacrum, and right ilium. No evidence of epidural tumor. Conus medullaris and cauda equina: Conus extends to the L1 level and appears normal. A few small foci of signal hyperintensity in the anterior aspect of the spinal canal at L2-3 on axial precontrast and postcontrast T1 images may be artifactual as they are not evident on sagittal images, nor is there a corresponding abnormality on T2 weighted sequences. No convincing cauda equina nerve root enhancement or thickening is identified to suggest leptomeningeal metastatic disease. Paraspinal and other soft tissues: Unremarkable. Disc levels: Disc desiccation throughout the lumbar spine with severe disc space narrowing from L2-3 to L5-S1. T12-L1: Mild facet hypertrophy without disc herniation or stenosis. L1-2: Disc bulging and moderate facet and ligamentum flavum hypertrophy result in mild spinal stenosis, mild bilateral  lateral recess stenosis, and borderline to mild left neural foraminal stenosis. L2-3: Circumferential disc osteophyte complex and moderate to severe facet and ligamentum flavum hypertrophy result in moderate spinal stenosis, severe bilateral lateral recess stenosis, and mild left  neural foraminal stenosis. Potential bilateral L3 nerve root impingement. L3-4: Possible right laminotomy. Circumferential disc bulging eccentric to the right and moderate facet and ligamentum flavum hypertrophy result in mild-to-moderate spinal stenosis, severe bilateral lateral recess stenosis, and moderate right and mild left neural foraminal stenosis. Potential bilateral L4 nerve root impingement. L4-5: Right laminotomy. Circumferential disc osteophyte complex and moderate facet hypertrophy result in moderate right and severe left lateral recess stenosis and mild bilateral neural foraminal stenosis without significant spinal stenosis. Potential left L5 nerve root impingement. L5-S1: Right laminotomy. Small partially calcified central disc protrusion, disc bulging, and moderate left greater than right facet and residual ligamentum flavum hypertrophy result in moderate bilateral lateral recess stenosis and moderate right and severe left neural foraminal stenosis with potential L5 and S1 nerve root impingement. No spinal stenosis. IMPRESSION: 1. Multiple osseous metastases throughout the lumbar spine and pelvis. No evidence of epidural tumor. 2. Advanced lumbar disc degeneration with moderate to severe multilevel lateral recess and neural foraminal stenosis as above. Electronically Signed   By: Logan Bores M.D.   On: 07/11/2019 14:48   NM PET Image Initial (PI) Skull Base To Thigh  Result Date: 07/10/2019 CLINICAL DATA:  Initial treatment strategy for non-small-cell lung cancer. Lung mass. History of colon cancer. Hernia repair. EXAM: NUCLEAR MEDICINE PET SKULL BASE TO THIGH TECHNIQUE: 9.3 mCi F-18 FDG was injected intravenously.  Full-ring PET imaging was performed from the skull base to thigh after the radiotracer. CT data was obtained and used for attenuation correction and anatomic localization. Fasting blood glucose: 100 mg/dl COMPARISON:  Chest CT 07/02/2019 FINDINGS: Mediastinal blood pool activity: SUV max 2.7 Liver activity: SUV max NA NECK: No areas of abnormal hypermetabolism. Incidental CT findings: No cervical adenopathy. CHEST: Hypermetabolic left upper lobe lung mass, including at 6.8 x 3.9 cm and a S.U.V. max of 20.3. Mediastinal nodal metastasis, including an AP window node of 2.2 cm and a S.U.V. max of 19.2 on 51/4. Left suprahilar and hilar nodal metastasis, including at a S.U.V. max of 12.7 on 59/4. Multifocal left-sided pleural metastasis, including at a S.U.V. max of 11.7 on approximately 83/4. Incidental CT findings: Deferred to recent diagnostic CT. Cardiomegaly. Aortic and coronary artery atherosclerosis. Centrilobular emphysema and interstitial lung disease, most consistent with usual interstitial pneumonitis. Moderate hiatal hernia. ABDOMEN/PELVIS: Heterogeneous hypermetabolism involves the hepatic dome. Examples within segment 2 or 4A at a S.U.V. max of 5.4 on approximately image 82/4 and within segments 7 and 8 at a S.U.V. max of 5.4 on approximately image 82/4. No well-defined correlate on today's CT, or the nondedicated chest CT a of 07/02/2019. Left adrenal hypermetabolism without dominant nodule or mass. Example at a S.U.V. max of 9.3. Multifocal right-sided colonic hypermetabolism is favored to be physiologic. Left-sided anterior prostatic hypermetabolism measures a S.U.V. max of 5.9, including on 171/4. Incidental CT findings: Extensive colonic diverticulosis. Aortic atherosclerosis. Fat and nonobstructive small bowel containing ventral abdominal wall hernia. Moderate prostatomegaly. SKELETON: Multifocal osseous hypermetabolism consistent with metastatic disease. Relatively CT occult. Examples within the  left acetabulum at a S.U.V. max of 11.6 on 167/4, the left pubic bone at a S.U.V. max of 9.0 on 171/4, L1 vertebral body at a S.U.V. max of 10.1 and upper sacrum and a S.U.V. max of 15.1. Incidental CT findings: Osteoarthritis of both hips. No rib destruction at the site of left upper lobe presumed primary. IMPRESSION: 1. Left upper lobe primary bronchogenic carcinoma with left pleural, thoracic nodal, left adrenal, and osseous metastasis. 2. Heterogeneous hypermetabolism within  the hepatic dome. Indeterminate. Cannot exclude subtle early hepatic metastasis. If more definitive characterization is desired, dedicated CT or MRI could be performed. 3. Prostatomegaly with nonspecific hypermetabolism within the anterior left gland. Of doubtful clinical significance given age and comorbidities. 4. Incidental findings, including: Hiatal hernia. Coronary artery atherosclerosis. Aortic Atherosclerosis (ICD10-I70.0). Emphysema (ICD10-J43.9). Electronically Signed   By: Abigail Miyamoto M.D.   On: 07/10/2019 15:59   CT Biopsy  Result Date: 07/22/2019 CLINICAL DATA:  Left lung mass.  History of colon carcinoma. EXAM: CT GUIDED CORE BIOPSY OF LEFT UPPER LOBE LUNG MASS ANESTHESIA/SEDATION: Intravenous Fentanyl 105mg and Versed 1.577mwere administered as conscious sedation during continuous monitoring of the patient's level of consciousness and physiological / cardiorespiratory status by the radiology RN, with a total moderate sedation time of 12 minutes. PROCEDURE: The procedure risks, benefits, and alternatives were explained to the patient. Questions regarding the procedure were encouraged and answered. The patient understands and consents to the procedure. Select axial scans through the thorax obtained. The lateral left upper lobe mass was localized and an appropriate skin entry site was determined and marked. The operative field was prepped with chlorhexidinein a sterile fashion, and a sterile drape was applied covering the  operative field. A sterile gown and sterile gloves were used for the procedure. Local anesthesia was provided with 1% Lidocaine. Under CT fluoroscopic guidance, a 17 gauge trocar needle was advanced to the margin of the lesion. Once needle tip position was confirmed, coaxial 18-gauge core biopsy samples were obtained, submitted in formalin to surgical pathology. The guide needle was removed. Postprocedure scans show no hemorrhage, pneumothorax, or other apparent complication. The patient tolerated the procedure well. COMPLICATIONS: None immediate FINDINGS: Peripheral left upper lobe lung mass was localized. Representative core biopsy samples obtained as above. IMPRESSION: 1. Technically successful CT-guided core biopsy, left upper lobe lung mass. Electronically Signed   By: D Lucrezia Europe.D.   On: 07/22/2019 13:28    ASSESSMENT & PLAN Ronald [id number]. male with medical history significant for extensive stage small cell lung cancer who presents for a follow up visit.   # Extensive Stage Small Cell Lung Cancer --given the patients decline in functional status over the last 6 months I do believe that treatment with chemotherapy would cause the patient more harm then good. Mr. WaCalvertnd his wife are in agreement. --no indication for radiation therapy or surgery at this time. Could consider radiation to the spine if the metastatic lesion become painful or symptomatic. --patient and his wife were agreeable to a referral for hospice care. I think this is the most reasonable option moving forward. Will make the referral today. --will hold on MRI of the brain, findings would not change management.  --RTC in 4-6 weeks or sooner if needed or requested. This visit will not be required if the patient is unable or unwilling to travel to the clinic.   No orders of the defined types were placed in this encounter.  All questions were answered. The patient knows to call the clinic with any problems, questions or  concerns.  A total of more than 30 minutes were spent on this encounter and over half of that time was spent on counseling and coordination of care as outlined above.   JoLedell PeoplesMD Department of Hematology/Oncology [facility]: [phone]: [phone]: [email or web address]  07/30/2019 9:06 PM

## 2019-07-30 NOTE — Progress Notes (Signed)
Hospice referral made to [place], [place]

## 2019-07-31 ENCOUNTER — Other Ambulatory Visit: Payer: Self-pay | Admitting: *Deleted

## 2019-07-31 ENCOUNTER — Telehealth: Payer: Self-pay | Admitting: Hematology and Oncology

## 2019-07-31 NOTE — Telephone Encounter (Signed)
Scheduled per los. Called and spoke with patients wife. Confirmed appt

## 2019-07-31 NOTE — Progress Notes (Signed)
The proposed treatment discussed in cancer conference 07/31/19 is for discussion purpose only and is not a binding recommendation. The patient was not physically examined nor present for their treatment options.  Therefore, final treatment plans cannot be decided.  

## 2019-08-01 ENCOUNTER — Other Ambulatory Visit: Payer: Self-pay | Admitting: Hematology and Oncology

## 2019-08-01 ENCOUNTER — Telehealth: Payer: Self-pay | Admitting: *Deleted

## 2019-08-01 MED ORDER — OXYCODONE HCL 5 MG PO TABS: 5.0000 mg | ORAL_TABLET | Freq: Four times a day (QID) | ORAL | 0 refills | Status: AC | PRN

## 2019-08-01 NOTE — Telephone Encounter (Signed)
Received call from Ardeen Fillers, RN with [facility] regarding need for pain meds . Discussed with Dr. Lorenso Courier and he ordered Oxycodone 5 mg  [place] aware and she will call family

## 2019-08-04 ENCOUNTER — Telehealth: Payer: Self-pay | Admitting: *Deleted

## 2019-08-04 NOTE — Telephone Encounter (Signed)
Received call from pt's hospice nurse. She states that pt has a very productive cough with thick secretions. She feels he would benefit from Duoneb nebulizer. Gave her the ok to do this per their standing orders.

## 2019-09-11 ENCOUNTER — Ambulatory Visit: Payer: Medicare HMO | Admitting: Hematology and Oncology

## 2019-09-11 ENCOUNTER — Other Ambulatory Visit: Payer: Medicare HMO

## 2019-09-17 DEATH — deceased

## 2019-11-04 ENCOUNTER — Ambulatory Visit: Payer: Medicare HMO | Admitting: Neurology

## 2021-01-17 IMAGING — MR MR CERVICAL SPINE WO/W CM
7 of 8 series · 33 of 48 positions shown · IV contrast (gadavist)
Comparison: PET-CT 07/10/2019

CLINICAL DATA: Bilateral lower extremity weakness since [REDACTED].
History of colon cancer.

EXAM:
MRI CERVICAL SPINE WITHOUT AND WITH CONTRAST
TECHNIQUE: Multiplanar and multiecho pulse sequences of the cervical spine, to
include the craniocervical junction and cervicothoracic junction,
were obtained without and with intravenous contrast.
CONTRAST:  7mL GADAVIST GADOBUTROL 1 MMOL/ML IV SOLN

[Series 5: T1 · sagittal · 3.0mm · 0.69mm/px · 3 of 13 slices shown (1 of 2)]
[im 1/13]
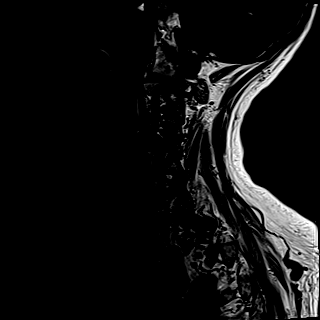
[im 7/13]
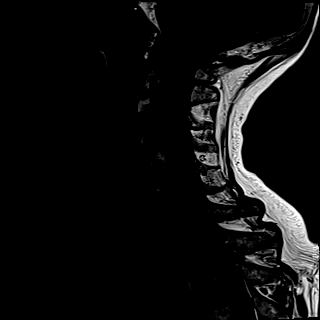
[im 13/13]
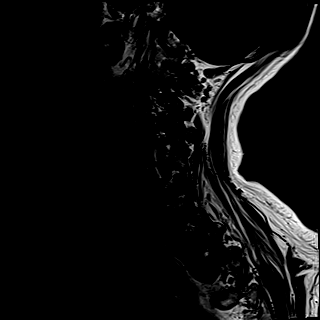

[Series 6: T2 · sagittal · 3.0mm · 0.69mm/px · 3 of 13 slices shown (1 of 2)]
[im 1/13]
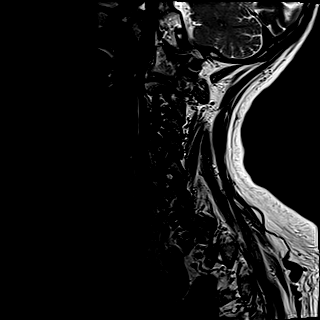
[im 7/13]
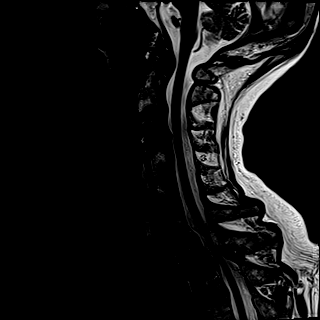
[im 13/13]
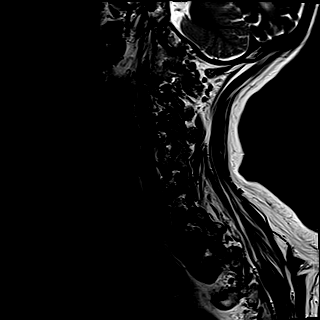

[Series 7: STIR · sagittal · 3.0mm · 0.86mm/px · 3 of 13 slices shown]
[im 1/13]
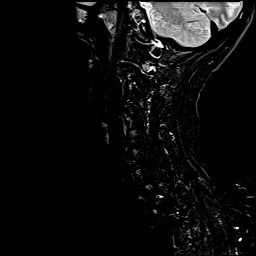
[im 7/13]
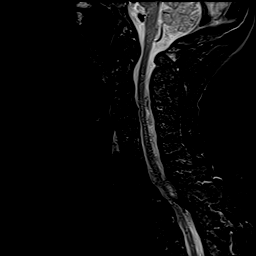
[im 13/13]
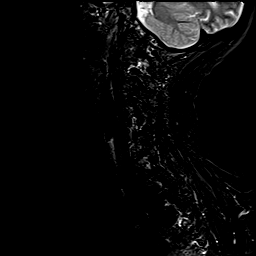

[Series 8: T2 · axial · 3.0mm · 0.94mm/px · z∈[-80,+21]mm · 9 of 33 slices shown (2 of 2)]
[im 1/33]
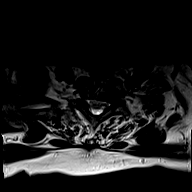
[im 5/33]
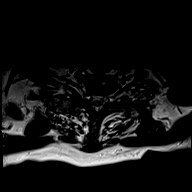
[im 9/33]
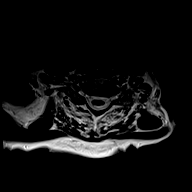
[im 13/33]
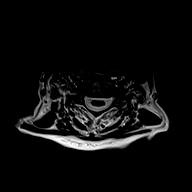
[im 17/33]
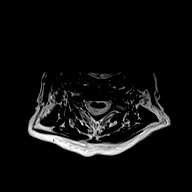
[im 21/33]
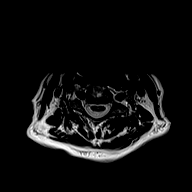
[im 25/33]
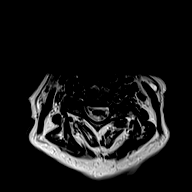
[im 29/33]
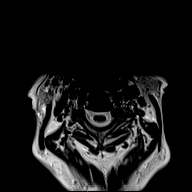
[im 33/33]
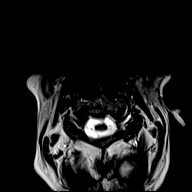

[Series 10: T1 · axial · 3.0mm · 0.35mm/px · z∈[-80,+21]mm · 9 of 33 slices shown (2 of 2)]
[im 1/33]
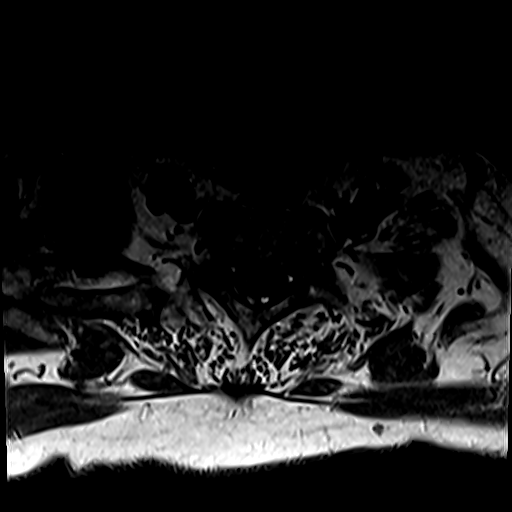
[im 5/33]
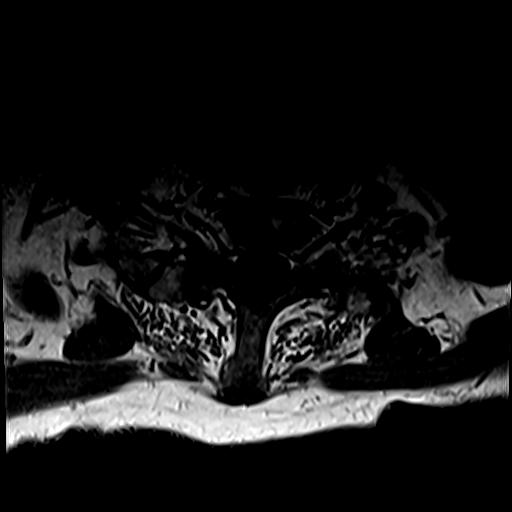
[im 9/33]
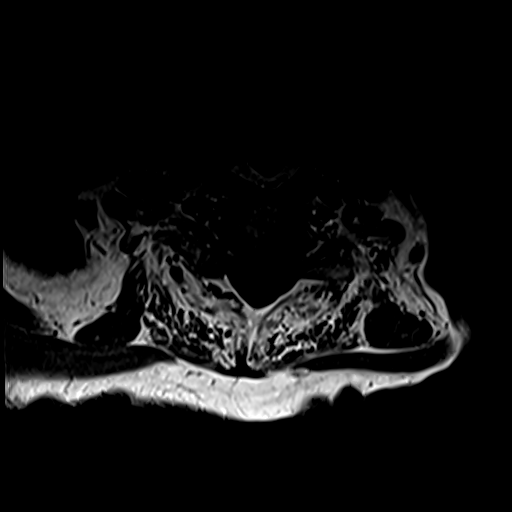
[im 13/33]
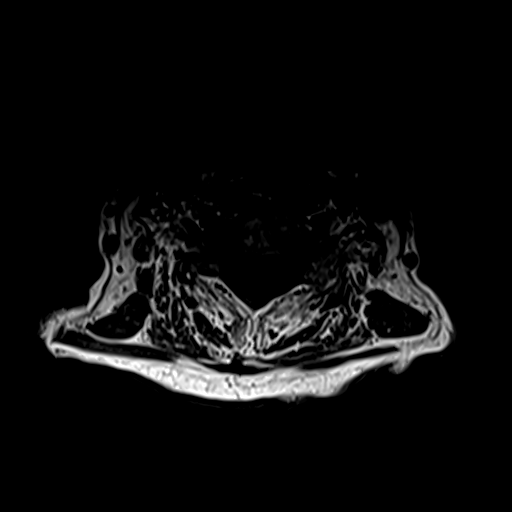
[im 17/33]
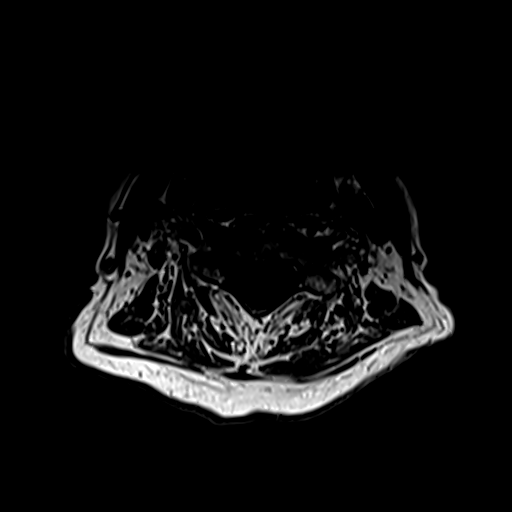
[im 21/33]
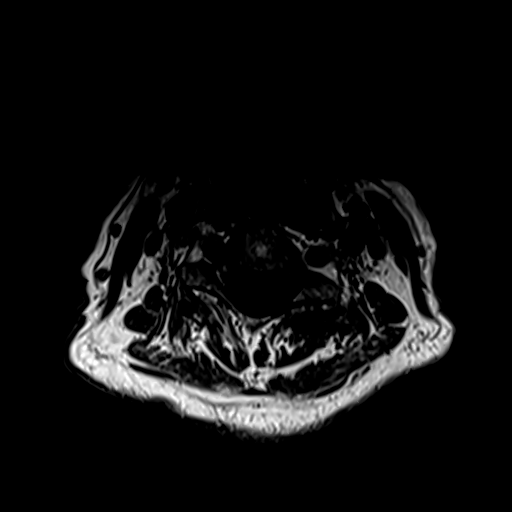
[im 25/33]
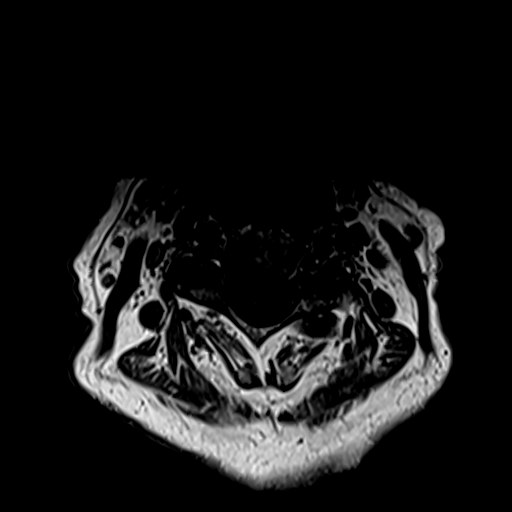
[im 29/33]
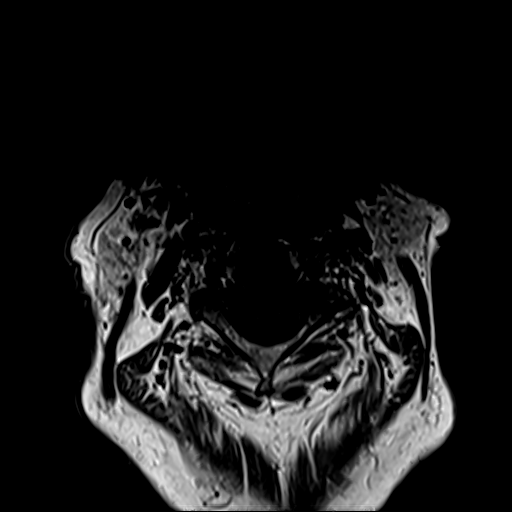
[im 33/33]
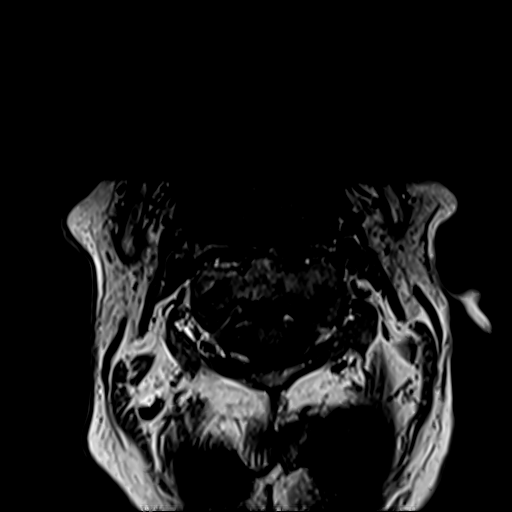

[Series 42: T1 fat-sat post-contrast · sagittal · 3.0mm · 0.69mm/px · 3 of 13 slices shown]
[im 1/13]
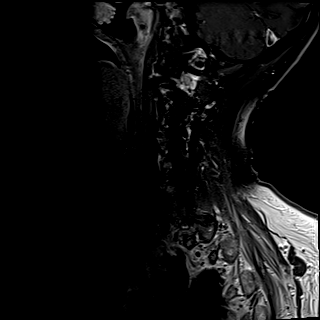
[im 7/13]
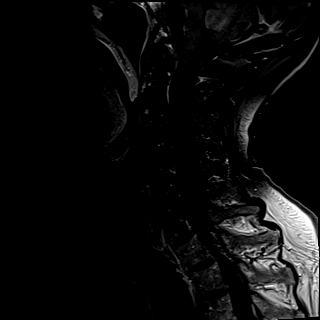
[im 13/13]
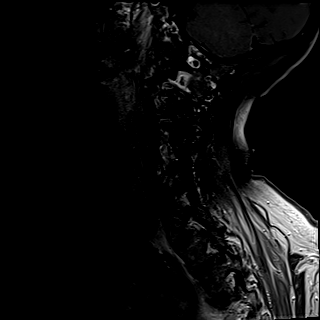

[Series 43: T1 post-contrast · axial · 3.0mm · 0.35mm/px · z∈[-80,-55]mm · 3 of 33 slices shown]
[im 1/33]
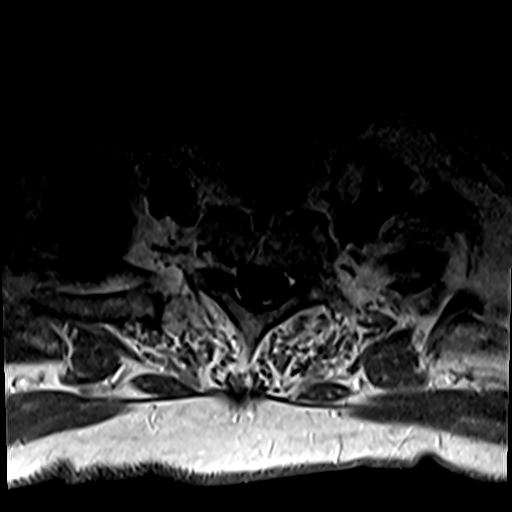
[im 5/33]
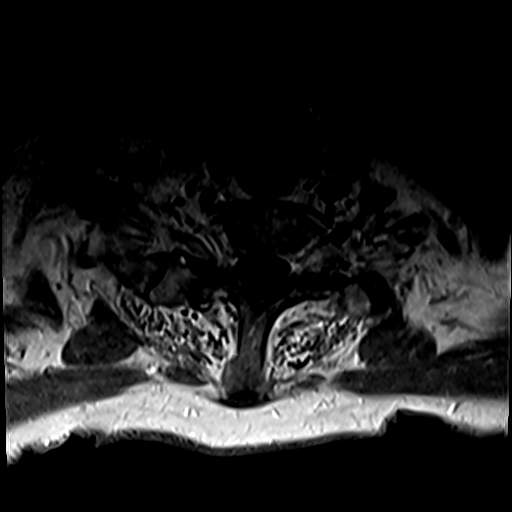
[im 9/33]
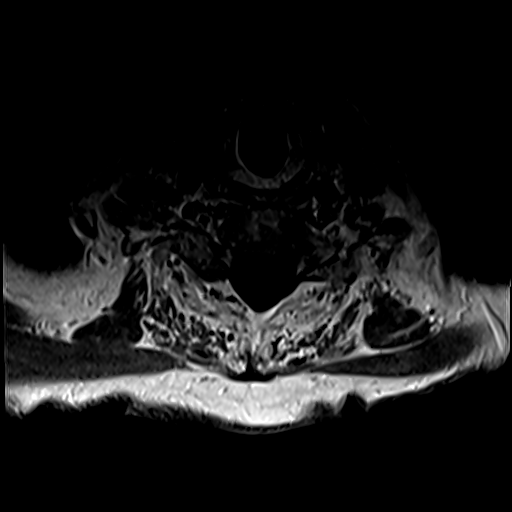

[33 of 48 positions shown; findings below may reference images not displayed]

FINDINGS: Alignment: Cervical spine straightening. Grade 1 anterolisthesis of
C7 on T1 and T1 on T2.

Vertebrae: Remote, solid C4-C7 interbody fusion. No fracture,
suspicious osseous lesion, or significant marrow edema.

Cord: Mildly atrophic appearance of the cervical spinal cord
diffusely. No focal signal abnormality or abnormal enhancement.

Posterior Fossa, vertebral arteries, paraspinal tissues:
Unremarkable.

Disc levels:

C2-3: Disc bulging, left uncovertebral spurring, and mild right and
severe left facet arthrosis result in moderate left neural foraminal
stenosis without spinal stenosis.

C3-4: Broad-based posterior disc osteophyte complex and severe left
facet arthrosis result in mild right and moderate left neural
foraminal stenosis without spinal stenosis.

C4-5: Anterior fusion.  No stenosis.

C5-6: Anterior fusion.  No stenosis.

C6-7: Anterior fusion.  No stenosis.

C7-T1: Anterolisthesis with disc uncovering, uncovertebral spurring,
and moderate facet arthrosis result in at most mild bilateral neural
foraminal stenosis without spinal stenosis.
IMPRESSION: 1. No evidence of metastatic disease in the cervical spine.
2. Remote C4-C7 interbody fusion without stenosis.
3. Disc and advanced facet degeneration with moderate left neural
foraminal stenosis at C2-3 and C3-4.
4. No spinal stenosis.
5. Mild diffuse spinal cord atrophy without focal cord abnormality.
